# Patient Record
Sex: Female | Born: 1945 | Race: White | Hispanic: No | Smoking: Never smoker
Health system: Southern US, Community
[De-identification: ages and names within clinical notes are randomized; demographics above are authoritative.]

## PROBLEM LIST (undated history)

## (undated) DIAGNOSIS — G4700 Insomnia, unspecified: Secondary | ICD-10-CM

## (undated) DIAGNOSIS — I4891 Unspecified atrial fibrillation: Secondary | ICD-10-CM

## (undated) DIAGNOSIS — J45909 Unspecified asthma, uncomplicated: Secondary | ICD-10-CM

## (undated) DIAGNOSIS — R350 Frequency of micturition: Secondary | ICD-10-CM

## (undated) DIAGNOSIS — F32A Depression, unspecified: Secondary | ICD-10-CM

## (undated) DIAGNOSIS — E119 Type 2 diabetes mellitus without complications: Secondary | ICD-10-CM

## (undated) DIAGNOSIS — Z9989 Dependence on other enabling machines and devices: Secondary | ICD-10-CM

## (undated) DIAGNOSIS — I10 Essential (primary) hypertension: Secondary | ICD-10-CM

## (undated) DIAGNOSIS — R609 Edema, unspecified: Secondary | ICD-10-CM

## (undated) DIAGNOSIS — H9190 Unspecified hearing loss, unspecified ear: Secondary | ICD-10-CM

## (undated) DIAGNOSIS — N289 Disorder of kidney and ureter, unspecified: Secondary | ICD-10-CM

## (undated) DIAGNOSIS — K219 Gastro-esophageal reflux disease without esophagitis: Secondary | ICD-10-CM

## (undated) DIAGNOSIS — M109 Gout, unspecified: Secondary | ICD-10-CM

## (undated) DIAGNOSIS — J449 Chronic obstructive pulmonary disease, unspecified: Secondary | ICD-10-CM

## (undated) DIAGNOSIS — E7800 Pure hypercholesterolemia, unspecified: Secondary | ICD-10-CM

## (undated) DIAGNOSIS — M51369 Other intervertebral disc degeneration, lumbar region without mention of lumbar back pain or lower extremity pain: Secondary | ICD-10-CM

## (undated) DIAGNOSIS — N183 Chronic kidney disease, stage 3 unspecified: Secondary | ICD-10-CM

## (undated) DIAGNOSIS — F329 Major depressive disorder, single episode, unspecified: Secondary | ICD-10-CM

## (undated) DIAGNOSIS — E1140 Type 2 diabetes mellitus with diabetic neuropathy, unspecified: Secondary | ICD-10-CM

## (undated) DIAGNOSIS — I509 Heart failure, unspecified: Secondary | ICD-10-CM

## (undated) DIAGNOSIS — G4733 Obstructive sleep apnea (adult) (pediatric): Secondary | ICD-10-CM

## (undated) DIAGNOSIS — M5136 Other intervertebral disc degeneration, lumbar region: Secondary | ICD-10-CM

## (undated) DIAGNOSIS — M1990 Unspecified osteoarthritis, unspecified site: Secondary | ICD-10-CM

## (undated) HISTORY — PX: TONSILLECTOMY: SUR1361

## (undated) HISTORY — PX: ABDOMINAL HYSTERECTOMY: SHX81

---

## 1978-01-01 HISTORY — PX: ROTATOR CUFF REPAIR: SHX139

## 2007-10-13 ENCOUNTER — Encounter: Admission: RE | Admit: 2007-10-13 | Discharge: 2007-10-13 | Payer: Self-pay | Admitting: Orthopedic Surgery

## 2007-10-30 ENCOUNTER — Observation Stay (HOSPITAL_COMMUNITY): Admission: AD | Admit: 2007-10-30 | Discharge: 2007-11-01 | Payer: Self-pay | Admitting: Orthopedic Surgery

## 2007-10-30 ENCOUNTER — Other Ambulatory Visit: Payer: Self-pay | Admitting: Orthopedic Surgery

## 2010-05-16 NOTE — Op Note (Signed)
NAMECYERRA, Elaine Lee           ACCOUNT NO.:  [id number]   MEDICAL RECORD NO.:  [id number]          PATIENT TYPE:  AMB   LOCATION:  [facility]                         FACILITY:  [facility]   PHYSICIAN:  Deidre Ala, M.D.    DATE OF BIRTH:  [birth year]   DATE OF PROCEDURE:  10/30/2007  DATE OF DISCHARGE:                               OPERATIVE REPORT   PREOPERATIVE DIAGNOSES:  1. Left shoulder impingement with type III-IV acromion.  2. Complete rotator cuff tear by MRI.  3. Osteoarthritis AC (acromioclavicular) joint.   POSTOPERATIVE DIAGNOSES:  1. Left shoulder impingement with type III-IV acromion.  2. Complete retracted rotator cuff tear nonrepairable.  3. Torn frayed biceps tendon 50%.  4. Osteoarthritis AC joint.   PROCEDURES:  1. Left shoulder operative arthroscopy with subacromial arch      decompression acromioplasty.  2. Extensive debridement rotator cuff with biceps tenotomy and      synovectomy.  3. Arthroscopic distal clavicle resection.  4. Tuberosity plasty.   SURGEON:  1. Charlesetta Shanks, M.D.   ASSISTANT:  Phineas Semen, P.A.   ANESTHESIA:  General with scalene block with endotracheal.   CULTURES:  None.   DRAINS:  None.   ESTIMATED BLOOD LOSS:  Minimal.   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  She has had increasing shoulder pain.  She is grossly  overweight, high body mass index.  She basically went straight to an MRI  scan which showed completely retracted rotator cuff tear large full  thickness retracted with associated fatty atrophy of the supraspinatus  and infraspinatus muscles.  Posterior labral tear with a high-grade  proximal tear of the subscapularis tendon with mild subluxation of the  biceps tendon but no tear of the tendon.  Therefore she was elected to  take to the operating room for a thorough debridement as she was  miserable with discomfort.  She had a sharp anterior acromion.  At  surgery we found a  frayed biceps tendon 50% which we felt was a pain  generator, no SLAP detachment, grade I-II glenohumeral DJD.  She had a  completely retracted nonrepairable rotator cuff tear, sharp craggy  anterior acromion with obviously arthritic AC joint.  We debrided her to  [facility] margins on the acromion, distal clavicle, debrided the rotator  cuff edges, did a biceps tenotomy at the level of the labrum and overall  did a thorough debridement with tuberosity plasty.  She is not able to  function well with her shoulder being so painful.   PROCEDURE IN DETAIL:  With adequate anesthesia obtained using  endotracheal technique and scalene block the patient was placed in the  supine beach chair position.  The left shoulder was prepped and draped  in the standard fashion.  After standard prepping and draping skin  markings were made for anatomic positioning and 20 mL of 0.5% Marcaine  was injected in the subacromial space to open it up.  This was with  epinephrine.  The shoulder was then entered through a posterior portal.  Anterior portal was established just lateral to the  coracoid.  I then  probed and debrided the biceps tendon and then cut it with meniscus  scissors at the level of the labrum and allowed it to retract into the  groove.  I then thoroughly debrided the undersurface of the rotator  cuff, the superior labrum and smoothed with the ablator as well as the  anterior labrum and glenohumeral surface with the ablator on 1.  Portals  were reversed and similar shavings carried out.  I then entered the  subacromial space from the posterior portal.  Anterolateral portal was  established.  I then identified and shaved the anterior undersurface of  the acromion and used the ablator to cauterize.  I brought in the 6.0  bur and completed the acromioplasty to the roof of the subacromial space  in the manner of Caspari.  Bleeding points were cauterized which was  fairly heavy bleeding due to her  hypertension and inflammation.  The  scope was then turned medially sideways where through the anterior  portal I debrided with a basket the AC meniscus and completed distal  clavicle resection two shaver breadths in with a rotary bur.  I then  entered the shoulder through a lateral portal.  An additional lateral  posterolateral portal was made.  Debridement of the anterior leaf of the  subscapularis was then carried out as well as posteriorly with a partial  tuberosity plasty carried out.  Thorough irrigation was carried out.  The shoulder was then irrigated through the scope.  The portals were  closed with 4-0 nylon.  A bulky sterile compressive dressing was applied  with sling.  The patient then having tolerated the procedure well was  awakened and taken to the recovery room in satisfactory condition to be  discharged per outpatient routine and given Vicodin for pain due to  Percocet allergy and told to call the office for appointment for recheck  tomorrow.      Deidre Ala, M.D.  Electronically Signed     VEP/MEDQ  D:  10/30/2007  T:  10/30/2007  Job:  [id number]   cc:   Drucie Opitz  Fax: [phone]

## 2010-10-03 LAB — GLUCOSE, CAPILLARY
Glucose-Capillary: 109 — ABNORMAL HIGH
Glucose-Capillary: 138 — ABNORMAL HIGH
Glucose-Capillary: 230 — ABNORMAL HIGH
Glucose-Capillary: 258 — ABNORMAL HIGH

## 2010-10-03 LAB — POCT I-STAT 4, (NA,K, GLUC, HGB,HCT)
Glucose, Bld: 210 — ABNORMAL HIGH
Hemoglobin: 12.2
Potassium: 4.9

## 2014-06-13 ENCOUNTER — Inpatient Hospital Stay (HOSPITAL_COMMUNITY)
Admission: EM | Admit: 2014-06-13 | Discharge: 2014-06-16 | DRG: 190 | Disposition: A | Discharge status: Home / Self Care | Payer: Medicare (Managed Care) | Attending: Internal Medicine | Admitting: Internal Medicine

## 2014-06-13 ENCOUNTER — Emergency Department (HOSPITAL_COMMUNITY): Payer: Medicare (Managed Care)

## 2014-06-13 ENCOUNTER — Encounter (HOSPITAL_COMMUNITY): Payer: Self-pay

## 2014-06-13 DIAGNOSIS — E876 Hypokalemia: Secondary | ICD-10-CM | POA: Diagnosis present

## 2014-06-13 DIAGNOSIS — E1142 Type 2 diabetes mellitus with diabetic polyneuropathy: Secondary | ICD-10-CM | POA: Diagnosis present

## 2014-06-13 DIAGNOSIS — G2581 Restless legs syndrome: Secondary | ICD-10-CM

## 2014-06-13 DIAGNOSIS — Z794 Long term (current) use of insulin: Secondary | ICD-10-CM

## 2014-06-13 DIAGNOSIS — I509 Heart failure, unspecified: Secondary | ICD-10-CM | POA: Diagnosis present

## 2014-06-13 DIAGNOSIS — E1122 Type 2 diabetes mellitus with diabetic chronic kidney disease: Secondary | ICD-10-CM | POA: Diagnosis present

## 2014-06-13 DIAGNOSIS — G4733 Obstructive sleep apnea (adult) (pediatric): Secondary | ICD-10-CM | POA: Diagnosis present

## 2014-06-13 DIAGNOSIS — N179 Acute kidney failure, unspecified: Secondary | ICD-10-CM | POA: Diagnosis present

## 2014-06-13 DIAGNOSIS — M109 Gout, unspecified: Secondary | ICD-10-CM | POA: Diagnosis present

## 2014-06-13 DIAGNOSIS — F419 Anxiety disorder, unspecified: Secondary | ICD-10-CM | POA: Diagnosis present

## 2014-06-13 DIAGNOSIS — Z9981 Dependence on supplemental oxygen: Secondary | ICD-10-CM | POA: Diagnosis not present

## 2014-06-13 DIAGNOSIS — E785 Hyperlipidemia, unspecified: Secondary | ICD-10-CM | POA: Diagnosis present

## 2014-06-13 DIAGNOSIS — T380X5A Adverse effect of glucocorticoids and synthetic analogues, initial encounter: Secondary | ICD-10-CM | POA: Diagnosis not present

## 2014-06-13 DIAGNOSIS — Z9119 Patient's noncompliance with other medical treatment and regimen: Secondary | ICD-10-CM | POA: Diagnosis present

## 2014-06-13 DIAGNOSIS — J441 Chronic obstructive pulmonary disease with (acute) exacerbation: Secondary | ICD-10-CM | POA: Diagnosis present

## 2014-06-13 DIAGNOSIS — J9621 Acute and chronic respiratory failure with hypoxia: Secondary | ICD-10-CM | POA: Diagnosis present

## 2014-06-13 DIAGNOSIS — R0602 Shortness of breath: Secondary | ICD-10-CM | POA: Diagnosis present

## 2014-06-13 DIAGNOSIS — Z6841 Body Mass Index (BMI) 40.0 and over, adult: Secondary | ICD-10-CM | POA: Diagnosis not present

## 2014-06-13 DIAGNOSIS — E780 Pure hypercholesterolemia: Secondary | ICD-10-CM | POA: Diagnosis present

## 2014-06-13 DIAGNOSIS — K219 Gastro-esophageal reflux disease without esophagitis: Secondary | ICD-10-CM | POA: Diagnosis present

## 2014-06-13 DIAGNOSIS — R0600 Dyspnea, unspecified: Secondary | ICD-10-CM | POA: Diagnosis not present

## 2014-06-13 DIAGNOSIS — Z886 Allergy status to analgesic agent status: Secondary | ICD-10-CM | POA: Diagnosis not present

## 2014-06-13 DIAGNOSIS — K5900 Constipation, unspecified: Secondary | ICD-10-CM | POA: Diagnosis present

## 2014-06-13 DIAGNOSIS — N189 Chronic kidney disease, unspecified: Secondary | ICD-10-CM

## 2014-06-13 DIAGNOSIS — Z882 Allergy status to sulfonamides status: Secondary | ICD-10-CM | POA: Diagnosis not present

## 2014-06-13 DIAGNOSIS — R32 Unspecified urinary incontinence: Secondary | ICD-10-CM

## 2014-06-13 DIAGNOSIS — N183 Chronic kidney disease, stage 3 unspecified: Secondary | ICD-10-CM | POA: Diagnosis present

## 2014-06-13 DIAGNOSIS — I4891 Unspecified atrial fibrillation: Secondary | ICD-10-CM

## 2014-06-13 DIAGNOSIS — Z91040 Latex allergy status: Secondary | ICD-10-CM

## 2014-06-13 DIAGNOSIS — G4700 Insomnia, unspecified: Secondary | ICD-10-CM | POA: Diagnosis present

## 2014-06-13 DIAGNOSIS — H9190 Unspecified hearing loss, unspecified ear: Secondary | ICD-10-CM | POA: Diagnosis present

## 2014-06-13 DIAGNOSIS — R609 Edema, unspecified: Secondary | ICD-10-CM

## 2014-06-13 DIAGNOSIS — R300 Dysuria: Secondary | ICD-10-CM | POA: Diagnosis present

## 2014-06-13 DIAGNOSIS — M1990 Unspecified osteoarthritis, unspecified site: Secondary | ICD-10-CM | POA: Diagnosis present

## 2014-06-13 DIAGNOSIS — Z7901 Long term (current) use of anticoagulants: Secondary | ICD-10-CM

## 2014-06-13 DIAGNOSIS — I129 Hypertensive chronic kidney disease with stage 1 through stage 4 chronic kidney disease, or unspecified chronic kidney disease: Secondary | ICD-10-CM | POA: Diagnosis present

## 2014-06-13 DIAGNOSIS — Z888 Allergy status to other drugs, medicaments and biological substances status: Secondary | ICD-10-CM

## 2014-06-13 DIAGNOSIS — F329 Major depressive disorder, single episode, unspecified: Secondary | ICD-10-CM | POA: Diagnosis present

## 2014-06-13 DIAGNOSIS — J45909 Unspecified asthma, uncomplicated: Secondary | ICD-10-CM | POA: Diagnosis present

## 2014-06-13 DIAGNOSIS — E119 Type 2 diabetes mellitus without complications: Secondary | ICD-10-CM

## 2014-06-13 DIAGNOSIS — I10 Essential (primary) hypertension: Secondary | ICD-10-CM | POA: Diagnosis present

## 2014-06-13 DIAGNOSIS — M792 Neuralgia and neuritis, unspecified: Secondary | ICD-10-CM | POA: Diagnosis present

## 2014-06-13 DIAGNOSIS — Z950 Presence of cardiac pacemaker: Secondary | ICD-10-CM

## 2014-06-13 DIAGNOSIS — F418 Other specified anxiety disorders: Secondary | ICD-10-CM

## 2014-06-13 HISTORY — DX: Gastro-esophageal reflux disease without esophagitis: K21.9

## 2014-06-13 HISTORY — DX: Disorder of kidney and ureter, unspecified: N28.9

## 2014-06-13 HISTORY — DX: Unspecified asthma, uncomplicated: J45.909

## 2014-06-13 HISTORY — DX: Edema, unspecified: R60.9

## 2014-06-13 HISTORY — DX: Dependence on other enabling machines and devices: Z99.89

## 2014-06-13 HISTORY — DX: Insomnia, unspecified: G47.00

## 2014-06-13 HISTORY — DX: Other intervertebral disc degeneration, lumbar region without mention of lumbar back pain or lower extremity pain: M51.369

## 2014-06-13 HISTORY — DX: Pure hypercholesterolemia, unspecified: E78.00

## 2014-06-13 HISTORY — DX: Unspecified atrial fibrillation: I48.91

## 2014-06-13 HISTORY — DX: Gout, unspecified: M10.9

## 2014-06-13 HISTORY — DX: Essential (primary) hypertension: I10

## 2014-06-13 HISTORY — DX: Other intervertebral disc degeneration, lumbar region: M51.36

## 2014-06-13 HISTORY — DX: Depression, unspecified: F32.A

## 2014-06-13 HISTORY — DX: Chronic kidney disease, stage 3 unspecified: N18.30

## 2014-06-13 HISTORY — DX: Frequency of micturition: R35.0

## 2014-06-13 HISTORY — DX: Heart failure, unspecified: I50.9

## 2014-06-13 HISTORY — DX: Obstructive sleep apnea (adult) (pediatric): G47.33

## 2014-06-13 HISTORY — DX: Unspecified osteoarthritis, unspecified site: M19.90

## 2014-06-13 HISTORY — DX: Chronic kidney disease, stage 3 (moderate): N18.3

## 2014-06-13 HISTORY — DX: Type 2 diabetes mellitus without complications: E11.9

## 2014-06-13 HISTORY — DX: Unspecified hearing loss, unspecified ear: H91.90

## 2014-06-13 HISTORY — DX: Type 2 diabetes mellitus with diabetic neuropathy, unspecified: E11.40

## 2014-06-13 HISTORY — DX: Major depressive disorder, single episode, unspecified: F32.9

## 2014-06-13 HISTORY — DX: Chronic obstructive pulmonary disease, unspecified: J44.9

## 2014-06-13 LAB — CBC WITH DIFFERENTIAL/PLATELET
BASOS ABS: 0 10*3/uL (ref 0.0–0.1)
BASOS PCT: 0 % (ref 0–1)
EOS PCT: 6 % — AB (ref 0–5)
Eosinophils Absolute: 0.5 10*3/uL (ref 0.0–0.7)
HEMATOCRIT: 33 % — AB (ref 36.0–46.0)
Hemoglobin: 10.3 g/dL — ABNORMAL LOW (ref 12.0–15.0)
LYMPHS ABS: 3.4 10*3/uL (ref 0.7–4.0)
LYMPHS PCT: 37 % (ref 12–46)
MCH: 27.2 pg (ref 26.0–34.0)
MCHC: 31.2 g/dL (ref 30.0–36.0)
MCV: 87.3 fL (ref 78.0–100.0)
MONO ABS: 1.3 10*3/uL — AB (ref 0.1–1.0)
Monocytes Relative: 14 % — ABNORMAL HIGH (ref 3–12)
NEUTROS ABS: 4.1 10*3/uL (ref 1.7–7.7)
Neutrophils Relative %: 43 % (ref 43–77)
Platelets: 253 10*3/uL (ref 150–400)
RBC: 3.78 MIL/uL — AB (ref 3.87–5.11)
RDW: 15.6 % — ABNORMAL HIGH (ref 11.5–15.5)
WBC: 9.4 10*3/uL (ref 4.0–10.5)

## 2014-06-13 LAB — COMPREHENSIVE METABOLIC PANEL
ALK PHOS: 69 U/L (ref 38–126)
ALT: 13 U/L — ABNORMAL LOW (ref 14–54)
ANION GAP: 9 (ref 5–15)
AST: 16 U/L (ref 15–41)
Albumin: 3.4 g/dL — ABNORMAL LOW (ref 3.5–5.0)
BUN: 13 mg/dL (ref 6–20)
CO2: 34 mmol/L — ABNORMAL HIGH (ref 22–32)
Calcium: 9.5 mg/dL (ref 8.9–10.3)
Chloride: 98 mmol/L — ABNORMAL LOW (ref 101–111)
Creatinine, Ser: 1.09 mg/dL — ABNORMAL HIGH (ref 0.44–1.00)
GFR, EST AFRICAN AMERICAN: 58 mL/min — AB (ref >60)
GFR, EST NON AFRICAN AMERICAN: 50 mL/min — AB (ref >60)
Glucose, Bld: 66 mg/dL (ref 65–99)
POTASSIUM: 3 mmol/L — AB (ref 3.5–5.1)
Sodium: 141 mmol/L (ref 135–145)
Total Bilirubin: 0.4 mg/dL (ref 0.3–1.2)
Total Protein: 6.9 g/dL (ref 6.5–8.1)

## 2014-06-13 LAB — GLUCOSE, CAPILLARY: Glucose-Capillary: 259 mg/dL — ABNORMAL HIGH (ref 65–99)

## 2014-06-13 LAB — I-STAT TROPONIN, ED: TROPONIN I, POC: 0 ng/mL (ref 0.00–0.08)

## 2014-06-13 LAB — BRAIN NATRIURETIC PEPTIDE: B Natriuretic Peptide: 118.3 pg/mL — ABNORMAL HIGH (ref 0.0–100.0)

## 2014-06-13 MED ORDER — INSULIN ASPART 100 UNIT/ML ~~LOC~~ SOLN
0.0000 [IU] | Freq: Three times a day (TID) | SUBCUTANEOUS | Status: DC
  Administered 2014-06-14: 7 [IU] via SUBCUTANEOUS
  Administered 2014-06-14: 9 [IU] via SUBCUTANEOUS

## 2014-06-13 MED ORDER — FUROSEMIDE 10 MG/ML IJ SOLN
60.0000 mg | Freq: Once | INTRAMUSCULAR | Status: AC
  Administered 2014-06-14: 60 mg via INTRAVENOUS
  Filled 2014-06-13: qty 6

## 2014-06-13 MED ORDER — GABAPENTIN 400 MG PO CAPS
700.0000 mg | ORAL_CAPSULE | Freq: Two times a day (BID) | ORAL | Status: DC
  Administered 2014-06-13 – 2014-06-16 (×6): 700 mg via ORAL
  Filled 2014-06-13 (×7): qty 1

## 2014-06-13 MED ORDER — METHYLPREDNISOLONE SODIUM SUCC 125 MG IJ SOLR
125.0000 mg | Freq: Once | INTRAMUSCULAR | Status: AC
  Administered 2014-06-13: 125 mg via INTRAVENOUS
  Filled 2014-06-13: qty 2

## 2014-06-13 MED ORDER — MAGNESIUM OXIDE 400 (241.3 MG) MG PO TABS
200.0000 mg | ORAL_TABLET | Freq: Every day | ORAL | Status: DC
  Administered 2014-06-14 – 2014-06-16 (×3): 200 mg via ORAL
  Filled 2014-06-13 (×3): qty 0.5

## 2014-06-13 MED ORDER — DILTIAZEM HCL ER COATED BEADS 240 MG PO CP24
240.0000 mg | ORAL_CAPSULE | Freq: Every day | ORAL | Status: DC
  Administered 2014-06-14 – 2014-06-16 (×3): 240 mg via ORAL
  Filled 2014-06-13 (×3): qty 1

## 2014-06-13 MED ORDER — PREDNISONE 20 MG PO TABS
40.0000 mg | ORAL_TABLET | Freq: Every day | ORAL | Status: DC
  Administered 2014-06-14: 40 mg via ORAL
  Filled 2014-06-13 (×2): qty 2

## 2014-06-13 MED ORDER — WHITE PETROLATUM GEL
Status: DC | PRN
  Filled 2014-06-13: qty 28.35

## 2014-06-13 MED ORDER — GUAIFENESIN-CODEINE 100-10 MG/5ML PO SYRP: 5.0000 mL | ORAL_SOLUTION | Freq: Four times a day (QID) | ORAL | Status: DC | PRN

## 2014-06-13 MED ORDER — ALBUTEROL (5 MG/ML) CONTINUOUS INHALATION SOLN
10.0000 mg/h | INHALATION_SOLUTION | Freq: Once | RESPIRATORY_TRACT | Status: AC
  Administered 2014-06-13: 10 mg/h via RESPIRATORY_TRACT
  Filled 2014-06-13: qty 20

## 2014-06-13 MED ORDER — APIXABAN 5 MG PO TABS
5.0000 mg | ORAL_TABLET | Freq: Two times a day (BID) | ORAL | Status: DC
  Administered 2014-06-13 – 2014-06-16 (×6): 5 mg via ORAL
  Filled 2014-06-13 (×7): qty 1

## 2014-06-13 MED ORDER — GUAIFENESIN-CODEINE 100-10 MG/5ML PO SOLN
5.0000 mL | Freq: Four times a day (QID) | ORAL | Status: DC | PRN
  Administered 2014-06-13 – 2014-06-14 (×3): 5 mL via ORAL
  Filled 2014-06-13 (×3): qty 5

## 2014-06-13 MED ORDER — ACETAMINOPHEN 325 MG PO TABS
975.0000 mg | ORAL_TABLET | Freq: Two times a day (BID) | ORAL | Status: DC
  Administered 2014-06-13 – 2014-06-16 (×6): 975 mg via ORAL
  Filled 2014-06-13 (×6): qty 3

## 2014-06-13 MED ORDER — BUDESONIDE-FORMOTEROL FUMARATE 160-4.5 MCG/ACT IN AERO
2.0000 | INHALATION_SPRAY | Freq: Two times a day (BID) | RESPIRATORY_TRACT | Status: DC
  Administered 2014-06-14: 2 via RESPIRATORY_TRACT
  Filled 2014-06-13: qty 6

## 2014-06-13 MED ORDER — SODIUM CHLORIDE 0.9 % IJ SOLN
3.0000 mL | Freq: Two times a day (BID) | INTRAMUSCULAR | Status: DC
  Administered 2014-06-13 – 2014-06-16 (×6): 3 mL via INTRAVENOUS

## 2014-06-13 MED ORDER — THEOPHYLLINE ER 300 MG PO TB12
300.0000 mg | ORAL_TABLET | Freq: Every day | ORAL | Status: DC
  Administered 2014-06-14 – 2014-06-16 (×3): 300 mg via ORAL
  Filled 2014-06-13 (×3): qty 1

## 2014-06-13 MED ORDER — GABAPENTIN 800 MG PO TABS: 700.0000 mg | ORAL_TABLET | Freq: Two times a day (BID) | ORAL | Status: DC

## 2014-06-13 MED ORDER — IPRATROPIUM-ALBUTEROL 0.5-2.5 (3) MG/3ML IN SOLN
3.0000 mL | RESPIRATORY_TRACT | Status: DC
  Administered 2014-06-14: 3 mL via RESPIRATORY_TRACT
  Filled 2014-06-13: qty 3

## 2014-06-13 MED ORDER — CLONAZEPAM 0.5 MG PO TABS
2.0000 mg | ORAL_TABLET | Freq: Every day | ORAL | Status: DC
  Administered 2014-06-13 – 2014-06-15 (×3): 2 mg via ORAL
  Filled 2014-06-13 (×3): qty 4

## 2014-06-13 MED ORDER — ALLOPURINOL 300 MG PO TABS
300.0000 mg | ORAL_TABLET | Freq: Every day | ORAL | Status: DC
  Administered 2014-06-14 – 2014-06-16 (×3): 300 mg via ORAL
  Filled 2014-06-13 (×3): qty 1

## 2014-06-13 MED ORDER — ATORVASTATIN CALCIUM 40 MG PO TABS
40.0000 mg | ORAL_TABLET | Freq: Every day | ORAL | Status: DC
  Administered 2014-06-13 – 2014-06-15 (×3): 40 mg via ORAL
  Filled 2014-06-13 (×4): qty 1

## 2014-06-13 MED ORDER — FLUOXETINE HCL 20 MG PO CAPS
40.0000 mg | ORAL_CAPSULE | Freq: Every day | ORAL | Status: DC
  Administered 2014-06-14 – 2014-06-16 (×3): 40 mg via ORAL
  Filled 2014-06-13 (×3): qty 2

## 2014-06-13 MED ORDER — PANTOPRAZOLE SODIUM 40 MG PO TBEC
80.0000 mg | DELAYED_RELEASE_TABLET | Freq: Every day | ORAL | Status: DC
  Administered 2014-06-14 – 2014-06-16 (×3): 80 mg via ORAL
  Filled 2014-06-13 (×3): qty 2

## 2014-06-13 MED ORDER — SODIUM CHLORIDE 0.9 % IV BOLUS (SEPSIS)
250.0000 mL | Freq: Once | INTRAVENOUS | Status: AC
  Administered 2014-06-13: 250 mL via INTRAVENOUS

## 2014-06-13 MED ORDER — TROSPIUM CHLORIDE ER 60 MG PO CP24
60.0000 mg | ORAL_CAPSULE | Freq: Every day | ORAL | Status: DC
  Administered 2014-06-16: 60 mg via ORAL

## 2014-06-13 MED ORDER — LORATADINE 10 MG PO TABS
10.0000 mg | ORAL_TABLET | Freq: Every morning | ORAL | Status: DC
  Administered 2014-06-14 – 2014-06-16 (×3): 10 mg via ORAL
  Filled 2014-06-13 (×3): qty 1

## 2014-06-13 MED ORDER — INSULIN ASPART PROT & ASPART (70-30 MIX) 100 UNIT/ML ~~LOC~~ SUSP
30.0000 [IU] | Freq: Two times a day (BID) | SUBCUTANEOUS | Status: DC
  Administered 2014-06-14: 30 [IU] via SUBCUTANEOUS
  Filled 2014-06-13: qty 10

## 2014-06-13 NOTE — ED Notes (Signed)
Transporting patient to new room assignment. 

## 2014-06-13 NOTE — H&P (Signed)
Date: 06/13/2014               Patient Name:  Elaine Lee MRN: [id number]  DOB: [birth year] Age / Sex: 69 y.o., female   PCP: No primary care provider on file.         Medical Service: Internal Medicine Teaching Service         Attending Physician: Dr. Doneen Poisson, MD    First Contact: Dr. Glenard Haring Pager: [phone]  Second Contact: Dr. Delane Ginger Pager: [phone]       After Hours (After 5p/  First Contact Pager: [phone]  weekends / holidays): Second Contact Pager: [phone]   Chief Complaint: shortness of breath  History of Present Illness: Pt is a 69 y/o F w/ PMHx of COPD, CHF, DM, OSA, atrial fibrillation on eliquis, HTN, and CKD who presents with shortness of breath. Pt states for the past 2 days she has had increased cough with yellow sputum production and SOB.  Due to cough pt has developed chest pain that she experiences only when she is coughing. SOB did not improve with her home inhalers. She has SOB all of the time but has increased worsening over the past 2 days. She has had issues with leg swelling and states swelling is now up to her thighs. Leg swelling has worsened over the past week. She has orthopnea and sleeps on a hospital bed w/ the head elevated for the past few years. She is on 3L home O2. She has hx of frequent admission for CHF exacerbations and has been seen at [facility] for this, last admission in March. She follows with Dr. Gerome Apley w/ pulmonology but has stopped seeing him since she joined [facility] in April. Pt is also complaining of dysuria, increased urinary incontinence, and foul odor to urine x 2-3 days. CXR in the ED reveals vascular congestion with mildly increased interstitial markings. She was given IV solumedrol and a breathing tx in the ED.   Meds: Current Facility-Administered Medications  Medication Dose Route Frequency Provider Last Rate Last Dose  . white petrolatum (VASELINE) gel   Topical PRN Purvis Sheffield, MD        Allergies: Allergies  as of 06/13/2014 - Review Complete 06/13/2014  Allergen Reaction Noted  . Other Shortness Of Breath 06/13/2014  . Tape Itching and Rash 06/13/2014  . Celebrex [celecoxib] Other (See Comments) 06/13/2014  . Latex Hives, Itching, and Rash 06/13/2014  . Mysoline [primidone] Other (See Comments) 06/13/2014  . Niacin and related Hives and Swelling 06/13/2014  . Sulfa antibiotics Other (See Comments) 06/13/2014  . Aspirin Other (See Comments) 06/13/2014   Past Medical History  Diagnosis Date  . Diabetes mellitus without complication   . CHF (congestive heart failure)   . Renal disorder   . COPD (chronic obstructive pulmonary disease)   . Hypertension   . Asthma   . Obstructive sleep apnea on CPAP   . A-fib   . Hypercholesteremia   . Chronic edema   . CKD (chronic kidney disease), stage III   . Diabetic neuropathy   . Degenerative lumbar disc   . Osteoarthritis     knees and shoulders  . Gout   . GERD (gastroesophageal reflux disease)   . Depression   . Hearing loss   . Urinary frequency   . Insomnia    History reviewed. No pertinent past surgical history. History reviewed. No pertinent family history. History   Social History  . Marital Status: Married  Spouse Name: N/A  . Number of Children: N/A  . Years of Education: N/A   Occupational History  . Not on file.   Social History Main Topics  . Smoking status: Never Smoker   . Smokeless tobacco: Not on file  . Alcohol Use: No  . Drug Use: Not on file  . Sexual Activity: Not on file   Other Topics Concern  . Not on file   Social History Narrative  . No narrative on file    Review of Systems  Constitutional: Positive for chills. Negative for fever.  HENT:       Sinus pressure   Respiratory: Positive for cough, shortness of breath and wheezing.   Cardiovascular: Positive for chest pain (only when coughing), orthopnea and leg swelling.  Gastrointestinal: Negative for nausea, vomiting and abdominal pain.   Genitourinary: Positive for dysuria and frequency.  Neurological: Positive for headaches.     Physical Exam: Blood pressure 125/60, pulse 113, temperature 98.2 F (36.8 C), temperature source Oral, resp. rate 20, height 5\' 5"  (1.651 m), weight 328 lb 6.4 oz (148.961 kg), SpO2 97 %.  Physical Exam  Constitutional: She appears well-developed and well-nourished. No distress.  Eyes: Conjunctivae are normal. Pupils are equal, round, and reactive to light.  Cardiovascular: Normal rate and regular rhythm.   No murmur heard. Pulmonary/Chest: She has wheezes.  Rhonchi, coarse breath sounds   Abdominal: Soft. Bowel sounds are normal.  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  Skin: Skin is warm and dry.    Lab results: Basic Metabolic Panel:  Recent Labs  09/32/67 1712  NA 141  K 3.0*  CL 98*  CO2 34*  GLUCOSE 66  BUN 13  CREATININE 1.09*  CALCIUM 9.5   Liver Function Tests:  Recent Labs  06/13/14 1712  AST 16  ALT 13*  ALKPHOS 69  BILITOT 0.4  PROT 6.9  ALBUMIN 3.4*   CBC:  Recent Labs  06/13/14 1712  WBC 9.4  NEUTROABS 4.1  HGB 10.3*  HCT 33.0*  MCV 87.3  PLT 253    CBG:  Recent Labs  06/13/14 2103  GLUCAP 259*    Imaging results:  Dg Chest 2 View  06/13/2014   CLINICAL DATA:  Acute onset of shortness of breath and cough. Initial encounter.  EXAM: CHEST  2 VIEW  COMPARISON:  Chest radiograph performed 03/30/2014  FINDINGS: The lungs are well-aerated. Vascular congestion is noted, with increased interstitial markings, likely reflecting mild interstitial edema. No definite pleural effusion or pneumothorax is seen.  The heart is mildly enlarged. No acute osseous abnormalities are seen. The patient is status post right-sided rotator cuff repair.  IMPRESSION: Vascular congestion and mild cardiomegaly, with mildly increased interstitial markings, likely reflecting mild interstitial  edema.   Electronically Signed   By: Roanna Raider M.D.   On: 06/13/2014 17:50    EKG Interpretation  Date/Time:  Sunday June 13 2014 16:23:58 EDT Ventricular Rate:  83 PR Interval:    QRS Duration: 99 QT Interval:  396 QTC Calculation: 465 R Axis:   -49 Text Interpretation:  Atrial fibrillation Left anterior fascicular block Low voltage, precordial leads Nonspecific T abnrm, anterolateral leads Confirmed by HARRISON  MD, FORREST ([id number]) on 06/13/2014 4:36:02 PM T wave flattening in V3.    Assessment & Plan by Problem: Active Problems:   COPD exacerbation   Shortness of breath--Likely a combination of mild CHF exacerbation  and COPD. CXR revealed mild interstitial edema, BNP elevated at 118.3, and pt has increased leg edema. On admission her weight was 328lbs. Pt states her normal weight is around 295, she has a home health nurse who checks her weight 3x a week but she has not seen pt since last week. Unable to auscultate crackle on lung exam due body habitus, she did have coarse breath sounds with expiratory wheezing concerning for COPD exacerbation. She has never smoked but states she was dx with COPD by Dr. Gerome Apley and referred to pulmonary rehab. States when she is at [facility] pts there may be sick but otherwise denies sick contacts in her house hold. On lasix  BID at home. She was given given albuterol neb and solumedrol  in the ED. - admit to tele - IV lasix  once - strict I/O's, daily weights - cont albuterol inhaler,symbicort, and theophylline - duonebs q4h - prednisone  daily starting tomorrow - trend troponins - ECHO - re-eval CCB use in heart failure - muccinex for cough   Dysuria-- w/ inc frequency and foul odor - UA ordered  HTN - hold losartan  daily  Atrial fibrillation - cont eliquis and cardizem  daily  DM w/ peripheral neuropathy-- on 70/30 45 units in the am and 48 units in the PM at home - 70/30 30 units BID - SSI sensitive -  gabapentin  BID which is the renal dose, at home she is on  BID - hbA1c  HLD-- cont atorvastatin  daily   CKD--GFR 50, Cr 1.09 w/ no priors to compare.  - will cont to monitor  OSA-- BIPAP qhs  Gout-- cont allopurinol  Depression, anxiety, and RLS - cont prozac , klonopin  qhs  Insomnia - hold ambien   Urinary incontinence-- cont home trospium chloride  daily  GERD-- on prilosec  at home - protonix  qd   FEN: - Diet- hh/carb mod - KDur 40 mEq x 2 doses - cont home mag oxide  daily  DVT ppx: eliquis  CODE: FULL, discussed w/ pt at bedside  Dispo: Disposition is deferred at this time, awaiting improvement of current medical problems.  The patient does have a current PCP ([facility] pt) and does not need an [facility] hospital follow-up appointment after discharge.  The patient does not have transportation limitations that hinder transportation to clinic appointments.  Signed: Denton Brick, MD 06/13/2014, 10:23 PM

## 2014-06-13 NOTE — ED Notes (Signed)
Per EMS: Pt from urgent care, did not assess pt, called EMS. Pt of [facility]. Pt complaining of increased swelling in legs x several weeks. Hx. COPD. Pt coughing up yellow phlem all night. Bilateral wheezing and leg swelling. EMS gave albuterol via neb.

## 2014-06-13 NOTE — ED Provider Notes (Signed)
CSN: [id number]     Arrival date & time 06/13/14  1533 History   First MD Initiated Contact with Patient 06/13/14 1551     Chief Complaint  Patient presents with  . Shortness of Breath     (Consider location/radiation/quality/duration/timing/severity/associated sxs/prior Treatment) Patient is a 69 y.o. female presenting with shortness of breath. The history is provided by the patient.  Shortness of Breath Severity:  Mild Onset quality:  Gradual Timing:  Constant Progression:  Worsening Chronicity:  Recurrent Context: activity and URI   Relieved by:  Nothing Worsened by:  Nothing tried Ineffective treatments:  Inhaler, rest and oxygen Associated symptoms: cough   Associated symptoms: no abdominal pain, no chest pain, no fever, no headaches, no neck pain and no vomiting   Cough:    Cough characteristics:  Productive   Sputum characteristics:  Yellow   Severity:  Mild   Past Medical History  Diagnosis Date  . Diabetes mellitus without complication   . CHF (congestive heart failure)   . Renal disorder   . COPD (chronic obstructive pulmonary disease)   . Hypertension   . Asthma   . Obstructive sleep apnea on CPAP   . A-fib   . Hypercholesteremia   . Chronic edema    History reviewed. No pertinent past surgical history. History reviewed. No pertinent family history. History  Substance Use Topics  . Smoking status: Never Smoker   . Smokeless tobacco: Not on file  . Alcohol Use: No   OB History    No data available     Review of Systems  Constitutional: Positive for chills. Negative for fever and fatigue.  HENT: Negative for congestion and drooling.   Eyes: Negative for pain.  Respiratory: Positive for cough, chest tightness and shortness of breath.   Cardiovascular: Negative for chest pain.  Gastrointestinal: Negative for nausea, vomiting, abdominal pain and diarrhea.  Genitourinary: Negative for dysuria and hematuria.  Musculoskeletal: Negative for back pain,  gait problem and neck pain.       Worsening edema  Skin: Negative for color change.  Neurological: Negative for dizziness and headaches.  Hematological: Negative for adenopathy.  Psychiatric/Behavioral: Negative for behavioral problems.  All other systems reviewed and are negative.     Allergies  Aspirin; Celebrex; Latex; Mysoline; Niacin and related; Sulfa antibiotics; and Tape  Home Medications   Prior to Admission medications   Not on File   BP 92/43 mmHg  Pulse 87  Temp(Src) 97.8 F (36.6 C)  Resp 14  SpO2 95% Physical Exam  Constitutional: She is oriented to person, place, and time. She appears well-developed and well-nourished.  HENT:  Head: Normocephalic.  Mouth/Throat: Oropharynx is clear and moist. No oropharyngeal exudate.  Eyes: Conjunctivae and EOM are normal. Pupils are equal, round, and reactive to light.  Neck: Normal range of motion. Neck supple.  Cardiovascular: Normal rate, normal heart sounds and intact distal pulses.  Exam reveals no gallop and no friction rub.   No murmur heard. afib   Pulmonary/Chest: No respiratory distress. She has wheezes (diffuse mild wheezing heard bilaterally.).  Abdominal: Soft. Bowel sounds are normal. There is no tenderness. There is no rebound and no guarding.  Musculoskeletal: Normal range of motion. She exhibits edema (moderate edema in the distal lower extremities.). She exhibits no tenderness.  2+ distal pulses in bilateral lower extremities.  Neurological: She is alert and oriented to person, place, and time.  Skin: Skin is warm and dry.  Psychiatric: She has a normal mood and  affect. Her behavior is normal.  Nursing note and vitals reviewed.   ED Course  Procedures (including critical care time) Labs Review Labs Reviewed  CBC WITH DIFFERENTIAL/PLATELET  COMPREHENSIVE METABOLIC PANEL  BRAIN NATRIURETIC PEPTIDE  I-STAT TROPOININ, ED    Imaging Review No results found.   EKG Interpretation   Date/Time:   Sunday June 13 2014 16:23:58 EDT Ventricular Rate:  83 PR Interval:    QRS Duration: 99 QT Interval:  396 QTC Calculation: 465 R Axis:   -49 Text Interpretation:  Atrial fibrillation Left anterior fascicular block  Low voltage, precordial leads Nonspecific T abnrm, anterolateral leads  Confirmed by Parveen Freehling  MD, Jaeger Trueheart ([id number]) on 06/13/2014 4:36:02 PM      MDM   Final diagnoses:  SOB (shortness of breath)  COPD exacerbation  Interstitial edema  Hypokalemia    4:43 PM 69 y.o. female w hx of DM, CHF, COPD on 2-3L [place] at baseline, afib on eliquis who presents with worsening shortness of breath over the last few days. She also notes cough productive of yellow sputum. She denies any fevers. She has had wheezing and chest tightness not relieved with her nebulizers at home. She was seen at an urgent care prior to arrival and referred here without any workup due to her multiple comorbidities. Afebrile here, blood pressure slightly soft, vital signs otherwise unremarkable. Diffuse wheezing heard. We'll get albuterol med, steroids, 250 mL bolus, screening labs and imaging.  [facility] pt. IM teaching to admit.   Purvis Sheffield, MD 06/14/14 1155

## 2014-06-14 ENCOUNTER — Inpatient Hospital Stay (HOSPITAL_COMMUNITY): Payer: Medicare (Managed Care)

## 2014-06-14 DIAGNOSIS — N183 Chronic kidney disease, stage 3 unspecified: Secondary | ICD-10-CM | POA: Insufficient documentation

## 2014-06-14 DIAGNOSIS — M792 Neuralgia and neuritis, unspecified: Secondary | ICD-10-CM | POA: Diagnosis present

## 2014-06-14 DIAGNOSIS — I509 Heart failure, unspecified: Secondary | ICD-10-CM

## 2014-06-14 DIAGNOSIS — Z9119 Patient's noncompliance with other medical treatment and regimen: Secondary | ICD-10-CM

## 2014-06-14 DIAGNOSIS — E119 Type 2 diabetes mellitus without complications: Secondary | ICD-10-CM

## 2014-06-14 DIAGNOSIS — K5900 Constipation, unspecified: Secondary | ICD-10-CM

## 2014-06-14 DIAGNOSIS — E1122 Type 2 diabetes mellitus with diabetic chronic kidney disease: Secondary | ICD-10-CM | POA: Insufficient documentation

## 2014-06-14 DIAGNOSIS — I10 Essential (primary) hypertension: Secondary | ICD-10-CM | POA: Diagnosis present

## 2014-06-14 DIAGNOSIS — R0600 Dyspnea, unspecified: Secondary | ICD-10-CM

## 2014-06-14 LAB — BASIC METABOLIC PANEL
ANION GAP: 13 (ref 5–15)
BUN: 22 mg/dL — ABNORMAL HIGH (ref 6–20)
CO2: 28 mmol/L (ref 22–32)
CREATININE: 1.43 mg/dL — AB (ref 0.44–1.00)
Calcium: 9.6 mg/dL (ref 8.9–10.3)
Chloride: 96 mmol/L — ABNORMAL LOW (ref 101–111)
GFR calc non Af Amer: 36 mL/min — ABNORMAL LOW (ref >60)
GFR, EST AFRICAN AMERICAN: 42 mL/min — AB (ref >60)
Glucose, Bld: 408 mg/dL — ABNORMAL HIGH (ref 65–99)
Potassium: 3.6 mmol/L (ref 3.5–5.1)
Sodium: 137 mmol/L (ref 135–145)

## 2014-06-14 LAB — URINALYSIS, ROUTINE W REFLEX MICROSCOPIC
Bilirubin Urine: NEGATIVE
Glucose, UA: 100 mg/dL — AB
HGB URINE DIPSTICK: NEGATIVE
Ketones, ur: NEGATIVE mg/dL
Leukocytes, UA: NEGATIVE
Nitrite: NEGATIVE
PH: 6 (ref 5.0–8.0)
Protein, ur: NEGATIVE mg/dL
Specific Gravity, Urine: 1.014 (ref 1.005–1.030)
UROBILINOGEN UA: 0.2 mg/dL (ref 0.0–1.0)

## 2014-06-14 LAB — MAGNESIUM: Magnesium: 1.8 mg/dL (ref 1.7–2.4)

## 2014-06-14 LAB — GLUCOSE, CAPILLARY
GLUCOSE-CAPILLARY: 383 mg/dL — AB (ref 65–99)
GLUCOSE-CAPILLARY: 345 mg/dL — AB (ref 65–99)
GLUCOSE-CAPILLARY: 236 mg/dL — AB (ref 65–99)
Glucose-Capillary: 344 mg/dL — ABNORMAL HIGH (ref 65–99)

## 2014-06-14 LAB — TROPONIN I
Troponin I: <0.03 ng/mL (ref <0.031)
Troponin I: <0.03 ng/mL (ref <0.031)

## 2014-06-14 MED ORDER — INSULIN ASPART PROT & ASPART (70-30 MIX) 100 UNIT/ML ~~LOC~~ SUSP
40.0000 [IU] | Freq: Two times a day (BID) | SUBCUTANEOUS | Status: DC
  Administered 2014-06-14: 40 [IU] via SUBCUTANEOUS
  Filled 2014-06-14: qty 10

## 2014-06-14 MED ORDER — IPRATROPIUM-ALBUTEROL 0.5-2.5 (3) MG/3ML IN SOLN: 3.0000 mL | RESPIRATORY_TRACT | Status: DC | PRN

## 2014-06-14 MED ORDER — CEFUROXIME AXETIL 500 MG PO TABS
500.0000 mg | ORAL_TABLET | Freq: Two times a day (BID) | ORAL | Status: DC
  Administered 2014-06-15 – 2014-06-16 (×3): 500 mg via ORAL
  Filled 2014-06-14 (×5): qty 1

## 2014-06-14 MED ORDER — BUDESONIDE 0.25 MG/2ML IN SUSP
0.2500 mg | Freq: Two times a day (BID) | RESPIRATORY_TRACT | Status: DC
  Administered 2014-06-15 – 2014-06-16 (×2): 0.25 mg via RESPIRATORY_TRACT
  Filled 2014-06-14 (×6): qty 2

## 2014-06-14 MED ORDER — POTASSIUM CHLORIDE CRYS ER 20 MEQ PO TBCR
40.0000 meq | EXTENDED_RELEASE_TABLET | Freq: Two times a day (BID) | ORAL | Status: AC
  Administered 2014-06-14 (×2): 40 meq via ORAL
  Filled 2014-06-14 (×2): qty 2

## 2014-06-14 MED ORDER — ALBUTEROL SULFATE (2.5 MG/3ML) 0.083% IN NEBU
2.5000 mg | INHALATION_SOLUTION | RESPIRATORY_TRACT | Status: DC | PRN
  Administered 2014-06-15: 2.5 mg via RESPIRATORY_TRACT
  Filled 2014-06-14: qty 3

## 2014-06-14 MED ORDER — INSULIN ASPART 100 UNIT/ML ~~LOC~~ SOLN
0.0000 [IU] | Freq: Every day | SUBCUTANEOUS | Status: DC
  Administered 2014-06-14: 2 [IU] via SUBCUTANEOUS
  Administered 2014-06-15: 3 [IU] via SUBCUTANEOUS

## 2014-06-14 MED ORDER — PREDNISONE 20 MG PO TABS
40.0000 mg | ORAL_TABLET | Freq: Every day | ORAL | Status: DC
Start: 2014-06-15 — End: 2014-06-16
  Administered 2014-06-15 – 2014-06-16 (×2): 40 mg via ORAL
  Filled 2014-06-14 (×3): qty 2

## 2014-06-14 MED ORDER — FLUTICASONE PROPIONATE 50 MCG/ACT NA SUSP
2.0000 | Freq: Every day | NASAL | Status: DC
  Administered 2014-06-14 – 2014-06-16 (×3): 2 via NASAL
  Filled 2014-06-14: qty 16

## 2014-06-14 MED ORDER — POLYETHYLENE GLYCOL 3350 17 G PO PACK
17.0000 g | PACK | Freq: Every day | ORAL | Status: DC
  Administered 2014-06-14 – 2014-06-16 (×2): 17 g via ORAL
  Filled 2014-06-14 (×3): qty 1

## 2014-06-14 MED ORDER — CEFUROXIME AXETIL 500 MG PO TABS
500.0000 mg | ORAL_TABLET | Freq: Two times a day (BID) | ORAL | Status: DC
  Administered 2014-06-14 (×2): 500 mg via ORAL
  Filled 2014-06-14 (×3): qty 1

## 2014-06-14 MED ORDER — IPRATROPIUM-ALBUTEROL 0.5-2.5 (3) MG/3ML IN SOLN
3.0000 mL | Freq: Four times a day (QID) | RESPIRATORY_TRACT | Status: DC
  Administered 2014-06-14 – 2014-06-16 (×10): 3 mL via RESPIRATORY_TRACT
  Filled 2014-06-14 (×10): qty 3

## 2014-06-14 MED ORDER — INSULIN ASPART 100 UNIT/ML ~~LOC~~ SOLN
0.0000 [IU] | Freq: Three times a day (TID) | SUBCUTANEOUS | Status: DC
  Administered 2014-06-14: 15 [IU] via SUBCUTANEOUS
  Administered 2014-06-15: 7 [IU] via SUBCUTANEOUS
  Administered 2014-06-15 (×2): 11 [IU] via SUBCUTANEOUS
  Administered 2014-06-16: 3 [IU] via SUBCUTANEOUS
  Administered 2014-06-16: 4 [IU] via SUBCUTANEOUS

## 2014-06-14 NOTE — Progress Notes (Signed)
Pt refuses to wear CPAP tonight. Encouraged to call RT if pt changes mind.

## 2014-06-14 NOTE — Progress Notes (Signed)
UR completed 

## 2014-06-14 NOTE — Progress Notes (Signed)
Internal Medicine Attending  Date: 06/14/2014  Patient name: Elaine Lee Medical record number: [id number] Date of birth: 1945/01/12 Age: 69 y.o. Gender: female  I saw and evaluated the patient. I reviewed the resident's note by Dr. Glenard Haring and I agree with the resident's findings and plans as documented in his progress note.  Please see my H&P dated 06/14/2014 and attached to Dr. Roxan Hockey H&P dated 06/13/2014 for the specifics of my evaluation, assessment, and plan from earlier today.

## 2014-06-14 NOTE — Progress Notes (Signed)
Patient arrived to 3E16 from ED. Alert and oriented X 4. No complaints of pain. A fib on telemetry. Vital signs stable. Admitted for CHF/Copd. Patient has a non productive cough. [facility] uses [facility].

## 2014-06-14 NOTE — Progress Notes (Signed)
Subjective:    Currently, the patient reports feeling significantly better this morning. She thinks that the prednisone helped with her breathing because it has helped in the past. She reports having a history of asthma when she was a child. In addition, she says that she typically uses C Pap at home and night, but she has not been using it recently due to her cough. She reports that she has had shortness of breath when laying flat for several years, and this has not changed recently. She does not feel quite back to her baseline yet, and she is not quite ready to return home.  Interval Events: -Vital signs stable this morning.  -TTE this morning with EF of 55-60%, pulmonary artery peak pressure 33 mmHg.    Objective:    Vital Signs:   Temp:  [97.4 F (36.3 C)-98.4 F (36.9 C)] 97.5 F (36.4 C) (06/13 1311) Pulse Rate:  [59-116] 59 (06/13 1311) Resp:  [14-22] 18 (06/13 1311) BP: (92-130)/(35-96) 130/70 mmHg (06/13 1311) SpO2:  [86 %-99 %] 94 % (06/13 1311) FiO2 (%):  [32 %] 32 % (06/12 1654) Weight:  [323 lb 6.4 oz (146.693 kg)-328 lb 6.4 oz (148.961 kg)] 325 lb 1.6 oz (147.464 kg) (06/13 0817) Last BM Date: 06/12/14  24-hour weight change: Weight change:   Intake/Output:   Intake/Output Summary (Last 24 hours) at 06/14/14 1441 Last data filed at 06/14/14 1305  Gross per 24 hour  Intake   1302 ml  Output   1100 ml  Net    202 ml      Physical Exam: General: Obese, alert, and talkative.  Lungs:  Slightly increased work of breathing, expiratory wheezing bilaterally.  Heart: RRR. S1 and S2 normal without gallop, murmur, or rubs.  Abdomen:  BS normoactive. Soft, Nondistended, non-tender.  No masses or organomegaly.  Extremities: 1+ pitting edema bilaterally.     Labs:  Basic Metabolic Panel:  Recent Labs Lab 06/13/14 1712 06/13/14 2344 06/14/14 0525  NA 141  --  137  K 3.0*  --  3.6  CL 98*  --  96*  CO2 34*  --  28  GLUCOSE 66  --  408*  BUN 13  --  22*    CREATININE 1.09*  --  1.43*  CALCIUM 9.5  --  9.6  MG  --  1.8  --     Liver Function Tests:  Recent Labs Lab 06/13/14 1712  AST 16  ALT 13*  ALKPHOS 69  BILITOT 0.4  PROT 6.9  ALBUMIN 3.4*   CBC:  Recent Labs Lab 06/13/14 1712  WBC 9.4  NEUTROABS 4.1  HGB 10.3*  HCT 33.0*  MCV 87.3  PLT 253    Cardiac Enzymes:  Recent Labs Lab 06/13/14 2344 06/14/14 0525 06/14/14 1309  TROPONINI <0.03 <0.03 <0.03   CBG:  Recent Labs Lab 06/13/14 2103 06/14/14 0646 06/14/14 1126  GLUCAP 259* 383* 345*   Imaging: Dg Chest 2 View  06/14/2014   CLINICAL DATA:  Cough, shortness of breath.  History of asthma.  EXAM: CHEST  2 VIEW  COMPARISON:  Chest radiograph 06/13/2014.  FINDINGS: Monitoring leads overlie the patient. Stable enlarged cardiac and mediastinal contours. Low lung volumes. Mid and lower lung heterogeneous opacities. No definite pleural effusion or pneumothorax. Mid thoracic spine degenerative changes.  IMPRESSION: Cardiomegaly.  Mid and lower lung heterogeneous opacities may represent mild interstitial pulmonary edema.   Electronically Signed   By: Annia Belt M.D.   On: 06/14/2014  11:12   Dg Chest 2 View  06/13/2014   CLINICAL DATA:  Acute onset of shortness of breath and cough. Initial encounter.  EXAM: CHEST  2 VIEW  COMPARISON:  Chest radiograph performed 03/30/2014  FINDINGS: The lungs are well-aerated. Vascular congestion is noted, with increased interstitial markings, likely reflecting mild interstitial edema. No definite pleural effusion or pneumothorax is seen.  The heart is mildly enlarged. No acute osseous abnormalities are seen. The patient is status post right-sided rotator cuff repair.  IMPRESSION: Vascular congestion and mild cardiomegaly, with mildly increased interstitial markings, likely reflecting mild interstitial edema.   Electronically Signed   By: Roanna Raider M.D.   On: 06/13/2014 17:50       Medications:    Infusions:    Scheduled  Medications: . acetaminophen  975 mg Oral BID  . allopurinol  300 mg Oral Daily  . apixaban  5 mg Oral BID  . atorvastatin  40 mg Oral QHS  . budesonide-formoterol  2 puff Inhalation BID  . cefUROXime  500 mg Oral BID WC  . clonazePAM  2 mg Oral QHS  . diltiazem  240 mg Oral Daily  . FLUoxetine  40 mg Oral Daily  . fluticasone  2 spray Each Nare Daily  . gabapentin  700 mg Oral BID  . insulin aspart  0-9 Units Subcutaneous TID WC  . insulin aspart protamine- aspart  30 Units Subcutaneous BID WC  . ipratropium-albuterol  3 mL Nebulization QID  . loratadine  10 mg Oral q morning - 10a  . magnesium oxide  200 mg Oral Daily  . pantoprazole  80 mg Oral Daily  . polyethylene glycol  17 g Oral Daily  . predniSONE  40 mg Oral Q breakfast  . sodium chloride  3 mL Intravenous Q12H  . theophylline  300 mg Oral Daily  . Trospium Chloride  60 mg Oral Daily    PRN Medications: guaiFENesin-codeine, ipratropium-albuterol   Assessment/ Plan:    Active Problems:   COPD exacerbation   Acute exacerbation of CHF (congestive heart failure)   DM2 (diabetes mellitus, type 2)   CKD (chronic kidney disease)   Neuropathic pain   HTN (hypertension)  #COPD exacerbation The patient is a nonsmoker, but she has a history of asthma. The chronic information likely led to her current COPD. Her symptoms are consistent with an acute COPD exacerbation with increased sputum production. BNP only mildly elevated and echo with normal ejection fraction. Her shortness of breath with lying flat is long-standing, likely related to her obstructive sleep apnea. In addition, her creatinine increased with IV Lasix suggesting she is not volume overloaded. She has improved with steroids and breathing treatments. Initial chest x-ray was poor quality secondary to her large body habitus, same issues on repeat. Troponins have been negative and she denies chest pain. Edema is likely secondary to COPD exacerbation. -Discontinue  telemetry. -Stop trending troponins. -Hold Lasix. -Continue duo nebs every 4 hours. -Albuterol nebulizers every 2 hours when necessary. -Continue prednisone 40 mg daily for 5 days. -Continue home Symbicort 2 puffs twice a day. -Continue Mucinex DM. -Start Flonase 2 sprays daily. -Continue loratadine 10 mg daily. -Continue home theophylline 300 mg daily. -Check theophylline level. -Heart healthy/carb modified diet. -Likely discharge home tomorrow.  #Obstructive sleep apnea The patient has known obstructive sleep apnea, and she's not been using her home CPAP. She likely also has some component of obesity hypoventilation syndrome. Both of these likely were contributing to her shortness of breath  on presentation. -CPAP daily at bedtime.   #Urinary incontinence The patient complained of dysuria on presentation, but urinalysis unremarkable. Her pharmacy, trospium is not available in the hospital. The patient is okay with not taking this medication while hospitalized. -No treatment.  #Hypokalemia Resolved. -Continue to monitor. -Continue home magnesium oxide 200 mg daily.  #Hypertension Currently normotensive off of home losartan. -Continue to hold home losartan 100 mg daily the setting of AKI.  #Constipation The patient reports no bowel movement in the last few days. -MiraLAX daily.  #Atrial fibrillation Rate currently controlled, pacemaker in place. -Continue home Eliquis 5 mg twice a day. -Continue home Cardizem 240 mg daily.  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 -Increase sliding scale insulin to resistant. -Increase NovoLog 70/30 to 40 units twice a day. -Continue gabapentin 700 mg twice a day. -Follow-up hemoglobin A1c.  #Hyperlipidemia -Continue home atorvastatin 40 mg daily.  #Acute on chronic kidney disease Her creatinine was likely at her baseline on presentation. The elevation was likely  prerenal due to the IV Lasix she was given overnight. -Repeat BMP tomorrow.  #Gout -Continue home allopurinol 300 mg daily.  #Depression/anxiety -Continue home Prozac 40 mg daily and Klonopin 2 mg daily at bedtime.  #Insomnia -Continue to hold home Ambien given advanced age.  #GERD -Continue home PPI.   DVT PPX - Eliquis  CODE STATUS - Full  CONSULTS PLACED - None  DISPO - Disposition is deferred at this time, awaiting improvement of COPD exacerbation.   Anticipated discharge in approximately 1-3 day(s).   The patient does not have a current PCP (No primary care provider on file.) and does need an [facility] hospital follow-up appointment after discharge.    Is the [facility] hospital follow-up appointment a one-time only appointment? yes.  Does the patient have transportation limitations that hinder transportation to clinic appointments? yes   SERVICE NEEDED AT DISCHARGE - TO BE DETERMINED DURING HOSPITAL COURSE         Y = Yes, Blank = No PT:   OT:   RN:   Equipment:   Other:      Length of Stay: 1 day(s)   Signed: Donavan Foil, MD  PGY-1, Internal Medicine Resident Pager: [phone] (7AM-5PM) 06/14/2014, 2:41 PM

## 2014-06-14 NOTE — Progress Notes (Signed)
Patient is refusing CPAP for tonight. States when she is coughing she does not wear it.

## 2014-06-14 NOTE — Progress Notes (Signed)
Report given to Adam Phenix, flex RN for 11a-3p shift.  New orders today for CXR and to get pt up to chair.  Both orders completed.  Pt resting quietly in chair

## 2014-06-14 NOTE — Progress Notes (Signed)
  Echocardiogram 2D Echocardiogram has been performed.  Delcie Roch 06/14/2014, 10:33 AM

## 2014-06-15 DIAGNOSIS — J9621 Acute and chronic respiratory failure with hypoxia: Secondary | ICD-10-CM | POA: Insufficient documentation

## 2014-06-15 LAB — HEMOGLOBIN A1C
Hgb A1c MFr Bld: 6.9 % — ABNORMAL HIGH (ref 4.8–5.6)
Mean Plasma Glucose: 151 mg/dL

## 2014-06-15 LAB — GLUCOSE, CAPILLARY
Glucose-Capillary: 292 mg/dL — ABNORMAL HIGH (ref 65–99)
Glucose-Capillary: 282 mg/dL — ABNORMAL HIGH (ref 65–99)
Glucose-Capillary: 226 mg/dL — ABNORMAL HIGH (ref 65–99)
Glucose-Capillary: 263 mg/dL — ABNORMAL HIGH (ref 65–99)

## 2014-06-15 LAB — BASIC METABOLIC PANEL
Anion gap: 9 (ref 5–15)
BUN: 34 mg/dL — ABNORMAL HIGH (ref 6–20)
CHLORIDE: 98 mmol/L — AB (ref 101–111)
CO2: 29 mmol/L (ref 22–32)
Calcium: 9.8 mg/dL (ref 8.9–10.3)
Creatinine, Ser: 1.37 mg/dL — ABNORMAL HIGH (ref 0.44–1.00)
GFR calc Af Amer: 44 mL/min — ABNORMAL LOW (ref >60)
GFR, EST NON AFRICAN AMERICAN: 38 mL/min — AB (ref >60)
GLUCOSE: 286 mg/dL — AB (ref 65–99)
POTASSIUM: 5 mmol/L (ref 3.5–5.1)
Sodium: 136 mmol/L (ref 135–145)

## 2014-06-15 LAB — THEOPHYLLINE LEVEL: THEOPHYLLINE LVL: 6.2 ug/mL — AB (ref 10.0–20.0)

## 2014-06-15 MED ORDER — INSULIN ASPART PROT & ASPART (70-30 MIX) 100 UNIT/ML ~~LOC~~ SUSP
40.0000 [IU] | Freq: Two times a day (BID) | SUBCUTANEOUS | Status: DC
  Administered 2014-06-15: 40 [IU] via SUBCUTANEOUS

## 2014-06-15 MED ORDER — DEXTROMETHORPHAN POLISTIREX ER 30 MG/5ML PO SUER
15.0000 mg | Freq: Two times a day (BID) | ORAL | Status: DC
  Administered 2014-06-15 – 2014-06-16 (×3): 15 mg via ORAL
  Filled 2014-06-15 (×6): qty 5

## 2014-06-15 MED ORDER — INSULIN ASPART PROT & ASPART (70-30 MIX) 100 UNIT/ML ~~LOC~~ SUSP
45.0000 [IU] | Freq: Two times a day (BID) | SUBCUTANEOUS | Status: DC
  Administered 2014-06-15 – 2014-06-16 (×2): 45 [IU] via SUBCUTANEOUS

## 2014-06-15 MED ORDER — GUAIFENESIN-CODEINE 100-10 MG/5ML PO SOLN
5.0000 mL | ORAL | Status: DC | PRN
Start: 2014-06-15 — End: 2014-06-16
  Administered 2014-06-16: 5 mL via ORAL
  Filled 2014-06-15: qty 5

## 2014-06-15 NOTE — Progress Notes (Signed)
Subjective:    The patient reports minimal improvement since yesterday. She continues to have cough, but she has reduced mucus production. She feels like she would be okay at home, but she is not sure if she is ready to be discharged. She has been enjoying her stay at [facility], and she likes it much better than [place].  She reports recently being started on Pulmicort nebulizers in addition to Symbicort, but she does not have a preference for nebulizers vs. Inhalers.  Interval Events: -Vital signs stable this morning.  -Satting in high 90s on 3 L nasal cannula.  -Refused CPAP last night due to cough.    Objective:    Vital Signs:   Temp:  [97.2 F (36.2 C)-97.6 F (36.4 C)] 97.6 F (36.4 C) (06/14 9381) Pulse Rate:  [59-88] 72 (06/14 0613) Resp:  [18-20] 18 (06/14 0613) BP: (108-136)/(53-78) 108/53 mmHg (06/14 0613) SpO2:  [92 %-98 %] 94 % (06/14 0743) Weight:  [321 lb 14 oz (146 kg)] 321 lb 14 oz (146 kg) (06/14 0613) Last BM Date: 06/14/14  24-hour weight change: Weight change: -3 lb 4.8 oz (-1.497 kg)  Intake/Output:   Intake/Output Summary (Last 24 hours) at 06/15/14 0840 Last data filed at 06/15/14 0835  Gross per 24 hour  Intake   1504 ml  Output    101 ml  Net   1403 ml      Physical Exam: General: Obese, alert, and very talkative.  Lungs:  Expiratory wheezing bilaterally.  Heart: RRR. S1 and S2 normal without gallop, murmur, or rubs.  Abdomen:  BS normoactive. Soft, Nondistended, non-tender.  No masses or organomegaly.  Extremities: 1+ pitting edema bilaterally.     Labs:  Basic Metabolic Panel:  Recent Labs Lab 06/13/14 1712 06/13/14 2344 06/14/14 0525 06/15/14 0348  NA 141  --  137 136  K 3.0*  --  3.6 5.0  CL 98*  --  96* 98*  CO2 34*  --  28 29  GLUCOSE 66  --  408* 286*  BUN 13  --  22* 34*  CREATININE 1.09*  --  1.43* 1.37*  CALCIUM 9.5  --  9.6 9.8  MG  --  1.8  --   --     Liver Function Tests:  Recent Labs Lab  06/13/14 1712  AST 16  ALT 13*  ALKPHOS 69  BILITOT 0.4  PROT 6.9  ALBUMIN 3.4*   CBC:  Recent Labs Lab 06/13/14 1712  WBC 9.4  NEUTROABS 4.1  HGB 10.3*  HCT 33.0*  MCV 87.3  PLT 253    Cardiac Enzymes:  Recent Labs Lab 06/13/14 2344 06/14/14 0525 06/14/14 1309  TROPONINI <0.03 <0.03 <0.03   CBG:  Recent Labs Lab 06/14/14 0646 06/14/14 1126 06/14/14 1622 06/14/14 2113 06/15/14 0627  GLUCAP 383* 345* 344* 236* 292*   Imaging: Dg Chest 2 View  06/14/2014   CLINICAL DATA:  Cough, shortness of breath.  History of asthma.  EXAM: CHEST  2 VIEW  COMPARISON:  Chest radiograph 06/13/2014.  FINDINGS: Monitoring leads overlie the patient. Stable enlarged cardiac and mediastinal contours. Low lung volumes. Mid and lower lung heterogeneous opacities. No definite pleural effusion or pneumothorax. Mid thoracic spine degenerative changes.  IMPRESSION: Cardiomegaly.  Mid and lower lung heterogeneous opacities may represent mild interstitial pulmonary edema.   Electronically Signed   By: Annia Belt M.D.   On: 06/14/2014 11:12   Dg Chest 2 View  06/13/2014   CLINICAL  DATA:  Acute onset of shortness of breath and cough. Initial encounter.  EXAM: CHEST  2 VIEW  COMPARISON:  Chest radiograph performed 03/30/2014  FINDINGS: The lungs are well-aerated. Vascular congestion is noted, with increased interstitial markings, likely reflecting mild interstitial edema. No definite pleural effusion or pneumothorax is seen.  The heart is mildly enlarged. No acute osseous abnormalities are seen. The patient is status post right-sided rotator cuff repair.  IMPRESSION: Vascular congestion and mild cardiomegaly, with mildly increased interstitial markings, likely reflecting mild interstitial edema.   Electronically Signed   By: Roanna Raider M.D.   On: 06/13/2014 17:50       Medications:    Infusions:    Scheduled Medications: . acetaminophen  975 mg Oral BID  . allopurinol  300 mg Oral Daily    . apixaban  5 mg Oral BID  . atorvastatin  40 mg Oral QHS  . budesonide (PULMICORT) nebulizer solution  0.25 mg Nebulization BID  . cefUROXime  500 mg Oral BID WC  . clonazePAM  2 mg Oral QHS  . diltiazem  240 mg Oral Daily  . FLUoxetine  40 mg Oral Daily  . fluticasone  2 spray Each Nare Daily  . gabapentin  700 mg Oral BID  . insulin aspart  0-20 Units Subcutaneous TID WC  . insulin aspart  0-5 Units Subcutaneous QHS  . insulin aspart protamine- aspart  40 Units Subcutaneous BID WC  . ipratropium-albuterol  3 mL Nebulization QID  . loratadine  10 mg Oral q morning - 10a  . magnesium oxide  200 mg Oral Daily  . pantoprazole  80 mg Oral Daily  . polyethylene glycol  17 g Oral Daily  . predniSONE  40 mg Oral Q breakfast  . sodium chloride  3 mL Intravenous Q12H  . theophylline  300 mg Oral Daily  . 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 Some of her shortness of breath is likely secondary to her obstructive sleep, and she has not been using her CPAP. Her theophylline level is appropriately low, will continue current dose.  Does not need to be on both Symbicort and Pulmicort.  Our preference is for Symbicort given combined LABA and steroid effects. -Hold Lasix. -Continue Duonebs every 4 hours. -Albuterol nebulizers every 2 hours when necessary. -Continue prednisone 40 mg daily for 5 days, day 2. -Continue home Symbicort 2 puffs twice a day. -Stop Pulmicort at discharge. -Continue guaifenesin-codeine syrup every 4 hours when necessary. -Start dextromethorphan BID to help with  cough. -Continue Flonase 2 sprays daily. -Continue loratadine 10 mg daily. -Continue home theophylline 300 mg daily. -Heart healthy/carb modified diet. -Patient will need referral to Pulmonology by [facility] at follow up.  #Obstructive sleep apnea Noncompliant with CPAP, slept poorly last night and feels tired today. -Encouraged patient to use CPAP daily at bedtime.   #Acute on chronic kidney disease Creatinine slightly improved since yesterday, likely from IV Lasix yesterday. -Continue to monitor creatinine.  #Hypertension -Continue to hold home losartan 100 mg daily the setting of AKI.  #Type 2 diabetes with peripheral neuropathy  and CKD Blood sugar remains elevated secondary to steroids, but hemoglobin A1c 6.9 suggesting good control on her home regimen normally. -Continue sliding scale insulin resistant. -Increase NovoLog 70/30 to 45 units twice a day. -Continue gabapentin 700 mg twice a day.  #Atrial fibrillation -Continue home Eliquis 5 mg twice a day. -Continue home Cardizem 240 mg daily.  #Hyperlipidemia -Continue home atorvastatin 40 mg daily.  #Constipation -MiraLAX daily.  #Urinary incontinence -Unable to receive home trospium.  #Hypomagnesemia -Continue home magnesium oxide 200 mg daily.  #Gout -Continue home allopurinol 300 mg daily.  #Depression/anxiety -Continue home Prozac 40 mg daily and Klonopin 2 mg daily at bedtime.  #Insomnia -Continue to hold home Ambien given advanced age.  #GERD -Continue home PPI.   DVT PPX - Eliquis  CODE STATUS - Full  CONSULTS PLACED - None  DISPO - Disposition is deferred at this time, awaiting improvement of COPD exacerbation.   Likely discharge tomorrow.  The patient does not have a current PCP (No primary care provider on file.) and does need an [facility] hospital follow-up appointment after discharge.    Is the [facility] hospital follow-up appointment a one-time only appointment? yes.  Does the patient have  transportation limitations that hinder transportation to clinic appointments? yes   SERVICE NEEDED AT DISCHARGE - TO BE DETERMINED DURING HOSPITAL COURSE         Y = Yes, Blank = No PT:   OT:   RN:   Equipment:   Other:      Length of Stay: 2 day(s)   Signed: Donavan Foil, MD  PGY-1, Internal Medicine Resident Pager: [phone] (7AM-5PM) 06/15/2014, 8:40 AM

## 2014-06-15 NOTE — Progress Notes (Signed)
Internal Medicine Attending  Date: 06/15/2014  Patient name: Elaine Lee Medical record number: [id number] Date of birth: 06-Jan-1945 Age: 69 y.o. Gender: female  I saw and evaluated the patient. I reviewed the resident's note by Dr. Glenard Haring and I agree with the resident's findings and plans as documented in his progress note.  Not much subjective improvement in her dyspnea and cough over the last 24 hours.  She also feels tired this morning, although she was reminded that she refused to wear the nocturnal CPAP and that could be a contributor.  We have streamlined her outpatient pulmonary regimen to inhalers as she has no preference to inhalers Vs nebulizers.  We will continue with the therapy for her acute on chronic respiratory failure, specifically the oral steroids and antibiotics, in addition to aggressive bronchodilators.  We will reassess subjective dyspnea and cough again in the AM although I anticipate she will be ready for discharge at that time.

## 2014-06-15 NOTE — Clinical Documentation Improvement (Signed)
Pt. Presents with SOB x 2 days without improvement with home inhalers. She is on 3L home O2. SpO2  On 06/14/2014-92% Please document if pt. Meets any dx listed below.   Possible Conditions _______________Acute respiratory failure _______________Chronic respiratory failure  _______________Acute on Chronic respiratory failur  Other Not able to determine  Risk Factors: OSA requiring nocturnal CPAP , h/o Asthma, COPD,  Sign & Symptoms:Lungs: Diffuse expiratory wheezing with prolonged expiratory phase bilaterally: slightly dyspneic with long-winded answers.  Diagnostics:CHEST 2 VIEW  IMPRESSION: Cardiomegaly. Mid and lower lung heterogeneous opacities may represent mild interstitial pulmonary edema.  Treatment: O2 @ 4L/[place] with O2 sats on 06/14/2014 94-95% Bronchodilators,  Thank you,  Andy Gauss ,RN Clinical Documentation Specialist:  [phone]  [facility]- Health Information Management

## 2014-06-16 LAB — BASIC METABOLIC PANEL
Anion gap: 8 (ref 5–15)
BUN: 29 mg/dL — ABNORMAL HIGH (ref 6–20)
CO2: 31 mmol/L (ref 22–32)
Calcium: 9.6 mg/dL (ref 8.9–10.3)
Chloride: 99 mmol/L — ABNORMAL LOW (ref 101–111)
Creatinine, Ser: 1.28 mg/dL — ABNORMAL HIGH (ref 0.44–1.00)
GFR calc non Af Amer: 41 mL/min — ABNORMAL LOW (ref >60)
GFR, EST AFRICAN AMERICAN: 48 mL/min — AB (ref >60)
Glucose, Bld: 203 mg/dL — ABNORMAL HIGH (ref 65–99)
POTASSIUM: 4.5 mmol/L (ref 3.5–5.1)
Sodium: 138 mmol/L (ref 135–145)

## 2014-06-16 LAB — GLUCOSE, CAPILLARY
GLUCOSE-CAPILLARY: 125 mg/dL — AB (ref 65–99)
Glucose-Capillary: 179 mg/dL — ABNORMAL HIGH (ref 65–99)

## 2014-06-16 MED ORDER — CEFUROXIME AXETIL 500 MG PO TABS: 500.0000 mg | ORAL_TABLET | Freq: Two times a day (BID) | ORAL | Status: AC

## 2014-06-16 MED ORDER — GABAPENTIN 600 MG PO TABS: 600.0000 mg | ORAL_TABLET | Freq: Two times a day (BID) | ORAL | Status: DC

## 2014-06-16 MED ORDER — POLYETHYLENE GLYCOL 3350 17 G PO PACK: 17.0000 g | PACK | Freq: Every day | ORAL | Status: AC

## 2014-06-16 MED ORDER — DEXTROMETHORPHAN POLISTIREX ER 30 MG/5ML PO SUER: 15.0000 mg | Freq: Two times a day (BID) | ORAL | Status: DC

## 2014-06-16 MED ORDER — PREDNISONE 20 MG PO TABS: 40.0000 mg | ORAL_TABLET | Freq: Every day | ORAL | Status: AC

## 2014-06-16 NOTE — Progress Notes (Signed)
Subjective:    The patient reports that her breathing is similar to yesterday, but she has not received a breathing treatment this morning or her theophylline yet. She thinks these treatments will improve her breathing, and she is comfortable going home today. She thinks that her cough is better after receiving dextromethorphan yesterday. She reports using her CPAP last night, which helped with her breathing.  Interval Events: -Vital signs stable this morning.    Objective:    Vital Signs:   Temp:  [97.6 F (36.4 C)-98.1 F (36.7 C)] 97.9 F (36.6 C) (06/15 0605) Pulse Rate:  [79-82] 82 (06/15 0605) Resp:  [18-21] 21 (06/15 0605) BP: (105-136)/(51-60) 136/60 mmHg (06/15 0605) SpO2:  [95 %-100 %] 96 % (06/15 0605) Weight:  [325 lb 2.9 oz (147.5 kg)] 325 lb 2.9 oz (147.5 kg) (06/15 0605) Last BM Date: 06/15/14  24-hour weight change: Weight change: 1.3 oz (0.036 kg)  Intake/Output:   Intake/Output Summary (Last 24 hours) at 06/16/14 0844 Last data filed at 06/16/14 0200  Gross per 24 hour  Intake    720 ml  Output    550 ml  Net    170 ml      Physical Exam: General: Obese, alert, no acute distress.   Lungs:  Expiratory wheezing bilaterally.  Heart: RRR. S1 and S2 normal without gallop, murmur, or rubs.  Abdomen:  BS normoactive. Soft, Nondistended, non-tender.  No masses or organomegaly.  Extremities: 1+ pitting edema bilaterally.     Labs:  Basic Metabolic Panel:  Recent Labs Lab 06/13/14 1712 06/13/14 2344 06/14/14 0525 06/15/14 0348 06/16/14 0300  NA 141  --  137 136 138  K 3.0*  --  3.6 5.0 4.5  CL 98*  --  96* 98* 99*  CO2 34*  --  28 29 31   GLUCOSE 66  --  408* 286* 203*  BUN 13  --  22* 34* 29*  CREATININE 1.09*  --  1.43* 1.37* 1.28*  CALCIUM 9.5  --  9.6 9.8 9.6  MG  --  1.8  --   --   --     Liver Function Tests:  Recent Labs Lab 06/13/14 1712  AST 16  ALT 13*  ALKPHOS 69  BILITOT 0.4  PROT 6.9  ALBUMIN 3.4*   CBC:  Recent  Labs Lab 06/13/14 1712  WBC 9.4  NEUTROABS 4.1  HGB 10.3*  HCT 33.0*  MCV 87.3  PLT 253    Cardiac Enzymes:  Recent Labs Lab 06/13/14 2344 06/14/14 0525 06/14/14 1309  TROPONINI <0.03 <0.03 <0.03   CBG:  Recent Labs Lab 06/15/14 0627 06/15/14 1110 06/15/14 1633 06/15/14 2145 06/16/14 0628  GLUCAP 292* 282* 226* 263* 179*   Imaging: Dg Chest 2 View  06/14/2014   CLINICAL DATA:  Cough, shortness of breath.  History of asthma.  EXAM: CHEST  2 VIEW  COMPARISON:  Chest radiograph 06/13/2014.  FINDINGS: Monitoring leads overlie the patient. Stable enlarged cardiac and mediastinal contours. Low lung volumes. Mid and lower lung heterogeneous opacities. No definite pleural effusion or pneumothorax. Mid thoracic spine degenerative changes.  IMPRESSION: Cardiomegaly.  Mid and lower lung heterogeneous opacities may represent mild interstitial pulmonary edema.   Electronically Signed   By: Annia Belt M.D.   On: 06/14/2014 11:12       Medications:    Infusions:    Scheduled Medications: . acetaminophen  975 mg Oral BID  . allopurinol  300 mg Oral Daily  . apixaban  5 mg Oral BID  . atorvastatin  40 mg Oral QHS  . budesonide (PULMICORT) nebulizer solution  0.25 mg Nebulization BID  . cefUROXime  500 mg Oral BID WC  . clonazePAM  2 mg Oral QHS  . dextromethorphan  15 mg Oral BID  . diltiazem  240 mg Oral Daily  . FLUoxetine  40 mg Oral Daily  . fluticasone  2 spray Each Nare Daily  . gabapentin  700 mg Oral BID  . insulin aspart  0-20 Units Subcutaneous TID WC  . insulin aspart  0-5 Units Subcutaneous QHS  . insulin aspart protamine- aspart  45 Units Subcutaneous BID WC  . ipratropium-albuterol  3 mL Nebulization QID  . loratadine  10 mg Oral q morning - 10a  . magnesium oxide  200 mg Oral Daily  . pantoprazole  80 mg Oral Daily  . polyethylene glycol  17 g Oral Daily  . predniSONE  40 mg Oral Q breakfast  . sodium chloride  3 mL Intravenous Q12H  . theophylline   300 mg Oral Daily  . Trospium Chloride  60 mg Oral Daily    PRN Medications: albuterol, guaiFENesin-codeine   Assessment/ Plan:    Principal Problem:   COPD exacerbation Active Problems:   DM2 (diabetes mellitus, type 2)   CKD (chronic kidney disease)   Neuropathic pain   HTN (hypertension)   Type 2 diabetes with stage 3 chronic kidney disease GFR 30-59   Acute on chronic respiratory failure with hypoxia  #COPD exacerbation Her breathing is stable this morning, and she is ready to return home. -Continue Duonebs every 4 hours. -Albuterol nebulizers every 2 hours when necessary. -Continue prednisone 40 mg daily for 5 days, day 3. -Continue home Symbicort 2 puffs twice a day. -Stop Pulmicort at discharge. -Continue guaifenesin-codeine syrup every 4 hours when necessary. -Continue dextromethorphan BID. -Continue Flonase 2 sprays daily. -Continue loratadine 10 mg daily. -Continue home theophylline 300 mg daily. -Heart healthy/carb modified diet. -Patient will need referral to Pulmonology by [facility] at follow up.  #Obstructive sleep apnea Improved sleeping energy this morning after using CPAP last night. -Continue CPAP daily at bedtime.  #Acute on chronic kidney disease Creatinine continues to improve. Not quite to baseline. -Continue to monitor creatinine. -Hold Lasix and losartan at discharge, restart per PCP.  #Hypertension Normotensive. -Continue to hold home losartan 100 mg daily the setting of AKI.  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 She only needs 3 more days of steroids. -Continue sliding scale insulin resistant. -Increase NovoLog 70/30 to 45 units twice a day. -Continue gabapentin 700 mg twice a day. -Resume home insulin regimen at discharge.  #Atrial fibrillation -Continue home Eliquis 5 mg twice a day. -Continue home Cardizem 240 mg daily.  #Hyperlipidemia -Continue home atorvastatin 40 mg  daily.  #Constipation -Continue MiraLAX daily.  #Urinary incontinence -Unable to receive home trospium, resume at discharge.  #Hypomagnesemia -Continue home magnesium oxide 200 mg daily.  #Gout -Continue home allopurinol 300 mg daily.  #Depression/anxiety -Continue home Prozac 40 mg daily and Klonopin 2 mg daily at bedtime.  #Insomnia -Continue to hold home Ambien given advanced age.  #GERD -Continue home PPI.   DVT PPX - Eliquis  CODE STATUS - Full  CONSULTS PLACED - None  DISPO - Discharge home today.  The patient does not have a current PCP (No primary care provider on file.) and does need an [facility] hospital follow-up appointment after discharge.  Is the [facility] hospital follow-up appointment a one-time only appointment? yes.  Does the patient have transportation limitations that hinder transportation to clinic appointments? yes   SERVICE NEEDED AT DISCHARGE - TO BE DETERMINED DURING HOSPITAL COURSE         Y = Yes, Blank = No PT:   OT:   RN:   Equipment:   Other:      Length of Stay: 3 day(s)   Signed: Donavan Foil, MD  PGY-1, Internal Medicine Resident Pager: [phone] (7AM-5PM) 06/16/2014, 8:44 AM

## 2014-06-16 NOTE — Discharge Instructions (Signed)
·   Thank you for allowing [place] to be involved in your healthcare while you were hospitalized at [facility].   Please note that there have been changes to your home medications.  --> PLEASE LOOK AT YOUR DISCHARGE MEDICATION LIST FOR DETAILS.   Please call your PCP if you have any questions or concerns, or any difficulty getting any of your medications.  Please return to the ER if you have worsening of your symptoms or new severe symptoms arise.  Make sure to complete your cefuroxime and prednisone.  It is very important that you use your CPAP at night. This will help with your breathing and energy.  Do not take your Lasix, losartan, or potassium at discharge. Your PCP will need to recheck your kidney function prior to restarting these medications.  We recommend that you only take Symbicort and stop taking Pulmicort. Your PCP can refer you to a pulmonologist for further recommendations.

## 2014-06-16 NOTE — Discharge Summary (Signed)
Name: Elaine Lee MRN: [id number] DOB: [birth year] 69 y.o. 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     albuterol 108 (90 BASE) MCG/ACT inhaler  Commonly known as:  PROVENTIL HFA;VENTOLIN HFA  Inhale 2 puffs into the lungs every 6 (six) hours as needed for wheezing or shortness of breath.     allopurinol 300 MG tablet  Commonly known as:  ZYLOPRIM  Take 300 mg by mouth daily.     atorvastatin 40 MG tablet  Commonly known as:  LIPITOR  Take 40 mg by mouth at bedtime.     BIOFREEZE ROLL-ON 4 % Gel  Generic drug:  Menthol (Topical Analgesic)  Apply 1 application topically 2 (two) times daily as needed (for joint pain).     budesonide-formoterol 160-4.5 MCG/ACT inhaler  Commonly known as:  SYMBICORT  Inhale 2 puffs into the lungs 2 (two) times daily.     cefUROXime 500 MG tablet  Commonly known as:  CEFTIN  Take 1 tablet (500 mg total) by mouth 2 (two) times daily with a meal.     Cholecalciferol 1000 UNITS capsule  Take 1,000 Units by mouth daily.     clonazePAM 1 MG tablet  Commonly known  as:  KLONOPIN  Take 2 mg by mouth at bedtime.     dextromethorphan 30 MG/5ML liquid  Commonly known as:  DELSYM  Take 2.5 mLs (15 mg total) by mouth 2 (two) times daily.     diclofenac sodium 1 % Gel  Commonly known as:  VOLTAREN  Apply 2 g topically 4 (four) times daily as needed (for pain).     diltiazem 240 MG 24 hr capsule  Commonly known as:  CARDIZEM CD  Take 240 mg by mouth daily.     ELIQUIS 5 MG Tabs tablet  Generic drug:  apixaban  Take 5 mg by mouth 2 (two) times daily.     FLUoxetine 40 MG capsule  Commonly known as:  PROZAC  Take 40 mg by mouth daily.     fluticasone 50 MCG/ACT nasal spray  Commonly known as:  FLONASE  Place 2 sprays into both nostrils daily.     gabapentin 600 MG tablet  Commonly known as:  NEURONTIN  Take 1 tablet (600 mg total) by mouth 2 (two) times daily.     guaiFENesin-codeine 100-10 MG/5ML syrup  Commonly known as:  ROBITUSSIN AC  Take 5 mLs by mouth 4 (four) times daily  as needed for cough.     insulin aspart protamine- aspart (70-30) 100 UNIT/ML injection  Commonly known as:  NOVOLOG MIX 70/30  Inject 45-48 Units into the skin 2 (two) times daily with a meal. Takes 45 units in am and 48 units in pm     ipratropium-albuterol 0.5-2.5 (3) MG/3ML Soln  Commonly known as:  DUONEB  Take 3 mLs by nebulization every 4 (four) hours as needed (for wheezing or shortness of breath).     loratadine 10 MG tablet  Commonly known as:  CLARITIN  Take 10 mg by mouth every morning.     Magnesium Oxide 250 MG Tabs  Take 250 mg by mouth daily.     omeprazole 40 MG capsule  Commonly known as:  PRILOSEC  Take 40 mg by mouth at bedtime.     OXYGEN  Inhale 2-3 L into the lungs continuous.     polyethylene glycol packet  Commonly known as:  MIRALAX / GLYCOLAX  Take 17 g by mouth daily.     predniSONE 20 MG tablet  Commonly known as:  DELTASONE  Take 2 tablets (40 mg total) by mouth daily with breakfast.     SPIRIVA RESPIMAT 2.5 MCG/ACT  Aers  Generic drug:  Tiotropium Bromide Monohydrate  Inhale 1 puff into the lungs daily.     theophylline 300 MG 12 hr tablet  Commonly known as:  THEODUR  Take 300 mg by mouth daily.     Trospium Chloride 60 MG Cp24  Take 60 mg by mouth daily.     vitamin B-12 100 MCG tablet  Commonly known as:  CYANOCOBALAMIN  Take 100 mcg by mouth daily.     zolpidem 5 MG tablet  Commonly known as:  AMBIEN  Take 5 mg by mouth at bedtime.        Disposition and follow-up:   Elaine Lee was discharged from [facility] in Stable condition.  At the hospital follow up visit please address:  1.  Resolution of dyspnea and AKI, referral to pulmonology, compliance with CPAP, determine if Lasix, potassium, and losartan should be restarted.  2.  Labs / imaging needed at time of follow-up: BMP.  3.  Pending labs/ test needing follow-up: None.  Follow-up Appointments: Follow-up Information    Follow up with Thane Edu, MD.   Specialty:  Family Medicine   Why:  Will see you at [facility].   Contact information:   [address] [place] [address] [phone]       Discharge Instructions:  Thank you for allowing [place] to be involved in your healthcare while you were hospitalized at [facility].   Please note that there have been changes to your home medications.  --> PLEASE LOOK AT YOUR DISCHARGE MEDICATION LIST FOR DETAILS.   Please call your PCP if you have any questions or concerns, or any difficulty getting any of your medications.  Please return to the ER if you have worsening of your symptoms or new severe symptoms arise.  Make sure to complete your cefuroxime and prednisone.  It is very important that you use your CPAP at night. This will help with your breathing and energy.  Do not take your Lasix, losartan, or potassium at discharge. Your PCP will need to recheck your kidney function prior to restarting these  medications.  We recommend that you only take Symbicort and stop taking Pulmicort. Your PCP can refer you to a pulmonologist for further  recommendations.  Consultations: None.  Procedures Performed:  Dg Chest 2 View  06/14/2014   CLINICAL DATA:  Cough, shortness of breath.  History of asthma.  EXAM: CHEST  2 VIEW  COMPARISON:  Chest radiograph 06/13/2014.  FINDINGS: Monitoring leads overlie the patient. Stable enlarged cardiac and mediastinal contours. Low lung volumes. Mid and lower lung heterogeneous opacities. No definite pleural effusion or pneumothorax. Mid thoracic spine degenerative changes.  IMPRESSION: Cardiomegaly.  Mid and lower lung heterogeneous opacities may represent mild interstitial pulmonary edema.   Electronically Signed   By: Annia Belt M.D.   On: 06/14/2014 11:12   Dg Chest 2 View  06/13/2014   CLINICAL DATA:  Acute onset of shortness of breath and cough. Initial encounter.  EXAM: CHEST  2 VIEW  COMPARISON:  Chest radiograph performed 03/30/2014  FINDINGS: The lungs are well-aerated. Vascular congestion is noted, with increased interstitial markings, likely reflecting mild interstitial edema. No definite pleural effusion or pneumothorax is seen.  The heart is mildly enlarged. No acute osseous abnormalities are seen. The patient is status post right-sided rotator cuff repair.  IMPRESSION: Vascular congestion and mild cardiomegaly, with mildly increased interstitial markings, likely reflecting mild interstitial edema.   Electronically Signed   By: Roanna Raider M.D.   On: 06/13/2014 17:50    2D Echo:  Study Conclusions  - Left ventricle: The cavity size was normal. Systolic function was normal. The estimated ejection fraction was in the range of 55% to 60%. Wall motion was normal; there were no regional wall motion abnormalities. - Pulmonary arteries: PA peak pressure: 33 mm Hg (S).  Admission HPI:  Pt is a 69 y/o F w/ PMHx of COPD, CHF, DM, OSA, atrial fibrillation  on eliquis, HTN, and CKD who presents with shortness of breath. Pt states for the past 2 days she has had increased cough with yellow sputum production and SOB. Due to cough pt has developed chest pain that she experiences only when she is coughing. SOB did not improve with her home inhalers. She has SOB all of the time but has increased worsening over the past 2 days. She has had issues with leg swelling and states swelling is now up to her thighs. Leg swelling has worsened over the past week. She has orthopnea and sleeps on a hospital bed w/ the head elevated for the past few years. She is on 3L home O2. She has hx of frequent admission for CHF exacerbations and has been seen at [facility] for this, last admission in March. She follows with Dr. Gerome Apley w/ pulmonology but has stopped seeing him since she joined [facility] in April. Pt is also complaining of dysuria, increased urinary incontinence, and foul odor to urine x 2-3 days. CXR in the ED reveals vascular congestion with mildly increased interstitial markings. She was given IV solumedrol and a breathing tx in the ED.   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 Chest x-rays were poor quality, but did not demonstrate any evidence of pneumonia. There was initially concern for a CHF exacerbation given her orthopnea (this is chronic for her) and lower  extremity swelling, so she was given IV Lasix and an echocardiogram was ordered that demonstrated a normal ejection fraction. Given her history of asthma, which has likely progressed to COPD, her shortness of breath and leg  swelling were likely secondary to a COPD exacerbation. She was treated with oxygen, nebulizers, prednisone, and cefuroxime with improvement of her cough and shortness of breath. She was satting in the low 90s on her home 3 L of oxygen at discharge. She reported taking both Symbicort and Pulmicort at home. Given the overlap of these medications and her lack of preference for nebulizers over inhalers, she was told to stop taking Pulmicort and resume Symbicort at discharge. A theophylline level was checked and found to be appropriate, so her home theophylline was continued during the hospitalization and at discharge. In addition, she was told to resume her Spiriva at discharge and take albuterol as needed. At follow-up, please check for resolution of her shortness of breath and cough. She would benefit from being referred to a new pulmonologist now that she is with [facility].  #Obstructive sleep apnea The patient reported not using her home CPAP for the past couple of weeks secondary to her cough. It is likely that her obstructive sleep apnea contributed somewhat to her shortness of breath on presentation. She was encouraged to continue to use her home CPAP to treat her obstructive sleep apnea.  #Acute kidney injury The patient's creatinine was slightly elevated at 1.09 on presentation. This is likely her baseline, but no previous creatinine levels were available. Her creatinine increased to 1.43 after administration of IV Lasix. Her home Lasix, losartan, and potassium supplement were held during the hospitalization and at discharge as a result with improvement of her creatinine to 1.28. Her blood pressure was stable off of these medications.  Given the patient's kidney function her gabapentin dose was reduced to 600 mg twice a day. At follow-up, please repeat a BMP and restart her Lasix, potassium, and losartan as indicated.  #Type 2 diabetes with peripheral neuropathy The patient's home insulin dose was initially  reduced, but her blood sugars were elevated secondary to prednisone. Her blood sugars were ultimately controlled on her home regimen along with an insulin sliding scale, and she was discharged home on her home insulin regimen.  #Atrial fibrillation The patient's rate was controlled on her home Cardizem 240 mg daily during the hospitalization. Her home Eliquis 5 mg twice a day was continued during the hospitalization and at discharge.  Discharge Vitals:   BP 136/60 mmHg  Pulse 82  Temp(Src) 97.9 F (36.6 C) (Oral)  Resp 21  Ht 5\' 5"  (1.651 m)  Wt 325 lb 2.9 oz (147.5 kg)  BMI 54.11 kg/m2  SpO2 96%  Discharge Labs:  Results for orders placed or performed during the hospital encounter of 06/13/14 (from the past 24 hour(s))  Glucose, capillary     Status: Abnormal   Collection Time: 06/15/14  4:33 PM  Result Value Ref Range   Glucose-Capillary 226 (H) 65 - 99 mg/dL  Glucose, capillary     Status: Abnormal   Collection Time: 06/15/14  9:45 PM  Result Value Ref Range   Glucose-Capillary 263 (H) 65 - 99 mg/dL   Comment 1 Notify RN    Comment 2 Document in Chart   Basic metabolic panel     Status: Abnormal   Collection Time: 06/16/14  3:00 AM  Result Value Ref Range   Sodium 138 135 - 145 mmol/L   Potassium 4.5 3.5 -  5.1 mmol/L   Chloride 99 (L) 101 - 111 mmol/L   CO2 31 22 - 32 mmol/L   Glucose, Bld 203 (H) 65 - 99 mg/dL   BUN 29 (H) 6 - 20 mg/dL   Creatinine, Ser 4.09 (H) 0.44 - 1.00 mg/dL   Calcium 9.6 8.9 - 81.1 mg/dL   GFR calc non Af Amer 41 (L) >60 mL/min   GFR calc Af Amer 48 (L) >60 mL/min   Anion gap 8 5 - 15  Glucose, capillary     Status: Abnormal   Collection Time: 06/16/14  6:28 AM  Result Value Ref Range   Glucose-Capillary 179 (H) 65 - 99 mg/dL  Glucose, capillary     Status: Abnormal   Collection Time: 06/16/14 11:27 AM  Result Value Ref Range   Glucose-Capillary 125 (H) 65 - 99 mg/dL   Comment 1 Notify RN     Signed: Donavan Foil, MD 06/16/2014,  11:58 AM    Services Ordered on Discharge: None. Equipment Ordered on Discharge: None.

## 2014-06-16 NOTE — Progress Notes (Signed)
Patient alert oriented, patient is at baseline, mild shortness of breath with activities. V/s stable. D/C instruction explain and given to patient. Patient verbalized understanding. D/C patient home with oxygen per order.

## 2014-06-16 NOTE — Progress Notes (Signed)
RT placed patient on CPAP per home settings of 11 cmH20. Pt tolerating well at this time.

## 2014-06-16 NOTE — Progress Notes (Signed)
Internal Medicine Attending  Date: 06/16/2014  Patient name: Elaine Lee Medical record number: [id number] Date of birth: 27-Jun-1945 Age: 69 y.o. Gender: female  I saw and evaluated the patient. I reviewed the resident's note by Dr. Glenard Haring and I agree with the resident's findings and plans as documented in his progress note.  Mr. Ratterman was seen on rounds this morning and her breathing was near baseline per her report. We are continuing her bronchodilators, antibiotics, and steroids for her COPD exacerbation. She felt safe to go home. She was therefore discharged home with follow-up in the [place] clinic.

## 2014-07-19 ENCOUNTER — Encounter: Payer: Self-pay | Admitting: Pulmonary Disease

## 2014-07-29 ENCOUNTER — Institutional Professional Consult (permissible substitution): Payer: Medicare (Managed Care) | Admitting: Pulmonary Disease

## 2014-08-31 ENCOUNTER — Other Ambulatory Visit: Payer: Self-pay | Admitting: Family Medicine

## 2014-08-31 DIAGNOSIS — R053 Chronic cough: Secondary | ICD-10-CM

## 2014-08-31 DIAGNOSIS — R05 Cough: Secondary | ICD-10-CM

## 2014-09-03 ENCOUNTER — Ambulatory Visit
Admission: RE | Admit: 2014-09-03 | Discharge: 2014-09-03 | Disposition: A | Discharge status: Home / Self Care | Payer: Medicare (Managed Care) | Source: Ambulatory Visit | Attending: Family Medicine | Admitting: Family Medicine

## 2014-09-03 DIAGNOSIS — R05 Cough: Secondary | ICD-10-CM

## 2014-09-03 DIAGNOSIS — R053 Chronic cough: Secondary | ICD-10-CM

## 2014-09-27 ENCOUNTER — Ambulatory Visit (INDEPENDENT_AMBULATORY_CARE_PROVIDER_SITE_OTHER): Payer: Medicare (Managed Care) | Admitting: Pulmonary Disease

## 2014-09-27 ENCOUNTER — Encounter: Payer: Self-pay | Admitting: Pulmonary Disease

## 2014-09-27 VITALS — BP 124/62 | HR 61 | Temp 97.9°F | Ht 65.0 in | Wt 313.0 lb

## 2014-09-27 DIAGNOSIS — Z6841 Body Mass Index (BMI) 40.0 and over, adult: Secondary | ICD-10-CM

## 2014-09-27 DIAGNOSIS — IMO0002 Reserved for concepts with insufficient information to code with codable children: Secondary | ICD-10-CM

## 2014-09-27 DIAGNOSIS — R0600 Dyspnea, unspecified: Secondary | ICD-10-CM

## 2014-09-27 DIAGNOSIS — J45998 Other asthma: Secondary | ICD-10-CM | POA: Diagnosis not present

## 2014-09-27 DIAGNOSIS — E662 Morbid (severe) obesity with alveolar hypoventilation: Secondary | ICD-10-CM

## 2014-09-27 DIAGNOSIS — G4733 Obstructive sleep apnea (adult) (pediatric): Secondary | ICD-10-CM

## 2014-09-27 MED ORDER — MONTELUKAST SODIUM 10 MG PO TABS: 10.0000 mg | ORAL_TABLET | Freq: Every day | ORAL | Status: DC

## 2014-09-27 NOTE — Progress Notes (Signed)
Chief Complaint  Patient presents with  . PULMONARY CONSULT    pt referred by Dr. Dorothe Pea. pt states she is having difficulties with her asthma. pt states she has been having a hard time breathing. pt currently using oxygen as needed set at 2.5LPM. pt using CPAP at night  for about 8 hours with O2 set at 3 LPM.pt states she has some wheezing  and uses inhailer and nebulizer with relief. DME: [facility]    History of Present Illness: Elaine Lee is a 69 y.o. female for evaluation of dyspnea and sleep apnea.  She has sleep study at [facility] in [place].  She was being seen by Dr. Tomasita Morrow.  She has been using CPAP.  She is also using 3 liters oxygen 24/7.  She has cough with clear to yellow sputum.  She denies hemoptysis.  She reports hx of asthma, and has been on steroids frequently.  Her breathing is better when she takes steroids.  She gets sinus drainage, and wheeze from chest and throat.  She has been using flonase.  She had allergy testing done years ago >> told she was allergic to dog, cockroaches, and ragweed.  She denies food allergies or allergic reactions to aspirin.  She did have pneumonia before, but denies history of tuberculosis.  She worked as a Lawyer for 25 years.  She never smoked cigarettes.  She is from [place], but has lived in [place] for almost 50 years.  She was started on antibiotics recently for UTI, and this seemed to help her cough and breathing.  She is not very active.  She gets winded when she does activity >> cleaning around the house.  She was in hospital in June for "COPD exacerbation", and acute on chronic respiratory failure.  Tests: Echo 06/14/14 >> EF 55%, PAS 33 mmHg CT chest 09/03/14 >> ATX, bronchial thickening, atherosclerosis  Elaine Lee  has a past medical history of Diabetes mellitus without complication; CHF (congestive heart failure); Renal disorder; COPD (chronic obstructive pulmonary disease); Hypertension; Asthma;  Obstructive sleep apnea on CPAP; A-fib; Hypercholesteremia; Chronic edema; CKD (chronic kidney disease), stage III; Diabetic neuropathy; Degenerative lumbar disc; Osteoarthritis; Gout; GERD (gastroesophageal reflux disease); Depression; Hearing loss; Urinary frequency; and Insomnia.  Elaine Lee  has past surgical history that includes Abdominal hysterectomy.  Prior to Admission medications   Medication Sig Start Date End Date Taking? Authorizing Provider  acetaminophen (TYLENOL) 325 MG tablet Take 975 mg by mouth 2 (two) times daily.    Yes Historical Provider, MD  albuterol (PROVENTIL HFA;VENTOLIN HFA) 108 (90 BASE) MCG/ACT inhaler Inhale 2 puffs into the lungs every 6 (six) hours as needed for wheezing or shortness of breath.   Yes Historical Provider, MD  allopurinol (ZYLOPRIM) 300 MG tablet Take 300 mg by mouth daily.   Yes Historical Provider, MD  apixaban (ELIQUIS) 5 MG TABS tablet Take 5 mg by mouth 2 (two) times daily.   Yes Historical Provider, MD  atorvastatin (LIPITOR) 40 MG tablet Take 40 mg by mouth at bedtime.   Yes Historical Provider, MD  budesonide-formoterol (SYMBICORT) 160-4.5 MCG/ACT inhaler Inhale 2 puffs into the lungs 2 (two) times daily.   Yes Historical Provider, MD  diclofenac sodium (VOLTAREN) 1 % GEL Apply 2 g topically 4 (four) times daily as needed (for pain).   Yes Historical Provider, MD  diltiazem (CARDIZEM CD) 240 MG 24 hr capsule Take 240 mg by mouth daily.   Yes Historical Provider, MD  FLUoxetine (  PROZAC) 40 MG capsule Take 40 mg by mouth daily.   Yes Historical Provider, MD  fluticasone (FLONASE) 50 MCG/ACT nasal spray Place 2 sprays into both nostrils daily.    Yes Historical Provider, MD  furosemide (LASIX) 80 MG tablet Take 80 mg by mouth.   Yes Historical Provider, MD  gabapentin (NEURONTIN) 600 MG tablet Take 1 tablet (600 mg total) by mouth 2 (two) times daily. 06/16/14  Yes Adrian Blackwater Moding, MD  guaiFENesin-codeine (ROBITUSSIN AC) 100-10 MG/5ML  syrup Take 5 mLs by mouth 4 (four) times daily as needed for cough.   Yes Historical Provider, MD  insulin aspart protamine- aspart (NOVOLOG MIX 70/30) (70-30) 100 UNIT/ML injection Inject 45-48 Units into the skin 2 (two) times daily with a meal. Takes 45 units in am and 48 units in pm   Yes Historical Provider, MD  ipratropium-albuterol (DUONEB) 0.5-2.5 (3) MG/3ML SOLN Take 3 mLs by nebulization every 4 (four) hours as needed (for wheezing or shortness of breath).   Yes Historical Provider, MD  loratadine (CLARITIN) 10 MG tablet Take 10 mg by mouth every morning.   Yes Historical Provider, MD  nitrofurantoin, macrocrystal-monohydrate, (MACROBID) 100 MG capsule Take 100 mg by mouth 2 (two) times daily.   Yes Historical Provider, MD  omeprazole (PRILOSEC) 40 MG capsule Take 40 mg by mouth at bedtime.    Yes Historical Provider, MD  OXYGEN Inhale 2-3 L into the lungs continuous.    Yes Historical Provider, MD  polyethylene glycol (MIRALAX / GLYCOLAX) packet Take 17 g by mouth daily. 06/16/14  Yes Adrian Blackwater Moding, MD  theophylline (THEODUR) 300 MG 12 hr tablet Take 300 mg by mouth daily.   Yes Historical Provider, MD  Tiotropium Bromide Monohydrate (SPIRIVA RESPIMAT) 2.5 MCG/ACT AERS Inhale 1 puff into the lungs daily.   Yes Historical Provider, MD  Trospium Chloride 60 MG CP24 Take 60 mg by mouth daily.   Yes Historical Provider, MD  zolpidem (AMBIEN) 5 MG tablet Take 5 mg by mouth at bedtime.   Yes Historical Provider, MD  Cholecalciferol 1000 UNITS capsule Take 1,000 Units by mouth daily.    Historical Provider, MD  clonazePAM (KLONOPIN) 1 MG tablet Take 2 mg by mouth at bedtime.    Historical Provider, MD  dextromethorphan (DELSYM) 30 MG/5ML liquid Take 2.5 mLs (15 mg total) by mouth 2 (two) times daily. Patient not taking: Reported on 09/27/2014 06/16/14   Adrian Blackwater Moding, MD  Magnesium Oxide 250 MG TABS Take 250 mg by mouth daily.    Historical Provider, MD  Menthol, Topical Analgesic, (BIOFREEZE  ROLL-ON) 4 % GEL Apply 1 application topically 2 (two) times daily as needed (for joint pain).    Historical Provider, MD  vitamin B-12 (CYANOCOBALAMIN) 100 MCG tablet Take 100 mcg by mouth daily.    Historical Provider, MD    Allergies  Allergen Reactions  . Other Shortness Of Breath    ROSEMARY AND HEAVY SCENTED PERFUMES, DETERGENTS, ETC  . Tape Itching and Rash    Burns and blisters USE PAPER TAPE  . Celebrex [Celecoxib] Other (See Comments)    HARD TIME HEARING, LIKE BEING UNDERWATER  . Latex Hives, Itching and Rash    Other reaction(s): RASH  . Mysoline [Primidone] Other (See Comments)    unknown  . Niacin And Related Hives and Swelling  . Sulfa Antibiotics Other (See Comments)    TONGUE BLISTERS    . Aspirin Other (See Comments)    GI PROBLEMS AND PAIN  Her family history includes Allergies in her daughter, father, and mother; Asthma in her father; Cancer in her father, mother, and sister; Heart disease in her father; Rheumatologic disease in her father and mother.  She  reports that she has never smoked. She does not have any smokeless tobacco history on file. She reports that she does not drink alcohol.  Review of Systems  Constitutional: Negative for fever and unexpected weight change.  HENT: Positive for congestion, sinus pressure and trouble swallowing. Negative for ear pain, nosebleeds, postnasal drip, rhinorrhea, sneezing and sore throat.   Eyes: Positive for itching. Negative for redness.  Respiratory: Positive for chest tightness and wheezing. Negative for cough and shortness of breath.   Cardiovascular: Positive for leg swelling. Negative for palpitations.  Gastrointestinal: Positive for nausea. Negative for vomiting.  Genitourinary: Negative for dysuria.  Musculoskeletal: Negative for joint swelling.  Skin: Negative for rash.  Neurological: Positive for headaches.  Hematological: Bruises/bleeds easily.  Psychiatric/Behavioral: Negative for dysphoric mood.  The patient is not nervous/anxious.    Physical Exam: Pulse 61  Temp(Src) 97.9 F (36.6 C) (Oral)  Ht  (1.651 m)  Wt 313 lb 9.6 oz (142.248 kg)  BMI 52.19 kg/m2  SpO2 94%  General - No distress, obese, wearing oxygen ENT - No sinus tenderness, no oral exudate, no LAN, no thyromegaly, TM clear, pupils equal/reactive Cardiac - s1s2 regular, no murmur, pulses symmetric Chest - No wheeze/rales/dullness, good air entry, normal respiratory excursion Back - No focal tenderness Abd - Soft, non-tender, no organomegaly, + bowel sounds Ext - No edema Neuro - Normal strength, cranial nerves intact Skin - No rashes Psych - Normal mood, and behavior   Ct Chest Wo Contrast  09/03/2014   CLINICAL DATA:  69 year old female with progressive chronic cough. Chronic oxygen requirement. Subsequent encounter.  EXAM: CT CHEST WITHOUT CONTRAST  TECHNIQUE: Multidetector CT imaging of the chest was performed following the standard protocol without IV contrast.  COMPARISON:  Chest CT 10/26/2012, and earlier.  FINDINGS: Large body habitus. Mild respiratory motion artifact. Major airways are patent. Decreased perihilar opacity compared to 2014. Residual platelike atelectasis in the medial segment of the right middle lobe and along the right major fissure. Linear scarring/ atelectasis in the posterior basal segment of the left lower lobe. Mild bilateral peribronchial thickening. No bronchiectasis or emphysema identified. No pleural effusion or other pulmonary opacity.  Mediastinal lipomatosis. Extensive calcified atherosclerosis of the aorta. Some coronary artery calcified plaque also suspected (series 3, image 25). No pericardial effusion. No mediastinal or hilar lymphadenopathy.  Negative non contrast thoracic inlet. No axillary lymphadenopathy. Negative visualized noncontrast liver, spleen, pancreas, adrenal glands, left renal upper pole, and bowel in the upper abdomen.  Endplate degeneration and spurring in the  thoracic spine. No acute osseous abnormality identified.  IMPRESSION: No acute findings in the chest other than subsegmental atelectasis.  Chronic calcified aortic and probably also coronary atherosclerosis.   Electronically Signed   By: Odessa Fleming M.D.   On: 09/03/2014 13:40    Lab Results  Component Value Date   WBC 9.4 06/13/2014   HGB 10.3* 06/13/2014   HCT 33.0* 06/13/2014   MCV 87.3 06/13/2014   PLT 253 06/13/2014    Lab Results  Component Value Date   CREATININE 1.28* 06/16/2014   BUN 29* 06/16/2014   NA 138 06/16/2014   K 4.5 06/16/2014   CL 99* 06/16/2014   CO2 31 06/16/2014    Lab Results  Component Value Date   ALT  13* 06/13/2014   AST 16 06/13/2014   ALKPHOS 69 06/13/2014   BILITOT 0.4 06/13/2014    Discussion: She has persistent symptoms of dyspnea on exertion.  She denies hx of smoking, but carries a diagnosis of COPD.  She also reports hx of asthma.  She is obese, and lead relatively sedentary lifestyle.  Her recent CT chest showed coronary calcifications.  Her dyspnea is likely multifactorial from possible COPD/asthma, obesity with deconditioning, chronic respiratory failure with obesity hypoventilation syndrome, and possible diastolic dysfunction and coronary artery disease.  Assessment/Plan:  Possible COPD/asthma. Plan: - will add singulair - continue symbicort, spiriva, prn duoneb for now  - will need to discuss whether to continue ipratropium in neb tx, depending on whether she will continue using spiriva - will get copy of records from Dr. Tana Conch office at [facility], [facility], and then determine if additional testing is needed at this time  Allergic rhinitis with post-nasal drip and upper airway cough syndrome. Plan: - add singulair - continue nasal irrigation, flonase, claritin  Obstructive sleep apnea. Plan: - will try to get copy of her previous sleep study results and her CPAP download.  Chronic respiratory failure 2nd to obesity hypoventilation  syndrome. Plan: - she is to continue 3 liters oxygen 24/7 for now  Coronary calcifications on recent CT chest. Hx of A fib, HLD, HTN, Diastolic CHF. Plan: - f/u with primary care  Morbid obesity with deconditioning. Plan: - discussed how her weight is impacting her breathing symptoms - will discuss enrolling her in monitored exercise program at next visit   Coralyn Helling, MD [facility] Pager:  [phone]

## 2014-09-27 NOTE — Progress Notes (Deleted)
   Subjective:    Patient ID: Elaine Lee, female    DOB: 1945-02-24, 70 y.o.   MRN: [id number]  HPI    Review of Systems  Constitutional: Negative for fever and unexpected weight change.  HENT: Positive for congestion, sinus pressure and trouble swallowing. Negative for ear pain, nosebleeds, postnasal drip, rhinorrhea, sneezing and sore throat.   Eyes: Positive for itching. Negative for redness.  Respiratory: Positive for chest tightness and wheezing. Negative for cough and shortness of breath.   Cardiovascular: Positive for leg swelling. Negative for palpitations.  Gastrointestinal: Positive for nausea. Negative for vomiting.  Genitourinary: Negative for dysuria.  Musculoskeletal: Negative for joint swelling.  Skin: Negative for rash.  Neurological: Positive for headaches.  Hematological: Bruises/bleeds easily.  Psychiatric/Behavioral: Negative for dysphoric mood. The patient is not nervous/anxious.        Objective:   Physical Exam        Assessment & Plan:

## 2014-09-27 NOTE — Patient Instructions (Signed)
Will get medical records from Dr. Tana Conch office in [facility]  Singulair 10 mg pill >> take 1 pill nightly before bedtime  Follow up in 4 weeks with Dr. Craige Cotta or Tammy Parrett

## 2014-10-08 ENCOUNTER — Other Ambulatory Visit: Payer: Self-pay | Admitting: Family Medicine

## 2014-10-08 DIAGNOSIS — R1084 Generalized abdominal pain: Secondary | ICD-10-CM

## 2014-10-08 DIAGNOSIS — Z87440 Personal history of urinary (tract) infections: Secondary | ICD-10-CM

## 2014-10-12 ENCOUNTER — Other Ambulatory Visit: Payer: Self-pay | Admitting: Family Medicine

## 2014-10-12 DIAGNOSIS — Z87440 Personal history of urinary (tract) infections: Secondary | ICD-10-CM

## 2014-10-12 DIAGNOSIS — N200 Calculus of kidney: Secondary | ICD-10-CM

## 2014-10-15 ENCOUNTER — Ambulatory Visit
Admission: RE | Admit: 2014-10-15 | Discharge: 2014-10-15 | Disposition: A | Discharge status: Home / Self Care | Payer: No Typology Code available for payment source | Source: Ambulatory Visit | Attending: Family Medicine | Admitting: Family Medicine

## 2014-10-15 DIAGNOSIS — Z87440 Personal history of urinary (tract) infections: Secondary | ICD-10-CM

## 2014-10-15 DIAGNOSIS — N200 Calculus of kidney: Secondary | ICD-10-CM

## 2014-10-25 ENCOUNTER — Ambulatory Visit (INDEPENDENT_AMBULATORY_CARE_PROVIDER_SITE_OTHER): Payer: Medicare (Managed Care) | Admitting: Adult Health

## 2014-10-25 ENCOUNTER — Encounter: Payer: Self-pay | Admitting: Adult Health

## 2014-10-25 VITALS — BP 118/64 | HR 73 | Temp 98.2°F | Ht 65.0 in | Wt 299.0 lb

## 2014-10-25 DIAGNOSIS — J45901 Unspecified asthma with (acute) exacerbation: Secondary | ICD-10-CM | POA: Insufficient documentation

## 2014-10-25 DIAGNOSIS — J4551 Severe persistent asthma with (acute) exacerbation: Secondary | ICD-10-CM | POA: Insufficient documentation

## 2014-10-25 DIAGNOSIS — J449 Chronic obstructive pulmonary disease, unspecified: Secondary | ICD-10-CM

## 2014-10-25 DIAGNOSIS — G4733 Obstructive sleep apnea (adult) (pediatric): Secondary | ICD-10-CM | POA: Insufficient documentation

## 2014-10-25 NOTE — Assessment & Plan Note (Signed)
Restart CPAP At bedtime   Goal is to wear for at least 6hr each night  Do not drive if sleepy.  Follow up Dr. Craige CottaSood  In  3 months and As needed

## 2014-10-25 NOTE — Progress Notes (Signed)
Reviewed and agree with assessment/plan. 

## 2014-10-25 NOTE — Progress Notes (Signed)
Subjective:    Patient ID: Elaine Lee, female    DOB: 07/15/1945, 69 y.o.   MRN: [id number]  HPI 69 yo female never smoker  obese with OSA/OHS  on CPAP /O2 and asthma Previously followed with [place]. Tomasita [place]row[place]au[place] [facility].  Has CHF .   TEST :  Echo 06/14/14 >> EF 55%, PAS 33 mmHg CT chest 09/03/14 >> ATX, bronchial thickening, atherosclerosis   10/25/2014 Follow up : OSA/OHS and Asthma  Pt presents for 1 month follow up .  She was seen last ov for pulmonary consult with Dr. Craige CottaSood  .  She had a recent asthma flare , given steroid taper. She is feeling much better.  Involved in [facility]. Goes 3 days a week .Program with daily exercises, lunch/crafts and access to doctor.  Recently had knee injections , feels so much better. Able walk with walker now.  She is trying to lose weight so she can get knee replacements in soon.  She feels she has made so much success over last month.  She is not using  Oxygen right now. Checks o2 sats often and has stayed above 90%.  She remains on Symbicort and Spiriva .  Has ProAir and Duoneb, has not used in >1 month.  Has not worn CPAP lately, we discussed compliance  Wt is trending down 14lbs since last seen  Denies chest pain, orthopnea, edema or fever.  Spirometry in office shows restrictive changes with no airflow obstruction.  FEV1 at 63%, ratio 81, FVC 59%.      Review of Systems Constitutional:   No  weight loss, night sweats,  Fevers, chills, + fatigue, or  lassitude.  HEENT:   No headaches,  Difficulty swallowing,  Tooth/dental problems, or  Sore throat,                No sneezing, itching, ear ache, nasal congestion, post nasal drip,   CV:  No chest pain,  Orthopnea, PND, swelling in lower extremities, anasarca, dizziness, palpitations, syncope.   GI  No heartburn, indigestion, abdominal pain, nausea, vomiting, diarrhea, change in bowel habits, loss of appetite, bloody stools.   Resp:    No excess mucus, no productive  cough,  No non-productive cough,  No coughing up of blood.  No change in color of mucus.  No wheezing.  No chest wall deformity  Skin: no rash or lesions.  GU: no dysuria, change in color of urine, no urgency or frequency.  No flank pain, no hematuria   MS:  No joint pain or swelling.  No decreased range of motion.  No back pain.  Psych:  No change in mood or affect. No depression or anxiety.  No memory loss.         Objective:   Physical Exam GEN: A/Ox3; pleasant , NAD, obese   HEENT:  [place]/AT,  EACs-clear, TMs-wnl, NOSE-clear, THROAT-clear, no lesions, no postnasal drip or exudate noted.   NECK:  Supple w/ fair ROM; no JVD; normal carotid impulses w/o bruits; no thyromegaly or nodules palpated; no lymphadenopathy.  RESP  Decreased BS in bases , no accessory muscle use, no dullness to percussion  CARD:  RRR, no m/r/g  , no peripheral edema, pulses intact, no cyanosis or clubbing.  GI:   Soft & nt; nml bowel sounds; no organomegaly or masses detected.  Musco: Warm bil, no deformities or joint swelling noted.   Neuro: alert, no focal deficits noted.    Skin: Warm, no  lesions or rashes         Assessment & Plan:

## 2014-10-25 NOTE — Patient Instructions (Addendum)
May stop Spiriva .  Continue on Symbicort  2 puffs Twice daily   Restart CPAP At bedtime   Goal is to wear for at least 6hr each night  Do not drive if sleepy.  Follow up Dr. Craige CottaSood  In  3 months and As needed

## 2014-10-25 NOTE — Assessment & Plan Note (Signed)
Recent flare now resolved .  Spirometry shows restrictive disease ? Obesity related.  She is a never smoker  Will try off Spiriva for now .  Cont on symbicort   Plan  May stop Spiriva .  Continue on Symbicort  2 puffs Twice daily   Restart CPAP At bedtime   Goal is to wear for at least 6hr each night  Do not drive if sleepy.  Follow up Dr. Craige CottaSood  In  3 months and As needed

## 2014-10-25 NOTE — Assessment & Plan Note (Signed)
Wt loss encouraged  

## 2014-12-31 ENCOUNTER — Ambulatory Visit
Admission: RE | Admit: 2014-12-31 | Discharge: 2014-12-31 | Disposition: A | Discharge status: Home / Self Care | Payer: Medicare (Managed Care) | Source: Ambulatory Visit | Attending: Internal Medicine | Admitting: Internal Medicine

## 2014-12-31 ENCOUNTER — Other Ambulatory Visit: Payer: Self-pay | Admitting: Internal Medicine

## 2014-12-31 DIAGNOSIS — R05 Cough: Secondary | ICD-10-CM

## 2014-12-31 DIAGNOSIS — R0600 Dyspnea, unspecified: Secondary | ICD-10-CM

## 2014-12-31 DIAGNOSIS — R058 Other specified cough: Secondary | ICD-10-CM

## 2014-12-31 DIAGNOSIS — R062 Wheezing: Secondary | ICD-10-CM

## 2015-01-25 ENCOUNTER — Encounter: Payer: Self-pay | Admitting: Adult Health

## 2015-01-25 ENCOUNTER — Ambulatory Visit (INDEPENDENT_AMBULATORY_CARE_PROVIDER_SITE_OTHER): Payer: Medicare (Managed Care) | Admitting: Adult Health

## 2015-01-25 VITALS — BP 110/64 | HR 67 | Temp 98.7°F | Ht 65.0 in | Wt 313.0 lb

## 2015-01-25 DIAGNOSIS — G4733 Obstructive sleep apnea (adult) (pediatric): Secondary | ICD-10-CM

## 2015-01-25 DIAGNOSIS — J45901 Unspecified asthma with (acute) exacerbation: Secondary | ICD-10-CM | POA: Diagnosis not present

## 2015-01-25 MED ORDER — AZITHROMYCIN 250 MG PO TABS
ORAL_TABLET | ORAL | Status: AC
Start: 2015-01-25 — End: 2015-01-30

## 2015-01-25 NOTE — Progress Notes (Signed)
Subjective:    Patient ID: Elaine Lee, female    DOB: Mar 05, 1945, 70 y.o.   MRN: [id number]  HPI  70 yo female never smoker  obese with OSA/OHS  on CPAP /O2 and asthma Previously followed with Dr. Tomasita Morrow in [facility].     TEST :  Echo 06/14/14 >> EF 55%, PAS 33 mmHg CT chest 09/03/14 >> ATX, bronchial thickening, atherosclerosis 2016  Spirometry in office shows restrictive changes with no airflow obstruction.  FEV1 at 63%, ratio 81, FVC 59%.    01/25/2015 Follow up : OSA/OHS and Asthma  Pt presents for 3 month follow up .  Complains over last few weeks of prod cough with yellow mucus, sinus pressure/drainage, SOB with activity, nausea and wheezing at times.  Denies any chest tightness/congestion, fever or vomiting.or hemoptysis   Uses CPAP on average 6 hours a night, mask fits fine. No recent download. Says she is wearing it more.  Still feels tired during daytime.  Involved in [facility]. Goes 3 days a week .Program with daily exercises, lunch/crafts and access to doctor. Hard to exercise due to knee issue.  Walks with walker now.  Went to dietician this morning to help with diet.  She is trying to lose weight so she can get knee replacements in soon.  She remains on Symbicort.      Review of Systems  Constitutional:   No  weight loss, night sweats,  Fevers, chills, + fatigue, or  lassitude.  HEENT:   No headaches,  Difficulty swallowing,  Tooth/dental problems, or  Sore throat,                No sneezing, itching, ear ache,  +nasal congestion, post nasal drip,   CV:  No chest pain,  Orthopnea, PND, , anasarca, dizziness, palpitations, syncope.   GI  No heartburn, indigestion, abdominal pain, nausea, vomiting, diarrhea, change in bowel habits, loss of appetite, bloody stools.   Resp:       No chest wall deformity  Skin: no rash or lesions.  GU: no dysuria, change in color of urine, no urgency or frequency.  No flank pain, no hematuria   MS:  No  joint pain or swelling.  No decreased range of motion.  No back pain.  Psych:  No change in mood or affect. No depression or anxiety.  No memory loss.         Objective:   Physical Exam  Filed Vitals:   01/25/15 1408  BP: 110/64  Pulse: 67  Temp: 98.7 F (37.1 C)  TempSrc: Oral  Height:  (1.651 m)  Weight: 313 lb (141.976 kg)  SpO2: 95%   Body mass index is 52.09 kg/(m^2).   GEN: A/Ox3; pleasant , NAD, morbidly obese ,   HEENT:  [place]/AT,  EACs-clear, TMs-wnl, NOSE-clear, THROAT-clear, no lesions, no postnasal drip or exudate noted. Class 2-3 MP airway  NECK:  Supple w/ fair ROM; no JVD; normal carotid impulses w/o bruits; no thyromegaly or nodules palpated; no lymphadenopathy.  RESP  Decreased BS in bases , no accessory muscle use, no dullness to percussion  CARD:  RRR, no m/r/g  ,tr  peripheral edema, pulses intact, no cyanosis or clubbing.  GI:   Soft & nt; nml bowel sounds; no organomegaly or masses detected.  Musco: Warm bil, no deformities or joint swelling noted.   Neuro: alert, no focal deficits noted.    Skin: Warm, no lesions or rashes  Assessment & Plan:

## 2015-01-25 NOTE — Patient Instructions (Addendum)
Continue on Symbicort  2 puffs Twice daily   Continue on CPAP At bedtime   CPAP download.  Goal is to wear for at least 6hr each night  Do not drive if sleepy.  Work on weight loss.  Zpack take as directed .  Follow up Dr. Craige Cotta  In  3 months and As needed   Please contact office for sooner follow up if symptoms do not improve or worsen or seek emergency care

## 2015-01-30 NOTE — Assessment & Plan Note (Signed)
Wt loss encouraged  

## 2015-01-30 NOTE — Assessment & Plan Note (Signed)
Continue on CPAP At bedtime   CPAP download.  Goal is to wear for at least 6hr each night  Do not drive if sleepy.  Work on weight loss.  Follow up Dr. Craige Cotta  In  3 months and As needed   Please contact office for sooner follow up if symptoms do not improve or worsen or seek emergency care

## 2015-01-30 NOTE — Assessment & Plan Note (Addendum)
Mild flare with URI Zpack take as directed .  Follow up Dr. Craige Cotta  In  3 months and As needed   Please contact office for sooner follow up if symptoms do not improve or worsen or seek emergency care

## 2015-03-18 ENCOUNTER — Ambulatory Visit
Admission: RE | Admit: 2015-03-18 | Discharge: 2015-03-18 | Disposition: A | Discharge status: Home / Self Care | Payer: Medicare (Managed Care) | Source: Ambulatory Visit | Attending: Internal Medicine | Admitting: Internal Medicine

## 2015-03-18 ENCOUNTER — Other Ambulatory Visit: Payer: Self-pay | Admitting: Internal Medicine

## 2015-03-18 DIAGNOSIS — J4551 Severe persistent asthma with (acute) exacerbation: Secondary | ICD-10-CM

## 2015-04-07 ENCOUNTER — Other Ambulatory Visit: Payer: Self-pay | Admitting: Internal Medicine

## 2015-04-07 ENCOUNTER — Ambulatory Visit
Admission: RE | Admit: 2015-04-07 | Discharge: 2015-04-07 | Disposition: A | Discharge status: Home / Self Care | Payer: Medicare (Managed Care) | Source: Ambulatory Visit | Attending: Internal Medicine | Admitting: Internal Medicine

## 2015-04-07 DIAGNOSIS — M5489 Other dorsalgia: Secondary | ICD-10-CM

## 2015-05-03 ENCOUNTER — Ambulatory Visit: Payer: Medicare (Managed Care) | Admitting: Pulmonary Disease

## 2015-06-03 ENCOUNTER — Other Ambulatory Visit: Payer: Self-pay | Admitting: Internal Medicine

## 2015-06-03 ENCOUNTER — Ambulatory Visit
Admission: RE | Admit: 2015-06-03 | Discharge: 2015-06-03 | Disposition: A | Discharge status: Home / Self Care | Payer: Medicare (Managed Care) | Source: Ambulatory Visit | Attending: Internal Medicine | Admitting: Internal Medicine

## 2015-06-03 DIAGNOSIS — R0602 Shortness of breath: Secondary | ICD-10-CM

## 2016-03-09 ENCOUNTER — Ambulatory Visit
Admission: RE | Admit: 2016-03-09 | Discharge: 2016-03-09 | Disposition: A | Discharge status: Home / Self Care | Payer: Medicare (Managed Care) | Source: Ambulatory Visit | Attending: Internal Medicine | Admitting: Internal Medicine

## 2016-03-09 ENCOUNTER — Other Ambulatory Visit: Payer: Self-pay | Admitting: Internal Medicine

## 2016-03-09 DIAGNOSIS — M533 Sacrococcygeal disorders, not elsewhere classified: Secondary | ICD-10-CM

## 2016-04-04 ENCOUNTER — Inpatient Hospital Stay (HOSPITAL_COMMUNITY)
Admission: EM | Admit: 2016-04-04 | Discharge: 2016-04-06 | DRG: 690 | Disposition: A | Discharge status: Skilled Nursing Facility | Payer: Medicare (Managed Care) | Attending: Student in an Organized Health Care Education/Training Program | Admitting: Student in an Organized Health Care Education/Training Program

## 2016-04-04 ENCOUNTER — Encounter (HOSPITAL_COMMUNITY): Payer: Self-pay | Admitting: Emergency Medicine

## 2016-04-04 ENCOUNTER — Emergency Department (HOSPITAL_COMMUNITY): Payer: Medicare (Managed Care)

## 2016-04-04 DIAGNOSIS — M109 Gout, unspecified: Secondary | ICD-10-CM | POA: Diagnosis present

## 2016-04-04 DIAGNOSIS — Z90710 Acquired absence of both cervix and uterus: Secondary | ICD-10-CM

## 2016-04-04 DIAGNOSIS — F329 Major depressive disorder, single episode, unspecified: Secondary | ICD-10-CM | POA: Diagnosis present

## 2016-04-04 DIAGNOSIS — Z809 Family history of malignant neoplasm, unspecified: Secondary | ICD-10-CM

## 2016-04-04 DIAGNOSIS — J449 Chronic obstructive pulmonary disease, unspecified: Secondary | ICD-10-CM | POA: Diagnosis present

## 2016-04-04 DIAGNOSIS — G253 Myoclonus: Secondary | ICD-10-CM | POA: Diagnosis present

## 2016-04-04 DIAGNOSIS — R251 Tremor, unspecified: Secondary | ICD-10-CM | POA: Diagnosis present

## 2016-04-04 DIAGNOSIS — Z89412 Acquired absence of left great toe: Secondary | ICD-10-CM

## 2016-04-04 DIAGNOSIS — Z882 Allergy status to sulfonamides status: Secondary | ICD-10-CM

## 2016-04-04 DIAGNOSIS — R296 Repeated falls: Secondary | ICD-10-CM | POA: Diagnosis present

## 2016-04-04 DIAGNOSIS — N183 Chronic kidney disease, stage 3 unspecified: Secondary | ICD-10-CM | POA: Diagnosis present

## 2016-04-04 DIAGNOSIS — H9190 Unspecified hearing loss, unspecified ear: Secondary | ICD-10-CM | POA: Diagnosis present

## 2016-04-04 DIAGNOSIS — I482 Chronic atrial fibrillation: Secondary | ICD-10-CM | POA: Diagnosis present

## 2016-04-04 DIAGNOSIS — B964 Proteus (mirabilis) (morganii) as the cause of diseases classified elsewhere: Secondary | ICD-10-CM | POA: Diagnosis present

## 2016-04-04 DIAGNOSIS — M1990 Unspecified osteoarthritis, unspecified site: Secondary | ICD-10-CM | POA: Diagnosis present

## 2016-04-04 DIAGNOSIS — E860 Dehydration: Secondary | ICD-10-CM | POA: Diagnosis present

## 2016-04-04 DIAGNOSIS — I130 Hypertensive heart and chronic kidney disease with heart failure and stage 1 through stage 4 chronic kidney disease, or unspecified chronic kidney disease: Secondary | ICD-10-CM | POA: Diagnosis present

## 2016-04-04 DIAGNOSIS — E1140 Type 2 diabetes mellitus with diabetic neuropathy, unspecified: Secondary | ICD-10-CM | POA: Diagnosis present

## 2016-04-04 DIAGNOSIS — Z8249 Family history of ischemic heart disease and other diseases of the circulatory system: Secondary | ICD-10-CM

## 2016-04-04 DIAGNOSIS — Z6841 Body Mass Index (BMI) 40.0 and over, adult: Secondary | ICD-10-CM

## 2016-04-04 DIAGNOSIS — Z825 Family history of asthma and other chronic lower respiratory diseases: Secondary | ICD-10-CM

## 2016-04-04 DIAGNOSIS — I509 Heart failure, unspecified: Secondary | ICD-10-CM | POA: Diagnosis present

## 2016-04-04 DIAGNOSIS — K219 Gastro-esophageal reflux disease without esophagitis: Secondary | ICD-10-CM | POA: Diagnosis present

## 2016-04-04 DIAGNOSIS — M545 Low back pain: Secondary | ICD-10-CM | POA: Diagnosis present

## 2016-04-04 DIAGNOSIS — G4733 Obstructive sleep apnea (adult) (pediatric): Secondary | ICD-10-CM | POA: Diagnosis present

## 2016-04-04 DIAGNOSIS — Z888 Allergy status to other drugs, medicaments and biological substances status: Secondary | ICD-10-CM

## 2016-04-04 DIAGNOSIS — N179 Acute kidney failure, unspecified: Secondary | ICD-10-CM | POA: Diagnosis present

## 2016-04-04 DIAGNOSIS — N390 Urinary tract infection, site not specified: Principal | ICD-10-CM | POA: Diagnosis present

## 2016-04-04 DIAGNOSIS — Z7901 Long term (current) use of anticoagulants: Secondary | ICD-10-CM

## 2016-04-04 DIAGNOSIS — W19XXXA Unspecified fall, initial encounter: Secondary | ICD-10-CM | POA: Diagnosis present

## 2016-04-04 DIAGNOSIS — Z79899 Other long term (current) drug therapy: Secondary | ICD-10-CM

## 2016-04-04 DIAGNOSIS — R0781 Pleurodynia: Secondary | ICD-10-CM

## 2016-04-04 DIAGNOSIS — D649 Anemia, unspecified: Secondary | ICD-10-CM | POA: Diagnosis present

## 2016-04-04 DIAGNOSIS — Z87440 Personal history of urinary (tract) infections: Secondary | ICD-10-CM

## 2016-04-04 DIAGNOSIS — E1122 Type 2 diabetes mellitus with diabetic chronic kidney disease: Secondary | ICD-10-CM | POA: Diagnosis present

## 2016-04-04 LAB — CBC WITH DIFFERENTIAL/PLATELET
BASOS PCT: 0 %
Basophils Absolute: 0 10*3/uL (ref 0.0–0.1)
Eosinophils Absolute: 0.2 10*3/uL (ref 0.0–0.7)
Eosinophils Relative: 2 %
HCT: 29.7 % — ABNORMAL LOW (ref 36.0–46.0)
HEMOGLOBIN: 9.2 g/dL — AB (ref 12.0–15.0)
LYMPHS PCT: 13 %
Lymphs Abs: 2 10*3/uL (ref 0.7–4.0)
MCH: 27.5 pg (ref 26.0–34.0)
MCHC: 31 g/dL (ref 30.0–36.0)
MCV: 88.9 fL (ref 78.0–100.0)
MONO ABS: 1.6 10*3/uL — AB (ref 0.1–1.0)
Monocytes Relative: 11 %
NEUTROS PCT: 74 %
Neutro Abs: 11.2 10*3/uL — ABNORMAL HIGH (ref 1.7–7.7)
Platelets: 172 10*3/uL (ref 150–400)
RBC: 3.34 MIL/uL — ABNORMAL LOW (ref 3.87–5.11)
RDW: 16.5 % — ABNORMAL HIGH (ref 11.5–15.5)
WBC: 15.1 10*3/uL — ABNORMAL HIGH (ref 4.0–10.5)

## 2016-04-04 LAB — I-STAT TROPONIN, ED
TROPONIN I, POC: 0 ng/mL (ref 0.00–0.08)
Troponin i, poc: 0 ng/mL (ref 0.00–0.08)

## 2016-04-04 LAB — URINALYSIS, ROUTINE W REFLEX MICROSCOPIC
BILIRUBIN URINE: NEGATIVE
GLUCOSE, UA: NEGATIVE mg/dL
HGB URINE DIPSTICK: NEGATIVE
KETONES UR: NEGATIVE mg/dL
LEUKOCYTES UA: NEGATIVE
NITRITE: POSITIVE — AB
PH: 8 (ref 5.0–8.0)
Protein, ur: 30 mg/dL — AB
Specific Gravity, Urine: 1.017 (ref 1.005–1.030)

## 2016-04-04 LAB — COMPREHENSIVE METABOLIC PANEL
ALK PHOS: 60 U/L (ref 38–126)
ALT: 23 U/L (ref 14–54)
AST: 16 U/L (ref 15–41)
Albumin: 3 g/dL — ABNORMAL LOW (ref 3.5–5.0)
Anion gap: 7 (ref 5–15)
BUN: 37 mg/dL — ABNORMAL HIGH (ref 6–20)
CALCIUM: 8.7 mg/dL — AB (ref 8.9–10.3)
CO2: 29 mmol/L (ref 22–32)
Chloride: 103 mmol/L (ref 101–111)
Creatinine, Ser: 1.63 mg/dL — ABNORMAL HIGH (ref 0.44–1.00)
GFR calc non Af Amer: 30 mL/min — ABNORMAL LOW (ref >60)
GFR, EST AFRICAN AMERICAN: 35 mL/min — AB (ref >60)
GLUCOSE: 163 mg/dL — AB (ref 65–99)
POTASSIUM: 4.3 mmol/L (ref 3.5–5.1)
SODIUM: 139 mmol/L (ref 135–145)
TOTAL PROTEIN: 6 g/dL — AB (ref 6.5–8.1)
Total Bilirubin: 0.9 mg/dL (ref 0.3–1.2)

## 2016-04-04 LAB — BRAIN NATRIURETIC PEPTIDE: B Natriuretic Peptide: 184.8 pg/mL — ABNORMAL HIGH (ref 0.0–100.0)

## 2016-04-04 LAB — I-STAT CG4 LACTIC ACID, ED: LACTIC ACID, VENOUS: 1.01 mmol/L (ref 0.5–1.9)

## 2016-04-04 MED ORDER — DEXTROSE 5 % IV SOLN
1.0000 g | INTRAVENOUS | Status: DC
  Administered 2016-04-04 – 2016-04-05 (×2): 1 g via INTRAVENOUS
  Filled 2016-04-04 (×3): qty 10

## 2016-04-04 MED ORDER — FENTANYL CITRATE (PF) 100 MCG/2ML IJ SOLN
50.0000 ug | Freq: Once | INTRAMUSCULAR | Status: AC
  Administered 2016-04-04: 50 ug via INTRAVENOUS
  Filled 2016-04-04: qty 2

## 2016-04-04 NOTE — ED Notes (Signed)
Pharmacy tech at bedside 

## 2016-04-04 NOTE — ED Notes (Signed)
Patient transported to X-ray 

## 2016-04-04 NOTE — ED Provider Notes (Signed)
[facility]-EMERGENCY DEPT Provider Note   CSN: [id number] Arrival date & time: 04/04/16  1154     History   Chief Complaint Chief Complaint  Patient presents with  . Back Pain    HPI [name] is a 71 y.o. female.  The history is provided by the patient. No language interpreter was used.  Back Pain     ROMONA Lee is a 71 y.o. female who presents to the Emergency Department complaining of multiple complaints.  She presents from referral from her primary care provider for multiple complaints. She reports 1 week of bilateral low back pain with associated shaking and jerking sensation all 4 charities. Last night she had shaking and jerking and she fell, landing on her right knee. She experienced pain in her right knee. She denies any head injury or loss of consciousness. Since the fall she reports right upper back pain that is worse with deep breaths and associated shortness of breath. She denies any fevers, nausea, vomiting, abdominal pain, diarrhea. Past Medical History:  Diagnosis Date  . A-fib (HCC)   . Asthma   . CHF (congestive heart failure) (HCC)   . Chronic edema   . CKD (chronic kidney disease), stage III   . COPD (chronic obstructive pulmonary disease) (HCC)   . Degenerative lumbar disc   . Depression   . Diabetes mellitus without complication (HCC)   . Diabetic neuropathy (HCC)   . GERD (gastroesophageal reflux disease)   . Gout   . Hearing loss   . Hypercholesteremia   . Hypertension   . Insomnia   . Obstructive sleep apnea on CPAP   . Osteoarthritis    knees and shoulders  . Renal disorder   . Urinary frequency     Patient Active Problem List   Diagnosis Date Noted  . Acute UTI 04/04/2016  . Asthma flare 10/25/2014  . OSA (obstructive sleep apnea) 10/25/2014  . Morbid obesity (HCC) 10/25/2014  . Acute on chronic respiratory failure with hypoxia (HCC)   . DM2 (diabetes mellitus, type 2) (HCC) 06/14/2014  . CKD (chronic kidney disease)  06/14/2014  . Neuropathic pain 06/14/2014  . HTN (hypertension) 06/14/2014  . Type 2 diabetes with stage 3 chronic kidney disease GFR 30-59 (HCC)   . COPD exacerbation (HCC) 06/13/2014  . Acute exacerbation of CHF (congestive heart failure) (HCC) 06/13/2014    Past Surgical History:  Procedure Laterality Date  . ABDOMINAL HYSTERECTOMY      OB History    No data available       Home Medications    Prior to Admission medications   Medication Sig Start Date End Date Taking? Authorizing Provider  acetaminophen (TYLENOL) 325 MG tablet Take 650 mg by mouth every 8 (eight) hours.    Yes Historical Provider, MD  antiseptic oral rinse (BIOTENE) LIQD 15 mLs by Mouth Rinse route as needed for dry mouth.   Yes Historical Provider, MD  apixaban (ELIQUIS) 5 MG TABS tablet Take 5 mg by mouth 2 (two) times daily.   Yes Historical Provider, MD  atorvastatin (LIPITOR) 10 MG tablet Take 10 mg by mouth daily.   Yes Historical Provider, MD  budesonide-formoterol (SYMBICORT) 160-4.5 MCG/ACT inhaler Inhale 2 puffs into the lungs 2 (two) times daily.   Yes Historical Provider, MD  citalopram (CELEXA) 20 MG tablet Take 20 mg by mouth daily.   Yes Historical Provider, MD  diltiazem (CARDIZEM CD) 240 MG 24 hr capsule Take 240 mg by mouth daily.  Yes Historical Provider, MD  fesoterodine (TOVIAZ) 8 MG TB24 tablet Take 8 mg by mouth daily.   Yes Historical Provider, MD  furosemide (LASIX) 20 MG tablet Take 20 mg by mouth daily.   Yes Historical Provider, MD  furosemide (LASIX) 40 MG tablet Take 40 mg by mouth 2 (two) times daily.   Yes Historical Provider, MD  gabapentin (NEURONTIN) 600 MG tablet Take 1 tablet (600 mg total) by mouth 2 (two) times daily. Patient taking differently: Take 600-1,200 mg by mouth 2 (two) times daily. Take 1 tablet in the am and Take 2 tablets in the evening. 06/16/14  Yes Adrian Blackwater Moding, MD  guaiFENesin (MUCINEX) 600 MG 12 hr tablet Take by mouth 2 (two) times daily as needed for  cough.   Yes Historical Provider, MD  hydrocortisone (ANUSOL-HC) 25 MG suppository Place 25 mg rectally 2 (two) times daily as needed for hemorrhoids or itching.   Yes Historical Provider, MD  insulin aspart protamine- aspart (NOVOLOG MIX 70/30) (70-30) 100 UNIT/ML injection Inject 14-25 Units into the skin 2 (two) times daily with a meal. Takes 25 units in am and 14 units in pm   Yes Historical Provider, MD  ipratropium-albuterol (DUONEB) 0.5-2.5 (3) MG/3ML SOLN Take 3 mLs by nebulization 4 (four) times daily.   Yes Historical Provider, MD  liraglutide (VICTOZA) 18 MG/3ML SOPN Inject 1.8 mg into the skin daily.   Yes Historical Provider, MD  loratadine (CLARITIN) 10 MG tablet Take 10 mg by mouth every morning.   Yes Historical Provider, MD  losartan (COZAAR) 100 MG tablet Take 100 mg by mouth daily.   Yes Historical Provider, MD  Melatonin 5 MG TABS Take 1 tablet by mouth at bedtime.   Yes Historical Provider, MD  Meth-Hyo-M Bl-Na Phos-Ph Sal (URIBEL) 118 MG CAPS Take 1 capsule by mouth 4 (four) times daily as needed (bladder spasms).   Yes Historical Provider, MD  montelukast (SINGULAIR) 10 MG tablet Take 1 tablet (10 mg total) by mouth at bedtime. 09/27/14  Yes Coralyn Helling, MD  nystatin (NYSTATIN) powder Apply 1 Bottle topically 4 (four) times daily as needed (skin folds).   Yes Historical Provider, MD  omeprazole (PRILOSEC) 20 MG capsule Take 20 mg by mouth daily.   Yes Historical Provider, MD  OXYGEN Inhale 2-3 L into the lungs daily as needed (bedtime).    Yes Historical Provider, MD  potassium chloride (K-DUR,KLOR-CON) 10 MEQ tablet Take 10 mEq by mouth daily.   Yes Historical Provider, MD  simethicone (MYLICON) 125 MG chewable tablet Chew 125 mg by mouth every 6 (six) hours as needed for flatulence.   Yes Historical Provider, MD  traMADol (ULTRAM) 50 MG tablet Take 50 mg by mouth every 6 (six) hours as needed for moderate pain or severe pain.   Yes Historical Provider, MD  albuterol (PROVENTIL  HFA;VENTOLIN HFA) 108 (90 BASE) MCG/ACT inhaler Inhale 2 puffs into the lungs every 6 (six) hours as needed for wheezing or shortness of breath.    Historical Provider, MD  Menthol, Topical Analgesic, (BIOFREEZE ROLL-ON) 4 % GEL Apply 1 application topically 2 (two) times daily as needed (for joint pain).    Historical Provider, MD  phenazopyridine (PYRIDIUM) 200 MG tablet Take 200 mg by mouth 3 (three) times daily as needed (urinary burning).    Historical Provider, MD  polyethylene glycol (MIRALAX / GLYCOLAX) packet Take 17 g by mouth daily. Patient taking differently: Take 17 g by mouth daily as needed for moderate constipation.  06/16/14   Donavan Foil, MD    Family History Family History  Problem Relation Age of Onset  . Allergies Mother   . Rheumatologic disease Mother   . Cancer Mother   . Allergies Father   . Asthma Father   . Heart disease Father   . Rheumatologic disease Father   . Cancer Father   . Allergies Daughter   . Cancer Sister     Social History Social History  Substance Use Topics  . Smoking status: Never Smoker  . Smokeless tobacco: Not on file  . Alcohol use No     Allergies   Other; Tape; Celebrex [celecoxib]; Latex; Mysoline [primidone]; Niacin and related; Sulfa antibiotics; and Aspirin   Review of Systems Review of Systems  Musculoskeletal: Positive for back pain.  All other systems reviewed and are negative.    Physical Exam Updated Vital Signs BP (!) 117/54 (BP Location: Right Arm) Comment: RN notified  Pulse 75   Temp 98.3 F (36.8 C) (Oral)   Resp 16   Ht  (1.651 m)   Wt 276 lb 8 oz (125.4 kg)   SpO2 95%   BMI 46.01 kg/m   Physical Exam  Constitutional: She is oriented to person, place, and time. She appears well-developed and well-nourished.  HENT:  Head: Normocephalic and atraumatic.  Cardiovascular: Normal rate and regular rhythm.   No murmur heard. Pulmonary/Chest: Effort normal and breath sounds normal. No  respiratory distress.  Abdominal: Soft. There is no rebound and no guarding.  Obese abdomen with mild diffuse tenderness. There are multiple areas of healing ecchymosis along the lower abdominal wall. Patient states these areas are related to insulin injections.  Musculoskeletal:  1+ pitting edema to bilateral lower extremities. No tenderness to bilateral hips. There is pain with range of motion to the right knee but no palpable tenderness on examination. She has pain to the right posterior back and right lower back that is not reproducible on palpation.  Neurological: She is alert and oriented to person, place, and time.  4+ out of 5 strength in all 4 extremities.  Skin: Skin is warm and dry.  Psychiatric: She has a normal mood and affect. Her behavior is normal.  Nursing note and vitals reviewed.    ED Treatments / Results  Labs (all labs ordered are listed, but only abnormal results are displayed) Labs Reviewed  COMPREHENSIVE METABOLIC PANEL - Abnormal; Notable for the following:       Result Value   Glucose, Bld 163 (*)    BUN 37 (*)    Creatinine, Ser 1.63 (*)    Calcium 8.7 (*)    Total Protein 6.0 (*)    Albumin 3.0 (*)    GFR calc non Af Amer 30 (*)    GFR calc Af Amer 35 (*)    All other components within normal limits  CBC WITH DIFFERENTIAL/PLATELET - Abnormal; Notable for the following:    WBC 15.1 (*)    RBC 3.34 (*)    Hemoglobin 9.2 (*)    HCT 29.7 (*)    RDW 16.5 (*)    Neutro Abs 11.2 (*)    Monocytes Absolute 1.6 (*)    All other components within normal limits  URINALYSIS, ROUTINE W REFLEX MICROSCOPIC - Abnormal; Notable for the following:    Color, Urine AMBER (*)    APPearance CLOUDY (*)    Protein, ur 30 (*)    Nitrite POSITIVE (*)    Bacteria, UA  MANY (*)    Squamous Epithelial / LPF 0-5 (*)    All other components within normal limits  BRAIN NATRIURETIC PEPTIDE - Abnormal; Notable for the following:    B Natriuretic Peptide 184.8 (*)    All other  components within normal limits  CULTURE, BLOOD (ROUTINE X 2)  CULTURE, BLOOD (ROUTINE X 2)  URINE CULTURE  BASIC METABOLIC PANEL  CBC  TROPONIN I  I-STAT TROPOININ, ED  I-STAT TROPOININ, ED  I-STAT CG4 LACTIC ACID, ED    EKG  EKG Interpretation  Date/Time:  Wednesday April 04 2016 16:51:17 EDT Ventricular Rate:  63 PR Interval:    QRS Duration: 92 QT Interval:  401 QTC Calculation: 411 R Axis:   -10 Text Interpretation:  Atrial fibrillation Ventricular premature complex Low voltage, extremity and precordial leads Nonspecific T abnormalities, lateral leads Confirmed by Lincoln Brigham [phone]) on 04/04/2016 5:23:25 PM       Radiology Dg Chest 2 View  Result Date: 04/04/2016 CLINICAL DATA:  Patient fell. Cough. History of asthma. Morbid obesity. EXAM: CHEST  2 VIEW COMPARISON:  06/03/2015. FINDINGS: AP semierect radiograph demonstrates marked enlargement of the cardiac silhouette, increased from priors. Mild vascular congestion without visible pneumothorax, overt failure, or consolidation. Increased body habitus. IMPRESSION: Worsening aeration. Mild vascular congestion. Marked enlargement of cardiac silhouette, increased from priors. Electronically Signed   By: Elsie Stain M.D.   On: 04/04/2016 17:59   Dg Lumbar Spine Complete  Result Date: 04/04/2016 CLINICAL DATA:  71 year old who fell yesterday when her knees gave out. Patient complains of low back pain and right knee pain. Initial encounter. EXAM: LUMBAR SPINE - COMPLETE 4+ VIEW COMPARISON:  04/07/2015 and bone window images from CT abdomen and pelvis 10/15/2014, 08/13/2009. FINDINGS: Five non-rib-bearing lumbar vertebrae. Grade 2 spondylolisthesis of L4 on L5 measuring approximately 1.5 cm, likely degenerative in nature, as there are no visible pars defects. Degenerative disc disease with vacuum disc phenomenon at L4-5. Moderate disc space narrowing at L1-2 and mild disc space narrowing at L2-3. Facet degenerative changes from L3-4  through L5-S1. Osseous demineralization. No fractures. Visualized sacroiliac joints intact. Aortoiliac atherosclerosis without evidence of aneurysm. IMPRESSION: 1. No acute osseous abnormality. 2. Degenerative grade 2 spondylolisthesis of L4 on L5 measuring approximately 1.5 cm. 3. Degenerative disc disease at L4-5, L1-2 and L2-3. 4. Facet degenerative changes from L3-4 through L5-S1. 5. Aortoiliac atherosclerosis. Electronically Signed   By: Hulan Saas M.D.   On: 04/04/2016 18:02   Dg Knee Complete 4 Views Right  Result Date: 04/04/2016 CLINICAL DATA:  71 year old who fell yesterday when her knees gave out. Patient complains of low back pain and right knee pain. Initial encounter. EXAM: RIGHT KNEE - COMPLETE 4+ VIEW COMPARISON:  09/18/2014. FINDINGS: No evidence of acute fracture or dislocation. Tricompartment joint space narrowing, severe in the lateral compartment and moderate in the medial and patellofemoral compartments, unchanged. Generalized osseous demineralization. No visible effusion. IMPRESSION: 1. No acute osseous abnormality. 2. 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 3. Osseous demineralization. Electronically Signed   By: Hulan Saas M.D.   On: 04/04/2016 18:05    Procedures Procedures (including critical care time)  Medications Ordered in ED Medications  cefTRIAXone (ROCEPHIN) 1 g in dextrose 5 % 50 mL IVPB (0 g Intravenous Stopped 04/04/16 2222)  sodium chloride flush (NS) 0.9 % injection 3 mL (not administered)  acetaminophen (TYLENOL) tablet 650 mg (not administered)    Or  acetaminophen (TYLENOL) suppository 650 mg (not administered)  polyethylene glycol (MIRALAX / GLYCOLAX) packet 17 g (not administered)  apixaban (ELIQUIS) tablet 5 mg (not administered)  atorvastatin (LIPITOR) tablet 10 mg (not administered)  loratadine (CLARITIN) tablet 10 mg (not administered)  Melatonin TABS 3 mg (not administered)    hydrocortisone (ANUSOL-HC) suppository 25 mg (not administered)  MUSCLE RUB CREA (not administered)  citalopram (CELEXA) tablet 20 mg (not administered)  diltiazem (CARDIZEM CD) 24 hr capsule 240 mg (not administered)  ipratropium-albuterol (DUONEB) 0.5-2.5 (3) MG/3ML nebulizer solution 3 mL (not administered)  montelukast (SINGULAIR) tablet 10 mg (not administered)  insulin aspart protamine- aspart (NOVOLOG MIX 70/30) injection 7 Units (not administered)  pantoprazole (PROTONIX) EC tablet 40 mg (not administered)  albuterol (PROVENTIL) (2.5 MG/3ML) 0.083% nebulizer solution 2.5 mg (not administered)  mometasone-formoterol (DULERA) 200-5 MCG/ACT inhaler 2 puff (not administered)  fesoterodine (TOVIAZ) tablet 8 mg (not administered)  traMADol (ULTRAM) tablet 50 mg (not administered)  URELLE (URELLE/URISED) 81 MG tablet 81 mg (not administered)  insulin aspart (novoLOG) injection 0-9 Units (not administered)  insulin aspart (novoLOG) injection 0-5 Units (not administered)  fentaNYL (SUBLIMAZE) injection 50 mcg (50 mcg Intravenous Given 04/04/16 1918)     Initial Impression / Assessment and Plan / ED Course  I have reviewed the triage vital signs and the nursing notes.  Pertinent labs & imaging results that were available during my care of the patient were reviewed by me and considered in my medical decision making (see chart for details).   patient here for evaluation of low back pain, generalized weakness, fall yesterday with right-sided back pain. Patient uncomfortable on moving but no reproducible pain on palpation. UA is concerning for UTI,  CBC with leukocytosis. Chest x-ray with pulmonary vascular congestion. Patient does have slight increase in her oxygen requirement compared to baseline. Providing IV antibiotics and plan to admit given her profound weakness and inability to ambulate at her baseline. Pt is a patient of [facility]. Internal medicine teaching service consulted for  admission.  Final Clinical Impressions(s) / ED Diagnoses   Final diagnoses:  None    New Prescriptions Current Discharge Medication List       Tilden Fossa, MD 04/05/16 [phone]

## 2016-04-04 NOTE — Progress Notes (Signed)
Pt arrived floor.  Vital signs completed. Pt settled in the room, and admission completed. MD notified of pt's arrival. Will continue to monitor.

## 2016-04-04 NOTE — ED Notes (Signed)
Unsuccessful blood draw attempt x2. 

## 2016-04-04 NOTE — ED Notes (Signed)
ED Provider at bedside. 

## 2016-04-04 NOTE — ED Triage Notes (Signed)
Per [facility], states she fell yesterday-[facility] to [facility] for lower back pain radiating to left hip-states they sent her here for x-rays sue to patient not being able to stand/bear weight

## 2016-04-05 ENCOUNTER — Encounter (HOSPITAL_COMMUNITY): Payer: Self-pay

## 2016-04-05 DIAGNOSIS — I509 Heart failure, unspecified: Secondary | ICD-10-CM | POA: Diagnosis present

## 2016-04-05 DIAGNOSIS — R251 Tremor, unspecified: Secondary | ICD-10-CM

## 2016-04-05 DIAGNOSIS — I129 Hypertensive chronic kidney disease with stage 1 through stage 4 chronic kidney disease, or unspecified chronic kidney disease: Secondary | ICD-10-CM

## 2016-04-05 DIAGNOSIS — N390 Urinary tract infection, site not specified: Secondary | ICD-10-CM | POA: Diagnosis present

## 2016-04-05 DIAGNOSIS — N179 Acute kidney failure, unspecified: Secondary | ICD-10-CM

## 2016-04-05 DIAGNOSIS — M1990 Unspecified osteoarthritis, unspecified site: Secondary | ICD-10-CM | POA: Diagnosis present

## 2016-04-05 DIAGNOSIS — I482 Chronic atrial fibrillation: Secondary | ICD-10-CM | POA: Diagnosis present

## 2016-04-05 DIAGNOSIS — Z9181 History of falling: Secondary | ICD-10-CM

## 2016-04-05 DIAGNOSIS — Z794 Long term (current) use of insulin: Secondary | ICD-10-CM

## 2016-04-05 DIAGNOSIS — M5136 Other intervertebral disc degeneration, lumbar region: Secondary | ICD-10-CM | POA: Diagnosis not present

## 2016-04-05 DIAGNOSIS — R0781 Pleurodynia: Secondary | ICD-10-CM | POA: Diagnosis not present

## 2016-04-05 DIAGNOSIS — J449 Chronic obstructive pulmonary disease, unspecified: Secondary | ICD-10-CM

## 2016-04-05 DIAGNOSIS — E1140 Type 2 diabetes mellitus with diabetic neuropathy, unspecified: Secondary | ICD-10-CM | POA: Diagnosis present

## 2016-04-05 DIAGNOSIS — Z9989 Dependence on other enabling machines and devices: Secondary | ICD-10-CM

## 2016-04-05 DIAGNOSIS — Z882 Allergy status to sulfonamides status: Secondary | ICD-10-CM | POA: Diagnosis not present

## 2016-04-05 DIAGNOSIS — I4891 Unspecified atrial fibrillation: Secondary | ICD-10-CM

## 2016-04-05 DIAGNOSIS — Z888 Allergy status to other drugs, medicaments and biological substances status: Secondary | ICD-10-CM | POA: Diagnosis not present

## 2016-04-05 DIAGNOSIS — I130 Hypertensive heart and chronic kidney disease with heart failure and stage 1 through stage 4 chronic kidney disease, or unspecified chronic kidney disease: Secondary | ICD-10-CM | POA: Diagnosis present

## 2016-04-05 DIAGNOSIS — G4733 Obstructive sleep apnea (adult) (pediatric): Secondary | ICD-10-CM | POA: Diagnosis present

## 2016-04-05 DIAGNOSIS — W19XXXA Unspecified fall, initial encounter: Secondary | ICD-10-CM

## 2016-04-05 DIAGNOSIS — R296 Repeated falls: Secondary | ICD-10-CM

## 2016-04-05 DIAGNOSIS — G253 Myoclonus: Secondary | ICD-10-CM | POA: Diagnosis present

## 2016-04-05 DIAGNOSIS — Z7901 Long term (current) use of anticoagulants: Secondary | ICD-10-CM | POA: Diagnosis not present

## 2016-04-05 DIAGNOSIS — H9190 Unspecified hearing loss, unspecified ear: Secondary | ICD-10-CM | POA: Diagnosis present

## 2016-04-05 DIAGNOSIS — B964 Proteus (mirabilis) (morganii) as the cause of diseases classified elsewhere: Secondary | ICD-10-CM | POA: Diagnosis not present

## 2016-04-05 DIAGNOSIS — M545 Low back pain: Secondary | ICD-10-CM | POA: Diagnosis present

## 2016-04-05 DIAGNOSIS — E1122 Type 2 diabetes mellitus with diabetic chronic kidney disease: Secondary | ICD-10-CM

## 2016-04-05 DIAGNOSIS — Z809 Family history of malignant neoplasm, unspecified: Secondary | ICD-10-CM | POA: Diagnosis not present

## 2016-04-05 DIAGNOSIS — Z89412 Acquired absence of left great toe: Secondary | ICD-10-CM | POA: Diagnosis not present

## 2016-04-05 DIAGNOSIS — N183 Chronic kidney disease, stage 3 (moderate): Secondary | ICD-10-CM | POA: Diagnosis present

## 2016-04-05 DIAGNOSIS — Z6841 Body Mass Index (BMI) 40.0 and over, adult: Secondary | ICD-10-CM | POA: Diagnosis not present

## 2016-04-05 DIAGNOSIS — M109 Gout, unspecified: Secondary | ICD-10-CM | POA: Diagnosis present

## 2016-04-05 DIAGNOSIS — Z825 Family history of asthma and other chronic lower respiratory diseases: Secondary | ICD-10-CM | POA: Diagnosis not present

## 2016-04-05 DIAGNOSIS — R0600 Dyspnea, unspecified: Secondary | ICD-10-CM | POA: Diagnosis not present

## 2016-04-05 DIAGNOSIS — K219 Gastro-esophageal reflux disease without esophagitis: Secondary | ICD-10-CM | POA: Diagnosis present

## 2016-04-05 DIAGNOSIS — F329 Major depressive disorder, single episode, unspecified: Secondary | ICD-10-CM

## 2016-04-05 DIAGNOSIS — Z8249 Family history of ischemic heart disease and other diseases of the circulatory system: Secondary | ICD-10-CM | POA: Diagnosis not present

## 2016-04-05 LAB — CBC
HCT: 29 % — ABNORMAL LOW (ref 36.0–46.0)
Hemoglobin: 9.1 g/dL — ABNORMAL LOW (ref 12.0–15.0)
MCH: 28.4 pg (ref 26.0–34.0)
MCHC: 31.4 g/dL (ref 30.0–36.0)
MCV: 90.6 fL (ref 78.0–100.0)
PLATELETS: 151 10*3/uL (ref 150–400)
RBC: 3.2 MIL/uL — AB (ref 3.87–5.11)
RDW: 16.7 % — AB (ref 11.5–15.5)
WBC: 10.3 10*3/uL (ref 4.0–10.5)

## 2016-04-05 LAB — BASIC METABOLIC PANEL
ANION GAP: 7 (ref 5–15)
BUN: 29 mg/dL — ABNORMAL HIGH (ref 6–20)
CALCIUM: 8.7 mg/dL — AB (ref 8.9–10.3)
CO2: 28 mmol/L (ref 22–32)
CREATININE: 1.35 mg/dL — AB (ref 0.44–1.00)
Chloride: 102 mmol/L (ref 101–111)
GFR, EST AFRICAN AMERICAN: 44 mL/min — AB (ref >60)
GFR, EST NON AFRICAN AMERICAN: 38 mL/min — AB (ref >60)
Glucose, Bld: 171 mg/dL — ABNORMAL HIGH (ref 65–99)
Potassium: 4.2 mmol/L (ref 3.5–5.1)
SODIUM: 137 mmol/L (ref 135–145)

## 2016-04-05 LAB — TSH: TSH: 0.79 u[IU]/mL (ref 0.350–4.500)

## 2016-04-05 LAB — GLUCOSE, CAPILLARY
GLUCOSE-CAPILLARY: 167 mg/dL — AB (ref 65–99)
Glucose-Capillary: 170 mg/dL — ABNORMAL HIGH (ref 65–99)
Glucose-Capillary: 208 mg/dL — ABNORMAL HIGH (ref 65–99)
Glucose-Capillary: 180 mg/dL — ABNORMAL HIGH (ref 65–99)
Glucose-Capillary: 145 mg/dL — ABNORMAL HIGH (ref 65–99)

## 2016-04-05 LAB — TROPONIN I

## 2016-04-05 LAB — MAGNESIUM: Magnesium: 2.1 mg/dL (ref 1.7–2.4)

## 2016-04-05 MED ORDER — ALBUTEROL SULFATE (2.5 MG/3ML) 0.083% IN NEBU: 2.5000 mg | INHALATION_SOLUTION | RESPIRATORY_TRACT | Status: DC | PRN

## 2016-04-05 MED ORDER — MONTELUKAST SODIUM 10 MG PO TABS
10.0000 mg | ORAL_TABLET | Freq: Every day | ORAL | Status: DC
  Administered 2016-04-05 (×2): 10 mg via ORAL
  Filled 2016-04-05 (×2): qty 1

## 2016-04-05 MED ORDER — POLYETHYLENE GLYCOL 3350 17 G PO PACK: 17.0000 g | PACK | Freq: Every day | ORAL | Status: DC | PRN

## 2016-04-05 MED ORDER — PANTOPRAZOLE SODIUM 40 MG PO TBEC
40.0000 mg | DELAYED_RELEASE_TABLET | Freq: Every day | ORAL | Status: DC
  Administered 2016-04-05 – 2016-04-06 (×2): 40 mg via ORAL
  Filled 2016-04-05 (×2): qty 1

## 2016-04-05 MED ORDER — SODIUM CHLORIDE 0.9% FLUSH
3.0000 mL | Freq: Two times a day (BID) | INTRAVENOUS | Status: DC
  Administered 2016-04-05 – 2016-04-06 (×4): 3 mL via INTRAVENOUS

## 2016-04-05 MED ORDER — INSULIN ASPART 100 UNIT/ML ~~LOC~~ SOLN
0.0000 [IU] | Freq: Three times a day (TID) | SUBCUTANEOUS | Status: DC
  Administered 2016-04-05: 2 [IU] via SUBCUTANEOUS
  Administered 2016-04-05: 3 [IU] via SUBCUTANEOUS
  Administered 2016-04-05 – 2016-04-06 (×2): 2 [IU] via SUBCUTANEOUS
  Administered 2016-04-06: 3 [IU] via SUBCUTANEOUS
  Administered 2016-04-06: 2 [IU] via SUBCUTANEOUS

## 2016-04-05 MED ORDER — IPRATROPIUM-ALBUTEROL 0.5-2.5 (3) MG/3ML IN SOLN
3.0000 mL | Freq: Four times a day (QID) | RESPIRATORY_TRACT | Status: DC
Start: 2016-04-05 — End: 2016-04-06
  Administered 2016-04-05 (×3): 3 mL via RESPIRATORY_TRACT
  Filled 2016-04-05 (×5): qty 3

## 2016-04-05 MED ORDER — LORATADINE 10 MG PO TABS
10.0000 mg | ORAL_TABLET | Freq: Every morning | ORAL | Status: DC
  Administered 2016-04-05 – 2016-04-06 (×2): 10 mg via ORAL
  Filled 2016-04-05 (×2): qty 1

## 2016-04-05 MED ORDER — INSULIN ASPART 100 UNIT/ML ~~LOC~~ SOLN: 0.0000 [IU] | Freq: Every day | SUBCUTANEOUS | Status: DC

## 2016-04-05 MED ORDER — ATORVASTATIN CALCIUM 10 MG PO TABS
10.0000 mg | ORAL_TABLET | Freq: Every day | ORAL | Status: DC
  Administered 2016-04-05 – 2016-04-06 (×2): 10 mg via ORAL
  Filled 2016-04-05 (×2): qty 1

## 2016-04-05 MED ORDER — FESOTERODINE FUMARATE ER 8 MG PO TB24
8.0000 mg | ORAL_TABLET | Freq: Every day | ORAL | Status: DC
  Administered 2016-04-05 – 2016-04-06 (×2): 8 mg via ORAL
  Filled 2016-04-05 (×2): qty 1

## 2016-04-05 MED ORDER — APIXABAN 5 MG PO TABS
5.0000 mg | ORAL_TABLET | Freq: Two times a day (BID) | ORAL | Status: DC
  Administered 2016-04-05 – 2016-04-06 (×4): 5 mg via ORAL
  Filled 2016-04-05 (×4): qty 1

## 2016-04-05 MED ORDER — HYDROCORTISONE ACETATE 25 MG RE SUPP: 25.0000 mg | Freq: Two times a day (BID) | RECTAL | Status: DC | PRN

## 2016-04-05 MED ORDER — DILTIAZEM HCL ER COATED BEADS 240 MG PO CP24
240.0000 mg | ORAL_CAPSULE | Freq: Every day | ORAL | Status: DC
  Administered 2016-04-05 – 2016-04-06 (×2): 240 mg via ORAL
  Filled 2016-04-05 (×2): qty 1

## 2016-04-05 MED ORDER — TRAMADOL HCL 50 MG PO TABS
50.0000 mg | ORAL_TABLET | Freq: Four times a day (QID) | ORAL | Status: DC | PRN
  Administered 2016-04-05 – 2016-04-06 (×6): 50 mg via ORAL
  Filled 2016-04-05 (×6): qty 1

## 2016-04-05 MED ORDER — PANTOPRAZOLE SODIUM 20 MG PO TBEC
20.0000 mg | DELAYED_RELEASE_TABLET | Freq: Once | ORAL | Status: AC
  Administered 2016-04-05: 20 mg via ORAL
  Filled 2016-04-05: qty 1

## 2016-04-05 MED ORDER — URELLE 81 MG PO TABS: 81.0000 mg | ORAL_TABLET | Freq: Four times a day (QID) | ORAL | Status: DC | PRN

## 2016-04-05 MED ORDER — INSULIN ASPART PROT & ASPART (70-30 MIX) 100 UNIT/ML ~~LOC~~ SUSP
7.0000 [IU] | Freq: Two times a day (BID) | SUBCUTANEOUS | Status: DC
  Administered 2016-04-05 (×2): 7 [IU] via SUBCUTANEOUS
  Filled 2016-04-05: qty 10

## 2016-04-05 MED ORDER — MELATONIN 3 MG PO TABS
3.0000 mg | ORAL_TABLET | Freq: Every day | ORAL | Status: DC
  Administered 2016-04-05 (×2): 3 mg via ORAL
  Filled 2016-04-05 (×3): qty 1

## 2016-04-05 MED ORDER — ACETAMINOPHEN 650 MG RE SUPP: 650.0000 mg | Freq: Four times a day (QID) | RECTAL | Status: DC | PRN

## 2016-04-05 MED ORDER — MUSCLE RUB 10-15 % EX CREA: TOPICAL_CREAM | CUTANEOUS | Status: DC | PRN

## 2016-04-05 MED ORDER — ACETAMINOPHEN 325 MG PO TABS
650.0000 mg | ORAL_TABLET | Freq: Four times a day (QID) | ORAL | Status: DC | PRN
  Administered 2016-04-05: 650 mg via ORAL
  Filled 2016-04-05 (×2): qty 2

## 2016-04-05 MED ORDER — MOMETASONE FURO-FORMOTEROL FUM 200-5 MCG/ACT IN AERO
2.0000 | INHALATION_SPRAY | Freq: Two times a day (BID) | RESPIRATORY_TRACT | Status: DC
Start: 2016-04-05 — End: 2016-04-06
  Administered 2016-04-05: 2 via RESPIRATORY_TRACT
  Filled 2016-04-05: qty 8.8

## 2016-04-05 MED ORDER — CITALOPRAM HYDROBROMIDE 20 MG PO TABS
20.0000 mg | ORAL_TABLET | Freq: Every day | ORAL | Status: DC
  Administered 2016-04-05 (×2): 20 mg via ORAL
  Filled 2016-04-05 (×2): qty 1

## 2016-04-05 NOTE — Progress Notes (Signed)
Pt complained of chest pain. MD notified. Orders for one time Protonix and EKG were placed. EKG completed. Protonix given. Will continue to monitor.

## 2016-04-05 NOTE — H&P (Signed)
Date: 04/05/2016               Patient Name:  Elaine Lee MRN: [id number]  DOB: 1945-07-01 Age / Sex: 71 y.o., female   PCP: Jethro Bastos, MD         Medical Service: Internal Medicine Teaching Service         Attending Physician: Dr. Tyson Alias, MD    First Contact: Dr. Althia Forts Pager: [phone]  Second Contact: Dr. Deneise Lever Pager: [phone]       After Hours (After 5p/  First Contact Pager: [phone]  weekends / holidays): Second Contact Pager: [phone]   Chief Complaint: Back pain, right knee pain and pleuritic chest pain  History of Present Illness: The patient is a 71 year old female [facility] patient with multiple comorbidities including A. fib on Eliquis, CKD stage III, COPD, degenerative lumbar disease, diabetes mellitus complicated by diabetic neuropathy, gastroesophageal reflux disease, hypertension and obstructive sleep apnea on CPAP who presents via referral from her primary care provider with multiple concerns including a one-week history of bilateral low back pain with associated jerking sensation in all 4 extremities and a recent fall yesterday. The patient states that yesterday evening she had shaking and jerking in her legs causing her to fall on her right knee. She did not hit her head or lose consciousness. This fall was not preceded by dizziness or presyncope. She says her shakiness and jerking caused her to fall. After the fall she noticed pain in her right knee and her lower back bilaterally. She also says she has had the same bilateral low back pain for approximately one week. It is in the lumbar region. She describes this as an achy pain. There is no radiation down her legs. There are no red flag symptoms associated with this pain. She also endorses right upper back pain and chest pain that is worse with deep inspiration but denies shortness of breath or cough. This has also been for one week in duration. She denies nausea or vomiting. She denies  abdominal pain or diarrhea. She endorses chronic dysuria.  The patient was initially seen in the [facility] emergency department. Vitals upon arrival showed a blood pressure 148/97, pulse 84, respiratory rate 16 satting 92%. The patient was afebrile. Urinalysis was nitrite positive with many bacteria present. Comprehensive metabolic panel showed hyperglycemia with glucose 163, BUN 37, creatinine 1.63. CBC demonstrated leukocytosis with white blood cell count of 15.1 and anemia with hemoglobin of 9.2. I-STAT troponin negative. BNP 184.8. Two-view diagnostic chest radiograph showed worsening aeration, mild vascular congestion and enlargement of her cardiac silhouette. Plain film of the lumbar spine showed no acute osseous abnormality. Complete four view radiograph of the right knee was negative for acute osseous abnormality. Blood cultures were obtained and the patient was transferred to the [facility] for further workup and management.  Meds:  Current Meds  Medication Sig  . acetaminophen (TYLENOL) 325 MG tablet Take 650 mg by mouth every 8 (eight) hours.   Marland Kitchen apixaban (ELIQUIS) 5 MG TABS tablet Take 5 mg by mouth 2 (two) times daily.  Marland Kitchen atorvastatin (LIPITOR) 10 MG tablet Take 10 mg by mouth daily.  . budesonide-formoterol (SYMBICORT) 160-4.5 MCG/ACT inhaler Inhale 2 puffs into the lungs 2 (two) times daily.  Marland Kitchen diltiazem (CARDIZEM CD) 240 MG 24 hr capsule Take 240 mg by mouth daily.  Marland Kitchen escitalopram (LEXAPRO) 20 MG tablet Take 20 mg by mouth at bedtime.  . fesoterodine (  TOVIAZ) 8 MG TB24 tablet Take 8 mg by mouth daily.  Marland Kitchen FLUoxetine (PROZAC) 40 MG capsule Take 40 mg by mouth 2 (two) times daily. Reported on 01/25/2015  . furosemide (LASIX) 20 MG tablet Take 20 mg by mouth daily.  . furosemide (LASIX) 40 MG tablet Take 40 mg by mouth 2 (two) times daily.  Marland Kitchen gabapentin (NEURONTIN) 600 MG tablet Take 1 tablet (600 mg total) by mouth 2 (two) times daily. (Patient taking differently: Take  600-1,200 mg by mouth 2 (two) times daily. Take 1 tablet in the am and Take 2 tablets in the evening.)  . guaiFENesin (MUCINEX) 600 MG 12 hr tablet Take by mouth 2 (two) times daily as needed for cough.  . hydrocortisone (ANUSOL-HC) 25 MG suppository Place 25 mg rectally 2 (two) times daily as needed for hemorrhoids or itching.  . insulin aspart protamine- aspart (NOVOLOG MIX 70/30) (70-30) 100 UNIT/ML injection Inject 14-25 Units into the skin 2 (two) times daily with a meal. Takes 25 units in am and 14 units in pm  . ipratropium-albuterol (DUONEB) 0.5-2.5 (3) MG/3ML SOLN Take 3 mLs by nebulization 4 (four) times daily.  Marland Kitchen liraglutide (VICTOZA) 18 MG/3ML SOPN Inject 1.8 mg into the skin daily.  Marland Kitchen loratadine (CLARITIN) 10 MG tablet Take 10 mg by mouth every morning.  Marland Kitchen losartan (COZAAR) 100 MG tablet Take 100 mg by mouth daily.  . Melatonin 5 MG TABS Take 1 tablet by mouth at bedtime.  . Meth-Hyo-M Bl-Na Phos-Ph Sal (URIBEL) 118 MG CAPS Take 1 capsule by mouth 4 (four) times daily as needed (bladder spasms).  . montelukast (SINGULAIR) 10 MG tablet Take 1 tablet (10 mg total) by mouth at bedtime.  Marland Kitchen nystatin (NYSTATIN) powder Apply 1 Bottle topically 4 (four) times daily as needed (skin folds).  Marland Kitchen omeprazole (PRILOSEC) 20 MG capsule Take 20 mg by mouth daily.  . OXYGEN Inhale 2-3 L into the lungs daily as needed (bedtime).   . potassium chloride (K-DUR,KLOR-CON) 10 MEQ tablet Take 10 mEq by mouth daily.  . simethicone (MYLICON) 125 MG chewable tablet Chew 125 mg by mouth every 6 (six) hours as needed for flatulence.  . traMADol (ULTRAM) 50 MG tablet Take 50 mg by mouth every 6 (six) hours as needed for moderate pain or severe pain.     Allergies: Allergies as of 04/04/2016 - Review Complete 04/04/2016  Allergen Reaction Noted  . Other Shortness Of Breath 06/13/2014  . Tape Itching and Rash 06/13/2014  . Celebrex [celecoxib] Other (See Comments) 06/13/2014  . Latex Hives, Itching, and Rash  06/13/2014  . Mysoline [primidone] Other (See Comments) 06/13/2014  . Niacin and related Hives and Swelling 06/13/2014  . Sulfa antibiotics Other (See Comments) 06/13/2014  . Aspirin Other (See Comments) 06/13/2014   Past Medical History:  Diagnosis Date  . A-fib (HCC)   . Asthma   . CHF (congestive heart failure) (HCC)   . Chronic edema   . CKD (chronic kidney disease), stage III   . COPD (chronic obstructive pulmonary disease) (HCC)   . Degenerative lumbar disc   . Depression   . Diabetes mellitus without complication (HCC)   . Diabetic neuropathy (HCC)   . GERD (gastroesophageal reflux disease)   . Gout   . Hearing loss   . Hypercholesteremia   . Hypertension   . Insomnia   . Obstructive sleep apnea on CPAP   . Osteoarthritis    knees and shoulders  . Renal disorder   . Urinary  frequency     Family History:  Family History  Problem Relation Age of Onset  . Allergies Mother   . Rheumatologic disease Mother   . Cancer Mother   . Allergies Father   . Asthma Father   . Heart disease Father   . Rheumatologic disease Father   . Cancer Father   . Allergies Daughter   . Cancer Sister      Social History:  Social History   Social History  . Marital status: Married    Spouse name: N/A  . Number of children: N/A  . Years of education: N/A   Occupational History  . Not on file.   Social History Main Topics  . Smoking status: Never Smoker  . Smokeless tobacco: Not on file  . Alcohol use No  . Drug use: Unknown  . Sexual activity: Not on file   Other Topics Concern  . Not on file   Social History Narrative  . No narrative on file     Review of Systems: A complete ROS was negative except as per HPI.   Physical Exam: Blood pressure (!) 117/54, pulse 75, temperature 98.3 F (36.8 C), temperature source Oral, resp. rate 16, height  (1.651 m), weight 125.4 kg (276 lb 8 oz), SpO2 95 %. Physical Exam  Constitutional: She is oriented to person,  place, and time. She appears well-developed and well-nourished.  Obese, very pleasant female  HENT:  Head: Normocephalic and atraumatic.  Erythematous patch on the patient's chin  Eyes: Pupils are equal, round, and reactive to light.  Cardiovascular: Normal rate and regular rhythm.   Distant heart sounds, irregular rhythm  Respiratory: Effort normal. She has wheezes.  Bilateral expiratory wheeze  GI: Soft. Bowel sounds are normal. She exhibits no distension. There is no tenderness.  Musculoskeletal: She exhibits edema and tenderness.  Bilateral pretibial edema. Right knee moderate erythematous with mild tenderness to palpation.  Neurological: She is alert and oriented to person, place, and time. No cranial nerve deficit.  Cranial nerves grossly intact, strength 5 out of 5 in the upper and lower extremities bilaterally. Finger-nose-finger normal. Negative pronator drift.     EKG: Atrial fibrillation  CXR: Worsening aeration with mild vascular congestion and marked enlargement of cardiac silhouette from prior  Assessment & Plan by Problem: Active Problems:   Acute UTI The patient is a 71 year old female with multiple comorbidities including A. fib on anticoagulation, CKD stage III, COPD, hypertension, diabetes mellitus and obstructive sleep apnea who presents with multiple complaints and a recent fall.  # Pleuritic Chest Pain Patient only briefly mentioned pleuritic chest pain during examination. However, this was an issue at her appointment when seen by her primary care physician. She does state that when she takes deep breaths she has back pain and chest pain. She does not have chest pain at rest. Troponins in the emergency department negative. EKG showed A. fib and nonspecific T-wave changes.Chest radiograph demonstrated an enlarged cardiac silhouette from prior radiographs. Unclear etiology. -- Trend troponins -- Repeat echocardiogram  # Low Back Pain ## Recurrent Falls in an  elderly patient The patient presents with a one-week history of low back pain which acutely worsened after a fall. It appears the patient had an episode of shakiness in her legs causing her to fall. She landed on her right knee. She did not hit her head or lose consciousness. There is minimal tenderness to palpation over the right knee. Diagnostic radiographs of the right knee and lumbar  spine did not reveal any acute osseous abnormality or fracture. She says she has had approximately 5 falls over the previous 6 months. They all are from different mechanisms and seem to be related to mechanical issues. Neurological examination was grossly intact. I think she will benefit from pain control and physical therapy. -- Tramadol 50 mg every 6 hours when necessary pain -- Consult PT/OT tomorrow  # Myoclonus  Per referring PCP patient demonstrated myoclonus at prior office visit. I question whether this is the shakiness the patient describes. Not appreciated on my examination today. Neurological exam grossly normal. Differential diagnosis is broad and includes medication affects, infection or other neurological process. We'll hold gabapentin and treat the underlying urinary tract infection. -- Hold gabapentin -- Treat UTI -- Continue to monitor  # UTI The patient complains of severe dysuria. Urinalysis consistent with infection. Given ceftriaxone for UTI. She follows with urology for multiple recurrent urinary tract infections. Leukocytosis with a white blood cell count of 15.1. Follow-up urine culture and continue his antibiotics. -- Follow-up urine culture -- Continue ceftriaxone  # AoC Kidney Disease Patient's creatinine of 1.63 on admission. Baseline ranges from 1.2-1.4. I suspect this is secondary to decreased by mouth intake in the setting of urinary tract infection. Will hold ARB  and furosemide and encourage by mouth intake. -- Hold furosemide -- Hold ARB inhibitor -- Continue to monitor  # A.  Fib ## HTN -- Apixaban  BID -- Diltiazem 240 mg extended release once daily -- Atorvastatin 10 mg once daily  # Diabetes mellitus -- Sliding scale insulin  # COPD -- Albuterol nebulizer every 2 hours when necessary wheezing and shortness of breath -- Ipratropium albuterol duo neb 4 times daily -- Singulair  once daily  # Depression  -- Citalopram 20 mg once daily  DVT/PE prophylaxis: Therapeutically anticoagulated FEN/GI: Carb modified diet Code: Full code  Dispo: Admit patient to Inpatient with expected length of stay greater than 2 midnights.  Signed: Thomasene Lot, MD 04/05/2016, 12:19 AM  Pager: [phone]

## 2016-04-05 NOTE — Evaluation (Signed)
Physical Therapy Evaluation Patient Details Name: Elaine Lee MRN: [id number] DOB: 17-Jun-1945 Today's Date: 04/05/2016   History of Present Illness  Pt is a 71 y/o female admitted following a fall with knee/back pain.  PMH significant for Afib, CKD III, COPD, DM, HTN. Films (-) for fracture in knee and back  Clinical Impression  Pt tolerated well.  Internally distracted throughout session but able to be redirected to task with mod cues.  Pt performing overall at min assist level but requiring significantly increased time to complete all tasks 2/2 pain in back and R knee.  Discussed d/c planning with pt and pt prefers short term rehab at SNF to maximize independence and decrease caregiver burden before returning home.      Follow Up Recommendations SNF;Supervision/Assistance - 24 hour    Equipment Recommendations  None recommended by PT    Recommendations for Other Services Speech consult     Precautions / Restrictions Precautions Precautions: Fall Precaution Comments: watch O2 Restrictions Weight Bearing Restrictions: No      Mobility  Bed Mobility Overal bed mobility: Needs Assistance Bed Mobility: Sidelying to Sit;Rolling Rolling: Supervision Sidelying to sit: Supervision (uses bed rails)       General bed mobility comments: moves slowly 2/2 pain  Transfers Overall transfer level: Needs assistance Equipment used: Rolling walker (2 wheeled) (bed elevated) Transfers: Sit to/from Stand Sit to Stand: Min guard;From elevated surface         General transfer comment: takes increased time to come to standing due to internal distractions  Ambulation/Gait Ambulation/Gait assistance: Min guard Ambulation Distance (Feet): 10 Feet Assistive device: Rolling walker (2 wheeled) Gait Pattern/deviations: Step-through pattern;Trunk flexed;Wide base of support Gait velocity: slow 2/2 pain      Stairs            Wheelchair Mobility    Modified Rankin (Stroke  Patients Only)       Balance Overall balance assessment: Needs assistance         Standing balance support: Bilateral upper extremity supported Standing balance-Leahy Scale: Fair                               Pertinent Vitals/Pain Pain Assessment: Faces Faces Pain Scale: Hurts even more Pain Location: back Pain Intervention(s): Limited activity within patient's tolerance;Monitored during session;Repositioned    Home Living Family/patient expects to be discharged to:: Private residence Living Arrangements: Spouse/significant other Available Help at Discharge: Available 24 hours/day Type of Home: House Home Access: Ramped entrance     Home Layout: One level Home Equipment: Environmental consultant - 2 wheels;Walker - 4 wheels;Grab bars - toilet;Grab bars - tub/shower;Hospital bed;Wheelchair - manual;Bedside commode Additional Comments: pt states she chooses her ambulatory aid based on her daily needs    Prior Function Level of Independence: Independent with assistive device(s)               Hand Dominance        Extremity/Trunk Assessment   Upper Extremity Assessment Upper Extremity Assessment: Defer to OT evaluation    Lower Extremity Assessment Lower Extremity Assessment: Generalized weakness (did not formally assess 2/2 back pain)       Communication      Cognition Arousal/Alertness: Awake/alert Behavior During Therapy: WFL for tasks assessed/performed Overall Cognitive Status: Difficult to assess  General Comments      Exercises     Assessment/Plan    PT Assessment Patient needs continued PT services  PT Problem List Decreased activity tolerance;Decreased mobility;Decreased knowledge of use of DME;Cardiopulmonary status limiting activity;Obesity       PT Treatment Interventions Gait training;Functional mobility training;Therapeutic exercise;Patient/family education    PT Goals (Current  goals can be found in the Care Plan section)  Acute Rehab PT Goals Patient Stated Goal: to get stronger PT Goal Formulation: With patient Time For Goal Achievement: 04/12/16 Potential to Achieve Goals: Good    Frequency Min 2X/week   Barriers to discharge Decreased caregiver support      Co-evaluation               End of Session   Activity Tolerance: Patient limited by fatigue Patient left: in chair;with call bell/phone within reach Nurse Communication: Mobility status PT Visit Diagnosis: Unsteadiness on feet (R26.81);Muscle weakness (generalized) (M62.81);Repeated falls (R29.6)    Time: 1455-1535 PT Time Calculation (min) (ACUTE ONLY): 40 min   Charges:   PT Evaluation $PT Eval Moderate Complexity: 1 Procedure PT Treatments $Therapeutic Activity: 23-37 mins   PT G Codes:        Teodoro Kil, PT, DPT   Tyniya Kuyper E Penven-Crew 04/05/2016, 4:40 PM

## 2016-04-05 NOTE — Progress Notes (Signed)
Subjective: ' No acute events overnight. Feels better this morning and pain improved. Continues to have some dysuria and malodorous urine. Interested in working with physical therapy today.  Discussed with Dr Mayford Knife at [facility] and updated her. Pt has chronic incontinence. She says that pt is only on one SSRI. Says pt will benefit from going to SNF- Per her pt has been to [facility] and [facility] social work will begin the process with the CSW here.   Objective: Vital signs in last 24 hours: Vitals:   04/04/16 2305 04/05/16 0637 04/05/16 0902 04/05/16 1024  BP: (!) 117/54 97/76  (!) 114/53  Pulse: 75 72    Resp: 16 16    Temp: 98.3 F (36.8 C) 98.3 F (36.8 C)    TempSrc: Oral Oral    SpO2: 95% 90% 94%   Weight: 276 lb 8 oz (125.4 kg)     Height:  (1.651 m)       Intake/Output Summary (Last 24 hours) at 04/05/16 1029 Last data filed at 04/04/16 2222  Gross per 24 hour  Intake               50 ml  Output                0 ml  Net               50 ml    Physical Exam General appearance: resting in bed comfortably. Chronically ill.  HENT: Normocephalic, atraumatic Cardiovascular: distant heart sounds , no murmurs, rubs, gallops Respiratory/Chest: Clear to ausculation bilaterally, normal work of breathing Abdomen: Bowel sounds present, soft, non-tender, non-distended Extremities: no clonus appreciated. 1+ pitting edema b/l. Mild hand tremor. No asterixis.   Labs / Imaging / Procedures: CBC Latest Ref Rng & Units 04/05/2016 04/04/2016 06/13/2014  WBC 4.0 - 10.5 K/uL 10.3 15.1(H) 9.4  Hemoglobin 12.0 - 15.0 g/dL 1.6(X) 0.9(U) 10.3(L)  Hematocrit 36.0 - 46.0 % 29.0(L) 29.7(L) 33.0(L)  Platelets 150 - 400 K/uL 151 172 253   BMP Latest Ref Rng & Units 04/05/2016 04/04/2016 06/16/2014  Glucose 65 - 99 mg/dL 045(W) 098(J) 191(Y)  BUN 6 - 20 mg/dL 78(G) 95(A) 21(H)  Creatinine 0.44 - 1.00 mg/dL 0.86(V) 7.84(O) 9.62(X)  Sodium 135 - 145 mmol/L 137 139 138  Potassium 3.5 - 5.1 mmol/L 4.2  4.3 4.5  Chloride 101 - 111 mmol/L 102 103 99(L)  CO2 22 - 32 mmol/L Calcium 8.9 - 10.3 mg/dL 5.2(W) 4.1(L) 9.6   Dg Chest 2 View  Result Date: 04/04/2016 CLINICAL DATA:  Patient fell. Cough. History of asthma. Morbid obesity. EXAM: CHEST  2 VIEW COMPARISON:  06/03/2015. FINDINGS: AP semierect radiograph demonstrates marked enlargement of the cardiac silhouette, increased from priors. Mild vascular congestion without visible pneumothorax, overt failure, or consolidation. Increased body habitus. IMPRESSION: Worsening aeration. Mild vascular congestion. Marked enlargement of cardiac silhouette, increased from priors. Electronically Signed   By: Elsie Stain M.D.   On: 04/04/2016 17:59   Dg Lumbar Spine Complete  Result Date: 04/04/2016 CLINICAL DATA:  71 year old who fell yesterday when her knees gave out. Patient complains of low back pain and right knee pain. Initial encounter. EXAM: LUMBAR SPINE - COMPLETE 4+ VIEW COMPARISON:  04/07/2015 and bone window images from CT abdomen and pelvis 10/15/2014, 08/13/2009. FINDINGS: Five non-rib-bearing lumbar vertebrae. Grade 2 spondylolisthesis of L4 on L5 measuring approximately 1.5 cm, likely degenerative in nature, as there are no visible pars defects. Degenerative disc  disease with vacuum disc phenomenon at L4-5. Moderate disc space narrowing at L1-2 and mild disc space narrowing at L2-3. Facet degenerative changes from L3-4 through L5-S1. Osseous demineralization. No fractures. Visualized sacroiliac joints intact. Aortoiliac atherosclerosis without evidence of aneurysm. IMPRESSION: 1. No acute osseous abnormality. 2. Degenerative grade 2 spondylolisthesis of L4 on L5 measuring approximately 1.5 cm. 3. Degenerative disc disease at L4-5, L1-2 and L2-3. 4. Facet degenerative changes from L3-4 through L5-S1. 5. Aortoiliac atherosclerosis. Electronically Signed   By: Hulan Saas M.D.   On: 04/04/2016 18:02   Dg Knee Complete 4 Views Right  Result  Date: 04/04/2016 CLINICAL DATA:  71 year old who fell yesterday when her knees gave out. Patient complains of low back pain and right knee pain. Initial encounter. EXAM: RIGHT KNEE - COMPLETE 4+ VIEW COMPARISON:  09/18/2014. FINDINGS: No evidence of acute fracture or dislocation. Tricompartment joint space narrowing, severe in the lateral compartment and moderate in the medial and patellofemoral compartments, unchanged. Generalized osseous demineralization. No visible effusion. IMPRESSION: 1. No acute osseous abnormality. 2. 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 3. Osseous demineralization. Electronically Signed   By: Hulan Saas M.D.   On: 04/04/2016 18:05    Assessment/Plan: 71 yo woman with morbid obesity, sleep apnea, COPD who follows at [facility] admitted for dehydration and UTI, and some tremors and fall.   Active Problems:   Acute UTI   Fall  Possible UTI with history of chronic urinary incontinence:  On UA has + nitrites but no leukocytes and some bacteria. Urine cultures pending. Patient has chronic urinary incontinence and follows with outside urologist.   -continue ceftriaxone -continue festerodine -await urine culture  AKI: Cr is back to baseline (1.3) from 1.6 on admission.   -continue to hold lasix and losartan. Resume on discharge.    Fall: likely due to underlying UTi and dehydration.  She is only on one SSRI and tramadol.  - consulted PT. -likely will need SNF- Contacted [facility] wiho will coordinate with CSW here.   Tremors: Myoclonus resolved this morning. TSH normal. Pt is only on one SSRI per discussion with PCP. -continue current meds.    Pleuritic chest pain: resolved. Trop neg.  Elevated PA pressures on prior echo and enlarged cardiac silhoutte on current cxr -pending echo     Dispo: Anticipated discharge in approximately 1-2 day(s).   LOS: 0 days   Deneise Lever, MD 04/05/2016, 10:29 AM

## 2016-04-06 ENCOUNTER — Inpatient Hospital Stay (HOSPITAL_COMMUNITY): Payer: Medicare (Managed Care)

## 2016-04-06 DIAGNOSIS — Z886 Allergy status to analgesic agent status: Secondary | ICD-10-CM

## 2016-04-06 DIAGNOSIS — Z8489 Family history of other specified conditions: Secondary | ICD-10-CM

## 2016-04-06 DIAGNOSIS — Z8249 Family history of ischemic heart disease and other diseases of the circulatory system: Secondary | ICD-10-CM

## 2016-04-06 DIAGNOSIS — Z882 Allergy status to sulfonamides status: Secondary | ICD-10-CM

## 2016-04-06 DIAGNOSIS — Z91048 Other nonmedicinal substance allergy status: Secondary | ICD-10-CM

## 2016-04-06 DIAGNOSIS — Z888 Allergy status to other drugs, medicaments and biological substances status: Secondary | ICD-10-CM

## 2016-04-06 DIAGNOSIS — B964 Proteus (mirabilis) (morganii) as the cause of diseases classified elsewhere: Secondary | ICD-10-CM

## 2016-04-06 DIAGNOSIS — Z809 Family history of malignant neoplasm, unspecified: Secondary | ICD-10-CM

## 2016-04-06 DIAGNOSIS — R0600 Dyspnea, unspecified: Secondary | ICD-10-CM

## 2016-04-06 DIAGNOSIS — Z825 Family history of asthma and other chronic lower respiratory diseases: Secondary | ICD-10-CM

## 2016-04-06 DIAGNOSIS — Z91040 Latex allergy status: Secondary | ICD-10-CM

## 2016-04-06 DIAGNOSIS — Z91018 Allergy to other foods: Secondary | ICD-10-CM

## 2016-04-06 DIAGNOSIS — Z8269 Family history of other diseases of the musculoskeletal system and connective tissue: Secondary | ICD-10-CM

## 2016-04-06 LAB — BASIC METABOLIC PANEL
ANION GAP: 8 (ref 5–15)
BUN: 17 mg/dL (ref 6–20)
CHLORIDE: 103 mmol/L (ref 101–111)
CO2: 29 mmol/L (ref 22–32)
Calcium: 8.8 mg/dL — ABNORMAL LOW (ref 8.9–10.3)
Creatinine, Ser: 0.89 mg/dL (ref 0.44–1.00)
Glucose, Bld: 169 mg/dL — ABNORMAL HIGH (ref 65–99)
POTASSIUM: 4.1 mmol/L (ref 3.5–5.1)
SODIUM: 140 mmol/L (ref 135–145)

## 2016-04-06 LAB — CBC
HCT: 29.3 % — ABNORMAL LOW (ref 36.0–46.0)
HEMOGLOBIN: 9 g/dL — AB (ref 12.0–15.0)
MCH: 27.6 pg (ref 26.0–34.0)
MCHC: 30.7 g/dL (ref 30.0–36.0)
MCV: 89.9 fL (ref 78.0–100.0)
Platelets: 167 10*3/uL (ref 150–400)
RBC: 3.26 MIL/uL — AB (ref 3.87–5.11)
RDW: 16.4 % — ABNORMAL HIGH (ref 11.5–15.5)
WBC: 6 10*3/uL (ref 4.0–10.5)

## 2016-04-06 LAB — GLUCOSE, CAPILLARY
GLUCOSE-CAPILLARY: 158 mg/dL — AB (ref 65–99)
GLUCOSE-CAPILLARY: 212 mg/dL — AB (ref 65–99)
Glucose-Capillary: 160 mg/dL — ABNORMAL HIGH (ref 65–99)

## 2016-04-06 LAB — FERRITIN: FERRITIN: 113 ng/mL (ref 11–307)

## 2016-04-06 LAB — ECHOCARDIOGRAM COMPLETE
Height: 65 in
WEIGHTICAEL: 4765.46 [oz_av]

## 2016-04-06 MED ORDER — GABAPENTIN 300 MG PO CAPS
600.0000 mg | ORAL_CAPSULE | Freq: Once | ORAL | Status: AC
  Administered 2016-04-06: 600 mg via ORAL
  Filled 2016-04-06: qty 2

## 2016-04-06 MED ORDER — INSULIN ASPART PROT & ASPART (70-30 MIX) 100 UNIT/ML ~~LOC~~ SUSP: 10.0000 [IU] | Freq: Two times a day (BID) | SUBCUTANEOUS | 11 refills | Status: DC

## 2016-04-06 MED ORDER — GABAPENTIN 600 MG PO TABS: 600.0000 mg | ORAL_TABLET | Freq: Two times a day (BID) | ORAL | Status: DC

## 2016-04-06 MED ORDER — CEPHALEXIN 500 MG PO CAPS: 500.0000 mg | ORAL_CAPSULE | Freq: Two times a day (BID) | ORAL | 0 refills | Status: DC

## 2016-04-06 MED ORDER — INSULIN ASPART PROT & ASPART (70-30 MIX) 100 UNIT/ML ~~LOC~~ SUSP
10.0000 [IU] | Freq: Two times a day (BID) | SUBCUTANEOUS | Status: DC
  Administered 2016-04-06 (×2): 10 [IU] via SUBCUTANEOUS

## 2016-04-06 MED ORDER — GABAPENTIN 400 MG PO CAPS: 1200.0000 mg | ORAL_CAPSULE | Freq: Every day | ORAL | Status: DC

## 2016-04-06 MED ORDER — ALUM & MAG HYDROXIDE-SIMETH 200-200-20 MG/5ML PO SUSP
15.0000 mL | Freq: Once | ORAL | Status: AC
  Administered 2016-04-06: 15 mL via ORAL
  Filled 2016-04-06: qty 30

## 2016-04-06 MED ORDER — ACETAMINOPHEN 325 MG PO TABS
650.0000 mg | ORAL_TABLET | Freq: Three times a day (TID) | ORAL | Status: DC | PRN
Start: 2016-04-06 — End: 2017-11-26

## 2016-04-06 MED ORDER — GABAPENTIN 300 MG PO CAPS
600.0000 mg | ORAL_CAPSULE | Freq: Every day | ORAL | Status: DC
  Administered 2016-04-06: 600 mg via ORAL
  Filled 2016-04-06: qty 2

## 2016-04-06 MED ORDER — CEPHALEXIN 500 MG PO CAPS
500.0000 mg | ORAL_CAPSULE | Freq: Two times a day (BID) | ORAL | Status: DC
  Administered 2016-04-06: 500 mg via ORAL
  Filled 2016-04-06: qty 1

## 2016-04-06 NOTE — Progress Notes (Addendum)
Transitions of Care Pharmacy Note  Plan:  -Educated on Keflex course to finish until 4/10, and re-educated on Eliquis. -Addressed concerns regarding her gabapentin, furosemide, and kidney function. She also endorsed dry mouth, and asked which of her medications could cause this. I informed her that [name] for urinary incontinence can cause dry mouth, and then she further asked why she was taking Toviaz and furosemide together.   -Outpatient: Recommend following up if patient needs to continue taking Toviaz.  --------------------------------------------- Elaine Lee is an 71 y.o. female who presents with a chief complaint Back pain, right knee pain and pleuritic chest pain. In anticipation of discharge, pharmacy has reviewed this patient's prior to admission medication history, as well as current inpatient medications listed per the [facility].  Current medication indications, dosing, frequency, and notable side effects reviewed with patient. patient verbalized understanding of current inpatient medication regimen and is aware that the After Visit Summary when presented, will represent the most accurate medication list at discharge.   Elaine Lee expressed concerns regarding her gabapentin, furosemide, and kidney function.  Assessment: Understanding of regimen: good Understanding of indications: good Potential of compliance: good Barriers to Obtaining Medications: No  Patient instructed to contact inpatient pharmacy team with further questions or concerns if needed.    Time spent preparing for discharge counseling: 10 minutes Time spent counseling patient: 30 minutes   Thank you for allowing pharmacy to be a part of this patient's care.   Mackie Pai, PharmD PGY1 Pharmacy Resident Pager: [phone] 04/06/2016 3:20 PM

## 2016-04-06 NOTE — Progress Notes (Signed)
  Echocardiogram 2D Echocardiogram has been performed.  Elaine Lee 04/06/2016, 4:22 PM

## 2016-04-06 NOTE — Progress Notes (Signed)
Patient will DC to: [facility] Anticipated DC date: 04/06/16 Family notified: Spouse Transport by: Sharin Mons   Per MD patient ready for DC to [facility]. RN, patient, patient's family, and facility notified of DC. Discharge Summary sent to facility. RN given number for report. DC packet on chart. Ambulance transport requested for patient.   CSW signing off.  Cristobal Goldmann, [place] Clinical Social Worker [phone]

## 2016-04-06 NOTE — Progress Notes (Signed)
Nsg Discharge Note  Admit Date:  04/04/2016 Discharge date: 04/06/2016   [name] to be D/C'd Skilled nursing facility per MD order.  AVS completed.  Copy for chart, and copy for patient signed, and dated. Patient/caregiver able to verbalize understanding.  Discharge Medication: Allergies as of 04/06/2016      Reactions   Other Shortness Of Breath   ROSEMARY AND HEAVY SCENTED PERFUMES, DETERGENTS, ETC   Tape Itching, Rash   Burns and blisters USE PAPER TAPE   Celebrex [celecoxib] Other (See Comments)   HARD TIME HEARING, LIKE BEING UNDERWATER   Latex Hives, Itching, Rash   Other reaction(s): RASH   Mysoline [primidone] Other (See Comments)   unknown   Niacin And Related Hives, Swelling   Sulfa Antibiotics Other (See Comments)   TONGUE BLISTERS    Aspirin Other (See Comments)   GI PROBLEMS AND PAIN      Medication List    TAKE these medications   acetaminophen 325 MG tablet Commonly known as:  TYLENOL Take 2 tablets (650 mg total) by mouth every 8 (eight) hours as needed. What changed:  when to take this  reasons to take this   albuterol 108 (90 Base) MCG/ACT inhaler Commonly known as:  PROVENTIL HFA;VENTOLIN HFA Inhale 2 puffs into the lungs every 6 (six) hours as needed for wheezing or shortness of breath.   antiseptic oral rinse Liqd 15 mLs by Mouth Rinse route as needed for dry mouth.   atorvastatin 10 MG tablet Commonly known as:  LIPITOR Take 10 mg by mouth daily.   BIOFREEZE ROLL-ON 4 % Gel Generic drug:  Menthol (Topical Analgesic) Apply 1 application topically 2 (two) times daily as needed (for joint pain).   budesonide-formoterol 160-4.5 MCG/ACT inhaler Commonly known as:  SYMBICORT Inhale 2 puffs into the lungs 2 (two) times daily.   cephALEXin 500 MG capsule Commonly known as:  KEFLEX Take 1 capsule (500 mg total) by mouth every 12 (twelve) hours. Until 4/10   citalopram 20 MG tablet Commonly known as:  CELEXA Take 20 mg by mouth  daily.   diltiazem 240 MG 24 hr capsule Commonly known as:  CARDIZEM CD Take 240 mg by mouth daily.   ELIQUIS 5 MG Tabs tablet Generic drug:  apixaban Take 5 mg by mouth 2 (two) times daily.   furosemide 40 MG tablet Commonly known as:  LASIX Take 40 mg by mouth 2 (two) times daily.   furosemide 20 MG tablet Commonly known as:  LASIX Take 20 mg by mouth daily.   gabapentin 600 MG tablet Commonly known as:  NEURONTIN Take 1 tablet (600 mg total) by mouth 2 (two) times daily. What changed:  how much to take  additional instructions   guaiFENesin 600 MG 12 hr tablet Commonly known as:  MUCINEX Take by mouth 2 (two) times daily as needed for cough.   hydrocortisone 25 MG suppository Commonly known as:  ANUSOL-HC Place 25 mg rectally 2 (two) times daily as needed for hemorrhoids or itching.   insulin aspart protamine- aspart (70-30) 100 UNIT/ML injection Commonly known as:  NOVOLOG MIX 70/30 Inject 0.1 mLs (10 Units total) into the skin 2 (two) times daily with a meal. Takes 25 units in am and 14 units in pm What changed:  how much to take   ipratropium-albuterol 0.5-2.5 (3) MG/3ML Soln Commonly known as:  DUONEB Take 3 mLs by nebulization 4 (four) times daily.   loratadine 10 MG tablet Commonly known as:  CLARITIN  Take 10 mg by mouth every morning.   losartan 100 MG tablet Commonly known as:  COZAAR Take 100 mg by mouth daily.   Melatonin 5 MG Tabs Take 1 tablet by mouth at bedtime.   montelukast 10 MG tablet Commonly known as:  SINGULAIR Take 1 tablet (10 mg total) by mouth at bedtime.   nystatin powder Generic drug:  nystatin Apply 1 Bottle topically 4 (four) times daily as needed (skin folds).   omeprazole 20 MG capsule Commonly known as:  PRILOSEC Take 20 mg by mouth daily.   OXYGEN Inhale 2-3 L into the lungs daily as needed (bedtime).   phenazopyridine 200 MG tablet Commonly known as:  PYRIDIUM Take 200 mg by mouth 3 (three) times daily as  needed (urinary burning).   polyethylene glycol packet Commonly known as:  MIRALAX / GLYCOLAX Take 17 g by mouth daily. What changed:  when to take this  reasons to take this   potassium chloride 10 MEQ tablet Commonly known as:  K-DUR,KLOR-CON Take 10 mEq by mouth daily.   simethicone 125 MG chewable tablet Commonly known as:  MYLICON Chew 125 mg by mouth every 6 (six) hours as needed for flatulence.   TOVIAZ 8 MG Tb24 tablet Generic drug:  fesoterodine Take 8 mg by mouth daily.   traMADol 50 MG tablet Commonly known as:  ULTRAM Take 50 mg by mouth every 6 (six) hours as needed for moderate pain or severe pain.   URIBEL 118 MG Caps Take 1 capsule by mouth 4 (four) times daily as needed (bladder spasms).   VICTOZA 18 MG/3ML Sopn Generic drug:  liraglutide Inject 1.8 mg into the skin daily.       Discharge Assessment: Vitals:   04/06/16 0519 04/06/16 1423  BP: (!) 147/58 (!) 127/57  Pulse: 78 64  Resp: 18 16  Temp: 98.2 F (36.8 C) 98.2 F (36.8 C)   Skin clean, dry and intact without evidence of skin break down, no evidence of skin tears noted. IV catheter discontinued intact. Site without signs and symptoms of complications - no redness or edema noted at insertion site, patient denies c/o pain - only slight tenderness at site.  Dressing with slight pressure applied.  D/c Instructions-Education: Discharge instructions given to patient/family with verbalized understanding. D/c education completed with patient/family including follow up instructions, medication list, d/c activities limitations if indicated, with other d/c instructions as indicated by MD - patient able to verbalize understanding, all questions fully answered. Patient instructed to return to ED, call 911, or call MD for any changes in condition.  Patient discharged to [facility]. Report given to nurse at [facility]. Pt to be transported via [facility].  Camillo Flaming, RN 04/06/2016 5:01 PM

## 2016-04-06 NOTE — Clinical Social Work Placement (Signed)
   CLINICAL SOCIAL WORK PLACEMENT  NOTE  Date:  04/06/2016  Patient Details  Name: Elaine Lee MRN: [id number] Date of Birth: 08/27/1945  Clinical Social Work is seeking post-discharge placement for this patient at the Skilled  Nursing Facility level of care (*CSW will initial, date and re-position this form in  chart as items are completed):  Yes   Patient/family provided with [place] Clinical Social Work Department's list of facilities offering this level of care within the geographic area requested by the patient (or if unable, by the patient's family).  Yes   Patient/family informed of their freedom to choose among providers that offer the needed level of care, that participate in Medicare, Medicaid or managed care program needed by the patient, have an available bed and are willing to accept the patient.  Yes   Patient/family informed of [facility]'s ownership interest in [facility] and [facility], as well as of the fact that they are under no obligation to receive care at these facilities.  PASRR submitted to EDS on 04/06/16     PASRR number received on 04/06/16     Existing PASRR number confirmed on       FL2 transmitted to all facilities in geographic area requested by pt/family on 04/06/16     FL2 transmitted to all facilities within larger geographic area on       Patient informed that his/her managed care company has contracts with or will negotiate with certain facilities, including the following:        Yes   Patient/family informed of bed offers received.  Patient chooses bed at [facility]     Physician recommends and patient chooses bed at      Patient to be transferred to [facility] on 04/06/16.  Patient to be transferred to facility by [facility]     Patient family notified on 04/06/16 of transfer.  Name of family member notified:  Spouse     PHYSICIAN Please sign FL2     Additional Comment:     _______________________________________________ Mearl Latin, LCSWA 04/06/2016, 3:01 PM

## 2016-04-06 NOTE — Clinical Social Work Note (Signed)
Clinical Social Work Assessment  Patient Details  Name: Elaine Lee MRN: [id number] Date of Birth: 1945-11-28  Date of referral:  04/06/16               Reason for consult:  Facility Placement                Permission sought to share information with:  Facility Medical sales representative, Family Supports Permission granted to share information::  Yes, Verbal Permission Granted  Name::     Conservation officer, nature::  SNFs  Relationship::  Spouse  Contact Information:     Housing/Transportation Living arrangements for the past 2 months:  Single Family Home Source of Information:  Patient, Spouse Patient Interpreter Needed:  None Criminal Activity/Legal Involvement Pertinent to Current Situation/Hospitalization:  No - Comment as needed Significant Relationships:  Spouse Lives with:  Spouse Do you feel safe going back to the place where you live?  No Need for family participation in patient care:  Yes (Comment)  Care giving concerns:  CSW received consult for possible SNF placement at time of discharge. CSW spoke with patient and her spouse by phone regarding PT recommendation of SNF placement at time of discharge. Patient's spouse reported that patient's spouse is currently unable to care for patient at their home given patient's current physical needs and fall risk. Patient works with the [employer] Patient expressed understanding of PT recommendation and is agreeable to SNF placement at time of discharge. CSW to continue to follow and assist with discharge planning needs.   Social Worker assessment / plan:  CSW spoke with patient's spouse concerning possibility of rehab at [facility] before returning home.  Employment status:  Retired Health and safety inspector:  Other (Comment Required) ([facility]) PT Recommendations:  Skilled Nursing Facility Information / Referral to community resources:  Skilled Nursing Facility  Patient/Family's Response to care:  Patient recognizes need for rehab before returning  home and is agreeable to a SNF in [place]. Patient reported preference for [facility].  Patient/Family's Understanding of and Emotional Response to Diagnosis, Current Treatment, and Prognosis:  Patient/family is realistic regarding therapy needs and expressed being hopeful for SNF placement. Patient is pleasant but a little disoriented when asked about her medical diagnosis. Patient expressed understanding of CSW role and discharge process. No questions/concerns about plan or treatment.    Emotional Assessment Appearance:  Appears stated age Attitude/Demeanor/Rapport:  Other (Appropriate) Affect (typically observed):  Accepting Orientation:  Oriented to Self, Oriented to Situation, Oriented to Place Alcohol / Substance use:  Not Applicable Psych involvement (Current and /or in the community):  No (Comment)  Discharge Needs  Concerns to be addressed:  Care Coordination Readmission within the last 30 days:  No Current discharge risk:  None Barriers to Discharge:  No Barriers Identified   Mearl Latin, LCSWA 04/06/2016, 10:30 AM

## 2016-04-06 NOTE — Evaluation (Deleted)
Occupational Therapy Evaluation Patient Details Name: Elaine Lee MRN: [id number] DOB: Mar 17, 1945 Today's Date: 04/06/2016    History of Present Illness Pt is a 71 y/o female admitted following a fall with knee/back pain.  PMH significant for Afib, CKD III, COPD, DM, HTN. Films (-) for fracture in knee and back   Clinical Impression   Limited eval secondary to pain and perseverating on receiving breakfast tray.  Pt requires min assist sit <> stand, demonstrates difficulty reaching towards feet due to back pain with mobility.  Pt requires increased time due to pain as well as internal distraction.  Pt reports plan is to d/c today to SNF to maximize independence and decrease caregiver burden prior to d/c home.  Pt is also a part of [facility] program who has provided equipment and services for pt.    Follow Up Recommendations  SNF    Equipment Recommendations  None recommended by OT    Recommendations for Other Services       Precautions / Restrictions Precautions Precautions: Fall Precaution Comments: watch O2      Mobility   Transfers Overall transfer level: Needs assistance Equipment used: Rolling walker (2 wheeled) Transfers: Sit to/from Stand Sit to Stand: Min assist         General transfer comment: takes increased time to come to standing due to internal distractions, pt very tangential        ADL either performed or assessed with clinical judgement   ADL Overall ADL's : Needs assistance/impaired     Grooming: Set up;Supervision/safety;Sitting   Upper Body Bathing: Minimal assistance;Sitting;Set up   Lower Body Bathing: Moderate assistance;Sit to/from stand Lower Body Bathing Details (indicate cue type and reason): Min assist for sit > stand     Lower Body Dressing: Moderate assistance;Sit to/from stand Lower Body Dressing Details (indicate cue type and reason): Min assist for sit > stand Toilet Transfer: Stand-pivot;Minimal assistance;RW;BSC    Toileting- Clothing Manipulation and Hygiene: Minimal assistance;Sit to/from stand               Vision Baseline Vision/History: Wears glasses Wears Glasses: At all times Patient Visual Report: No change from baseline Vision Assessment?: No apparent visual deficits            Pertinent Vitals/Pain Pain Assessment: Faces Faces Pain Scale: Hurts even more Pain Location: back Pain Intervention(s): Limited activity within patient's tolerance     Hand Dominance Right   Extremity/Trunk Assessment Upper Extremity Assessment Upper Extremity Assessment: Generalized weakness;RUE deficits/detail RUE Deficits / Details: shoulder flexion limited to 70 degrees due to rotator cuff injury and decreased success with repair RUE Coordination: decreased gross motor           Communication Communication Communication: HOH   Cognition Arousal/Alertness: Awake/alert Behavior During Therapy: WFL for tasks assessed/performed Overall Cognitive Status: Difficult to assess                                                Home Living Family/patient expects to be discharged to:: Private residence Living Arrangements: Spouse/significant other Available Help at Discharge: Available 24 hours/day Type of Home: House Home Access: Ramped entrance     Home Layout: One level     Bathroom Shower/Tub: Walk-in shower         Home Equipment: Environmental consultant - 2 wheels;Walker - 4 wheels;Grab bars - toilet;Grab bars -  tub/shower;Hospital bed;Wheelchair - [employer];Shower seat   Additional Comments: pt states she chooses her ambulatory aid based on her daily needs      Prior Functioning/Environment Level of Independence: Independent with assistive device(s)                 OT Problem List: Decreased strength;Decreased range of motion;Decreased activity tolerance;Cardiopulmonary status limiting activity;Obesity;Impaired UE functional use;Pain         OT  Goals(Current goals can be found in the care plan section) Acute Rehab OT Goals Patient Stated Goal: to get stronger OT Goal Formulation: With patient Time For Goal Achievement: 04/20/16 Potential to Achieve Goals: Good      End of Session Equipment Utilized During Treatment: Rolling walker;Gait belt Nurse Communication: Mobility status  Activity Tolerance: Patient limited by pain Patient left: in chair;with call bell/phone within reach;with family/visitor present  OT Visit Diagnosis: Unsteadiness on feet (R26.81);History of falling (Z91.81);Muscle weakness (generalized) (M62.81);Pain Pain - part of body:  (back)                Time: 1610-9604 OT Time Calculation (min): 16 min Charges:  OT General Charges $OT Visit: 1 Procedure OT Evaluation $OT Eval Moderate Complexity: 1 Procedure   Rosalio Loud, [phone] 04/06/2016, 10:52 AM

## 2016-04-06 NOTE — Progress Notes (Signed)
Subjective:  No acute events overnight. Pt resting comfortably in the chair. Is willing to go to SNF   Objective: Vital signs in last 24 hours: Vitals:   04/05/16 1608 04/05/16 1651 04/05/16 2133 04/06/16 0519  BP: (!) 122/46  129/76 (!) 147/58  Pulse: 73  91 78  Resp: Temp: 98.2 F (36.8 C)  98.2 F (36.8 C) 98.2 F (36.8 C)  TempSrc:   Oral   SpO2: 95% 95% 92% (!) 89%  Weight:    297 lb 13.5 oz (135.1 kg)  Height:        Intake/Output Summary (Last 24 hours) at 04/06/16 0740 Last data filed at 04/05/16 1700  Gross per 24 hour  Intake              600 ml  Output                0 ml  Net              600 ml    Physical Exam General appearance: resting comfortably.  HENT: Normocephalic, atraumatic Cardiovascular: distant heart sounds , no murmurs, rubs, gallops Respiratory/Chest: Clear to ausculation bilaterally, normal work of breathing Abdomen: Bowel sounds present, soft, non-tender, non-distended Extremities:1+ pitting edema b/l.   Labs / Imaging / Procedures: CBC Latest Ref Rng & Units 04/06/2016 04/05/2016 04/04/2016  WBC 4.0 - 10.5 K/uL 6.0 10.3 15.1(H)  Hemoglobin 12.0 - 15.0 g/dL 9.0(L) 9.1(L) 9.2(L)  Hematocrit 36.0 - 46.0 % 29.3(L) 29.0(L) 29.7(L)  Platelets 150 - 400 K/uL 167 151 172   BMP Latest Ref Rng & Units 04/06/2016 04/05/2016 04/04/2016  Glucose 65 - 99 mg/dL 409(W) 119(J) 478(G)  BUN 6 - 20 mg/dL 17 95(A) 21(H)  Creatinine 0.44 - 1.00 mg/dL 0.86 5.78(I) 6.96(E)  Sodium 135 - 145 mmol/L 140 137 139  Potassium 3.5 - 5.1 mmol/L 4.1 4.2 4.3  Chloride 101 - 111 mmol/L 103 102 103  CO2 22 - 32 mmol/L Calcium 8.9 - 10.3 mg/dL 9.5(M) 8.4(X) 3.2(G)   No results found.  Assessment/Plan: 71 yo woman with morbid obesity, sleep apnea, COPD who follows at [facility] admitted for dehydration and UTI, and some tremors and fall.   Principal Problem:   UTI (urinary tract infection) Active Problems:   Morbid obesity (HCC)   Fall   Pleuritic  chest pain   AKI (acute kidney injury) (HCC)   Tremor  Proteus UTI with history of chronic urinary incontinence: Cultures grew Proteus. Sensitivities pending. Blood cultures are negative.   -will transition to 7 days of Keflex starting today. Follow up with patient if antibiotics needs to be changed depending on sensitivities   -- await sensitivities  -continue fesoterodine- recommend re-evaluation of this medicine at follow up appt due to it being on Beers criteria and predisposition to recurrent UTIs.    AKI: Cr is back to baseline from 1.6 on admission.  -resume lasix and losartan on discharge  Normocytic chronic anaemia. Ferritin normal. HgB has been chronically stable at 9.0 -outpatient follow up for further eval.   Fall: likely due to underlying UTI and dehydration.  She is only on one SSRI and tramadol.  -- PT rec SNF -- SNF- Contacted [facility] wiho will coordinate with CSW here.  Pleuritic chest pain: resolved. Trop neg.  Elevated PA pressures on prior echo and enlarged cardiac silhoutte on current cxr -- pending echo  T2DM, PCP had some concern over overtreated DM, was  taking 70/30 25 units QAM and 14 QPM with meals at home. Maintaining CBGs between 150-200 on 7 units BID and SSI  -- Increased 70/30 to 10 units BID -- SSI-S and CBGs TID AC   -discharge with 10 units of Novolog 70/30 BID- titrate in outpatient   Dispo: Anticipated discharge today   LOS: 1 day   Deneise Lever, MD 04/06/2016, 7:40 AM

## 2016-04-06 NOTE — Progress Notes (Signed)
Pasrr: [id number] Fredrich Birks Jerzi Tigert LCSWA [phone]

## 2016-04-06 NOTE — [place] FL2 (Signed)
[place] MEDICAID FL2 LEVEL OF CARE SCREENING TOOL     IDENTIFICATION  Patient Name: Elaine Lee Birthdate: 1945-04-19 Sex: female Admission Date (Current Location): 04/04/2016  [facility] and [place] Number:  Producer, television/film/video and Address:  [facility] [place], [address] [place], [place] [address]      Provider Number: [id number]  Attending Physician Name and Address:  Tyson Alias, MD  Relative Name and Phone Number:  Alvira Monday, [phone]    Current Level of Care: Hospital Recommended Level of Care: Skilled Nursing Facility Prior Approval Number:    Date Approved/Denied:   PASRR Number:    Discharge Plan: SNF    Current Diagnoses: Patient Active Problem List   Diagnosis Date Noted  . UTI (urinary tract infection) 04/05/2016  . Pleuritic chest pain 04/05/2016  . AKI (acute kidney injury) (HCC) 04/05/2016  . Tremor 04/05/2016  . Fall   . Asthma flare 10/25/2014  . OSA (obstructive sleep apnea) 10/25/2014  . Morbid obesity (HCC) 10/25/2014  . Acute on chronic respiratory failure with hypoxia (HCC)   . DM2 (diabetes mellitus, type 2) (HCC) 06/14/2014  . CKD (chronic kidney disease), stage III 06/14/2014  . Neuropathic pain 06/14/2014  . HTN (hypertension) 06/14/2014  . Type 2 diabetes with stage 3 chronic kidney disease GFR 30-59 (HCC)   . COPD exacerbation (HCC) 06/13/2014  . Acute exacerbation of CHF (congestive heart failure) (HCC) 06/13/2014    Orientation RESPIRATION BLADDER Height & Weight     Self, Time, Situation, Place  Normal Continent Weight: 135.1 kg (297 lb 13.5 oz) Height:   (165.1 cm)  BEHAVIORAL SYMPTOMS/MOOD NEUROLOGICAL BOWEL NUTRITION STATUS      Continent Diet (Please see DC Summary)  AMBULATORY STATUS COMMUNICATION OF NEEDS Skin   Extensive Assist Verbally Normal                       Personal Care Assistance Level of Assistance  Bathing, Feeding, Dressing Bathing Assistance:  Maximum assistance Feeding assistance: Limited assistance Dressing Assistance: Limited assistance     Functional Limitations Info             SPECIAL CARE FACTORS FREQUENCY  PT (By licensed PT)     PT Frequency: 5x/week              Contractures      Additional Factors Info  Code Status, Allergies, Insulin Sliding Scale Code Status Info: Full Allergies Info: Other, Tape, Celebrex Celecoxib, Latex, Mysoline Primidone, Niacin And Related, Sulfa Antibiotics, Aspirin   Insulin Sliding Scale Info: 3x daily and at meals       Current Medications (04/06/2016):  This is the current hospital active medication list Current Facility-Administered Medications  Medication Dose Route Frequency Provider Last Rate Last Dose  . acetaminophen (TYLENOL) tablet 650 mg  650 mg Oral Q6H PRN Lora Paula, MD   650 mg at 04/05/16 2135   Or  . acetaminophen (TYLENOL) suppository 650 mg  650 mg Rectal Q6H PRN Lora Paula, MD      . albuterol (PROVENTIL) (2.5 MG/3ML) 0.083% nebulizer solution 2.5 mg  2.5 mg Nebulization Q2H PRN Lora Paula, MD      . apixaban Everlene Balls) tablet 5 mg  5 mg Oral BID Lora Paula, MD   5 mg at 04/05/16 2134  . atorvastatin (LIPITOR) tablet 10 mg  10 mg Oral q1800 Lora Paula, MD   10 mg at  04/05/16 1842  . cefTRIAXone (ROCEPHIN) 1 g in dextrose 5 % 50 mL IVPB  1 g Intravenous Q24H Tilden Fossa, MD 100 mL/hr at 04/05/16 2132 1 g at 04/05/16 2132  . citalopram (CELEXA) tablet 20 mg  20 mg Oral QHS Lora Paula, MD   20 mg at 04/05/16 2134  . diltiazem (CARDIZEM CD) 24 hr capsule 240 mg  240 mg Oral Daily Lora Paula, MD   240 mg at 04/05/16 1024  . fesoterodine (TOVIAZ) tablet 8 mg  8 mg Oral Daily Lora Paula, MD   8 mg at 04/05/16 1003  . hydrocortisone (ANUSOL-HC) suppository 25 mg  25 mg Rectal BID PRN Lora Paula, MD      . insulin aspart (novoLOG) injection 0-5 Units  0-5 Units Subcutaneous QHS Lora Paula, MD      .  insulin aspart (novoLOG) injection 0-9 Units  0-9 Units Subcutaneous TID WC Lora Paula, MD   2 Units at 04/05/16 1840  . insulin aspart protamine- aspart (NOVOLOG MIX 70/30) injection 10 Units  10 Units Subcutaneous BID WC Deneise Lever, MD      . ipratropium-albuterol (DUONEB) 0.5-2.5 (3) MG/3ML nebulizer solution 3 mL  3 mL Nebulization QID Lora Paula, MD   3 mL at 04/05/16 1650  . loratadine (CLARITIN) tablet 10 mg  10 mg Oral q morning - 10a Lora Paula, MD   10 mg at 04/05/16 1003  . Melatonin TABS 3 mg  3 mg Oral QHS Lora Paula, MD   3 mg at 04/05/16 2133  . mometasone-formoterol (DULERA) 200-5 MCG/ACT inhaler 2 puff  2 puff Inhalation BID Lora Paula, MD   2 puff at 04/05/16 0901  . montelukast (SINGULAIR) tablet 10 mg  10 mg Oral QHS Lora Paula, MD   10 mg at 04/05/16 2134  . MUSCLE RUB CREA   Topical PRN Lora Paula, MD      . pantoprazole (PROTONIX) EC tablet 40 mg  40 mg Oral Daily Lora Paula, MD   40 mg at 04/05/16 1003  . polyethylene glycol (MIRALAX / GLYCOLAX) packet 17 g  17 g Oral Daily PRN Lora Paula, MD      . sodium chloride flush (NS) 0.9 % injection 3 mL  3 mL Intravenous Q12H Lora Paula, MD   3 mL at 04/05/16 2135  . traMADol (ULTRAM) tablet 50 mg  50 mg Oral Q6H PRN Lora Paula, MD   50 mg at 04/05/16 2318  . URELLE (URELLE/URISED) 81 MG tablet 81 mg  81 mg Oral QID PRN Lora Paula, MD         Discharge Medications: Please see discharge summary for a list of discharge medications.  Relevant Imaging Results:  Relevant Lab Results:   Additional Information SSN: [id number] [place], [place]

## 2016-04-06 NOTE — Progress Notes (Signed)
qPhysical Therapy Treatment Patient Details Name: Elaine Lee MRN: [id number] DOB: January 20, 1945 Today's Date: 04/06/2016    History of Present Illness Pt is a 71 y/o female admitted following a fall with knee/back pain.  PMH significant for Afib, CKD III, COPD, DM, HTN. Films (-) for fracture in knee and back    PT Comments    Pt performed mulitple sit to stand from various seated surfaces and surface heights.  Cues for hand placement and forward weight shifting with assist to boost into standing.  Pt remains to present with pain in Low back during mobility.  Plan for short term rehab remains appropriate.     Follow Up Recommendations  SNF;Supervision/Assistance - 24 hour     Equipment Recommendations  None recommended by PT    Recommendations for Other Services       Precautions / Restrictions Precautions Precautions: Fall Precaution Comments: watch O2 Restrictions Weight Bearing Restrictions: No    Mobility  Bed Mobility               General bed mobility comments: Pt sitting edge of bed on arrival.    Transfers Overall transfer level: Needs assistance Equipment used: Rolling walker (2 wheeled) Transfers: Sit to/from Stand Sit to Stand: Mod assist         General transfer comment: Pt required increased time with assist to boost into standing.  Pt used rocking momentum to create movement forward to achieve standing.    Ambulation/Gait Ambulation/Gait assistance: Min guard Ambulation Distance (Feet): 12 Feet (+ 16 ft) Assistive device: Rolling walker (2 wheeled) Gait Pattern/deviations: Step-through pattern;Trunk flexed;Wide base of support Gait velocity: slow 2/2 pain Gait velocity interpretation: Below normal speed for age/gender General Gait Details: Cues for RW safety and negotiating obstacles in room.  Cues for turning and backing.     Stairs            Wheelchair Mobility    Modified Rankin (Stroke Patients Only)       Balance  Overall balance assessment: Needs assistance Sitting-balance support: Bilateral upper extremity supported       Standing balance support: Bilateral upper extremity supported Standing balance-Leahy Scale: Fair                              Cognition Arousal/Alertness: Awake/alert Behavior During Therapy: WFL for tasks assessed/performed Overall Cognitive Status: Difficult to assess                                        Exercises      General Comments        Pertinent Vitals/Pain Pain Assessment: Faces Faces Pain Scale: Hurts even more Pain Location: back Pain Descriptors / Indicators: Aching;Grimacing;Guarding Pain Intervention(s): Repositioned    Home Living                      Prior Function            PT Goals (current goals can now be found in the care plan section) Acute Rehab PT Goals Patient Stated Goal: to get stronger Potential to Achieve Goals: Good Progress towards PT goals: Progressing toward goals    Frequency    Min 2X/week      PT Plan Current plan remains appropriate    Co-evaluation  End of Session Equipment Utilized During Treatment: Gait belt Activity Tolerance: Patient limited by fatigue Patient left: in chair;with call bell/phone within reach Nurse Communication: Mobility status PT Visit Diagnosis: Unsteadiness on feet (R26.81);Muscle weakness (generalized) (M62.81);Repeated falls (R29.6)     Time: 1308-6578 PT Time Calculation (min) (ACUTE ONLY): 24 min  Charges:  $Gait Training: 8-22 mins $Therapeutic Activity: 8-22 mins                    G Codes:       Joycelyn Rua, PTA pager [phone]    Florestine Avers 04/06/2016, 4:49 PM

## 2016-04-06 NOTE — Discharge Summary (Signed)
Name: Elaine Lee MRN: [id number] DOB: 1945-08-19 71 y.o. PCP: Jethro Bastos, MD  Date of Admission: 04/04/2016  3:48 PM Date of Discharge: 04/06/2016 Attending Physician: Tyson Alias, MD  Discharge Diagnosis: 1. Proteus UTI 2. AKI 3. Fall  Principal Problem:   UTI (urinary tract infection) Active Problems:   Type 2 diabetes with stage 3 chronic kidney disease GFR 30-59 (HCC)   Morbid obesity (HCC)   Fall   Pleuritic chest pain   AKI (acute kidney injury) (HCC)   Tremor   Discharge Medications: Allergies as of 04/06/2016      Reactions   Other Shortness Of Breath   ROSEMARY AND HEAVY SCENTED PERFUMES, DETERGENTS, ETC   Tape Itching, Rash   Burns and blisters USE PAPER TAPE   Celebrex [celecoxib] Other (See Comments)   HARD TIME HEARING, LIKE BEING UNDERWATER   Latex Hives, Itching, Rash   Other reaction(s): RASH   Mysoline [primidone] Other (See Comments)   unknown   Niacin And Related Hives, Swelling   Sulfa Antibiotics Other (See Comments)   TONGUE BLISTERS    Aspirin Other (See Comments)   GI PROBLEMS AND PAIN      Medication List    TAKE these medications   acetaminophen 325 MG tablet Commonly known as:  TYLENOL Take 2 tablets (650 mg total) by mouth every 8 (eight) hours as needed. What changed:  when to take this  reasons to take this   albuterol 108 (90 Base) MCG/ACT inhaler Commonly known as:  PROVENTIL HFA;VENTOLIN HFA Inhale 2 puffs into the lungs every 6 (six) hours as needed for wheezing or shortness of breath.   antiseptic oral rinse Liqd 15 mLs by Mouth Rinse route as needed for dry mouth.   atorvastatin 10 MG tablet Commonly known as:  LIPITOR Take 10 mg by mouth daily.   BIOFREEZE ROLL-ON 4 % Gel Generic drug:  Menthol (Topical Analgesic) Apply 1 application topically 2 (two) times daily as needed (for joint pain).   budesonide-formoterol 160-4.5 MCG/ACT inhaler Commonly known as:  SYMBICORT Inhale 2 puffs into  the lungs 2 (two) times daily.   cephALEXin 500 MG capsule Commonly known as:  KEFLEX Take 1 capsule (500 mg total) by mouth every 12 (twelve) hours. Until 4/10   citalopram 20 MG tablet Commonly known as:  CELEXA Take 20 mg by mouth daily.   diltiazem 240 MG 24 hr capsule Commonly known as:  CARDIZEM CD Take 240 mg by mouth daily.   ELIQUIS 5 MG Tabs tablet Generic drug:  apixaban Take 5 mg by mouth 2 (two) times daily.   furosemide 40 MG tablet Commonly known as:  LASIX Take 40 mg by mouth 2 (two) times daily.   furosemide 20 MG tablet Commonly known as:  LASIX Take 20 mg by mouth daily.   gabapentin 600 MG tablet Commonly known as:  NEURONTIN Take 1 tablet (600 mg total) by mouth 2 (two) times daily. What changed:  how much to take  additional instructions   guaiFENesin 600 MG 12 hr tablet Commonly known as:  MUCINEX Take by mouth 2 (two) times daily as needed for cough.   hydrocortisone 25 MG suppository Commonly known as:  ANUSOL-HC Place 25 mg rectally 2 (two) times daily as needed for hemorrhoids or itching.   insulin aspart protamine- aspart (70-30) 100 UNIT/ML injection Commonly known as:  NOVOLOG MIX 70/30 Inject 0.1 mLs (10 Units total) into the skin 2 (two) times daily with a meal.  Takes 25 units in am and 14 units in pm What changed:  how much to take   ipratropium-albuterol 0.5-2.5 (3) MG/3ML Soln Commonly known as:  DUONEB Take 3 mLs by nebulization 4 (four) times daily.   loratadine 10 MG tablet Commonly known as:  CLARITIN Take 10 mg by mouth every morning.   losartan 100 MG tablet Commonly known as:  COZAAR Take 100 mg by mouth daily.   Melatonin 5 MG Tabs Take 1 tablet by mouth at bedtime.   montelukast 10 MG tablet Commonly known as:  SINGULAIR Take 1 tablet (10 mg total) by mouth at bedtime.   nystatin powder Generic drug:  nystatin Apply 1 Bottle topically 4 (four) times daily as needed (skin folds).   omeprazole 20 MG  capsule Commonly known as:  PRILOSEC Take 20 mg by mouth daily.   OXYGEN Inhale 2-3 L into the lungs daily as needed (bedtime).   phenazopyridine 200 MG tablet Commonly known as:  PYRIDIUM Take 200 mg by mouth 3 (three) times daily as needed (urinary burning).   polyethylene glycol packet Commonly known as:  MIRALAX / GLYCOLAX Take 17 g by mouth daily. What changed:  when to take this  reasons to take this   potassium chloride 10 MEQ tablet Commonly known as:  K-DUR,KLOR-CON Take 10 mEq by mouth daily.   simethicone 125 MG chewable tablet Commonly known as:  MYLICON Chew 125 mg by mouth every 6 (six) hours as needed for flatulence.   TOVIAZ 8 MG Tb24 tablet Generic drug:  fesoterodine Take 8 mg by mouth daily.   traMADol 50 MG tablet Commonly known as:  ULTRAM Take 50 mg by mouth every 6 (six) hours as needed for moderate pain or severe pain.   URIBEL 118 MG Caps Take 1 capsule by mouth 4 (four) times daily as needed (bladder spasms).   VICTOZA 18 MG/3ML Sopn Generic drug:  liraglutide Inject 1.8 mg into the skin daily.       Disposition and follow-up:   Ms.Elaine Lee was discharged from [facility] in Stable condition.  At the hospital follow up visit please address:  1. Urinary tract infection - assess for urinary symptoms, ensure completed course of Keflex by 4/10. Patient's urine culture grew proteus and sensitivities are still pending.   AKI - assess renal function. We resumed lasix and losartan on discharge  Falls - assess mobility and frequency of falls  CHronic incontinence: Pt is on Fesoterodine- the med is listed in Beer's criteria and there is higher predisposition for getting recurrent UTIs. Recommend discussion of this medication with patient and assess risk/benefits to whether continuation will be beneficial for patient.   Type 2 diabetes mellitus - Her 70/30 insulin was decreased to 10 units twice a day, and this will  need to be titrated.   Enlarged cardiac silhoutte: seen on CXR. Ordered echo- results pending.   2.  Labs / imaging needed at time of follow-up: BMET  3.  Pending labs/ test needing follow-up: Urine culture sensitivities from 4/4, echo results from 4/6  Follow-up Appointments: Follow-up Information    Thane Edu, MD. Go to.   Specialty:  Family Medicine Why:  after rehab facility Contact information: [address] [place] [address] [phone]           Hospital Course by problem list: Principal Problem:   UTI (urinary tract infection) Active Problems:   Type 2 diabetes with stage 3 chronic kidney disease GFR 30-59 (  HCC)   Morbid obesity (HCC)   Fall   Pleuritic chest pain   AKI (acute kidney injury) (HCC)   Tremor      Proteus UTI with history of chronic urinary incontinence: Cultures grew Proteus. Sensitivities pending. Blood cultures are negative. Patient was initially kept on ceftriaxone and transitioned on oral Keflex starting 4/6 through 4/10. Sensitivities are still pending. She follows with urology for history of chronic incontinence.   AKI: Creatinine improved to baseline after hydration. Lasix and losartan were held, and will be resumed on discharge,  DM: PCP had some concern over overtreated DM, was taking 70/30 25 units QAM and 14 QPM with meals at home. Maintaining CBGs between 150-200s during this admission, and the 70/30 was decreased to 10 units BID on discharge to be titrated outpatient. It is possible she may be having hypoglycemic episodes.   Pleuritic Chest Pain Patient only briefly mentioned pleuritic chest pain during admission. However, this was an issue at her appointment when seen by her primary care physician.  Troponins in the emergency department negative. EKG showed A. fib and nonspecific T-wave changes.Chest radiograph demonstrated an enlarged cardiac silhouette from prior radiographs. Patient's pleuritic chest pain had  resolved the next morning. Repeat echo was done and results are pending.  Recurrent falls: Diagnostic radiographs of the right knee and lumbar spine did not reveal any acute osseous abnormality or fracture. She says she has had approximately 5 falls over the previous 6 months. They all are from different mechanisms and seem to be related to mechanical issues. Neurological examination was grossly intact. Likely the fall this time was related to UTI. SHe is only on tramadol PRN which we confirmed with [facility]. PT recommended SNF placement which we coordinated.  Tremors: Per PCP, she had some myoclonus prior to being sent to the ER. THis had resolved on the next morning. She is only on one SSRI and PRN tramadol, and her TSH was normal. Her insulin dose was adjusted on discharge.   Afib: Pt kept on apixaban, and diltiazem.   COPD: Kept on albuterol PRN, and singulair.  Depression  Continued Citalopram 20 mg once daily  Discharge Vitals:   BP (!) 127/57 (BP Location: Right Arm)   Pulse 64   Temp 98.2 F (36.8 C) (Oral)   Resp 16   Ht  (1.651 m)   Wt 297 lb 13.5 oz (135.1 kg)   SpO2 92%   BMI 49.56 kg/m   Pertinent Labs, Studies, and Procedures:  CBC Latest Ref Rng & Units 04/06/2016 04/05/2016 04/04/2016  WBC 4.0 - 10.5 K/uL 6.0 10.3 15.1(H)  Hemoglobin 12.0 - 15.0 g/dL 9.0(L) 9.1(L) 9.2(L)  Hematocrit 36.0 - 46.0 % 29.3(L) 29.0(L) 29.7(L)  Platelets 150 - 400 K/uL 167 151 172   BMP Latest Ref Rng & Units 04/06/2016 04/05/2016 04/04/2016  Glucose 65 - 99 mg/dL 098(J) 191(Y) 782(N)  BUN 6 - 20 mg/dL 17 56(O) 13(Y)  Creatinine 0.44 - 1.00 mg/dL 8.65 7.84(O) 9.62(X)  Sodium 135 - 145 mmol/L 140 137 139  Potassium 3.5 - 5.1 mmol/L 4.1 4.2 4.3  Chloride 101 - 111 mmol/L 103 102 103  CO2 22 - 32 mmol/L Calcium 8.9 - 10.3 mg/dL 5.2(W) 4.1(L) 2.4(M)    Recent Labs Lab 04/05/16 1214 04/05/16 1731 04/05/16 2123 04/06/16 0733 04/06/16 1204  GLUCAP 208* 180* 145* 158* 212*    Urinalysis    Component Value Date/Time   COLORURINE AMBER (A) 04/04/2016 1627   APPEARANCEUR  CLOUDY (A) 04/04/2016 1627   LABSPEC 1.017 04/04/2016 1627   PHURINE 8.0 04/04/2016 1627   GLUCOSEU NEGATIVE 04/04/2016 1627   HGBUR NEGATIVE 04/04/2016 1627   BILIRUBINUR NEGATIVE 04/04/2016 1627   KETONESUR NEGATIVE 04/04/2016 1627   PROTEINUR 30 (A) 04/04/2016 1627   UROBILINOGEN 0.2 06/14/2014 0636   NITRITE POSITIVE (A) 04/04/2016 1627   LEUKOCYTESUR NEGATIVE 04/04/2016 1627     Transthoracic echocardiogram 04/06/2016  Dg Chest 2 View  Result Date: 04/04/2016 CLINICAL DATA:  Patient fell. Cough. History of asthma. Morbid obesity. EXAM: CHEST  2 VIEW COMPARISON:  06/03/2015. FINDINGS: AP semierect radiograph demonstrates marked enlargement of the cardiac silhouette, increased from priors. Mild vascular congestion without visible pneumothorax, overt failure, or consolidation. Increased body habitus. IMPRESSION: Worsening aeration. Mild vascular congestion. Marked enlargement of cardiac silhouette, increased from priors. Electronically Signed   By: Elsie Stain M.D.   On: 04/04/2016 17:59   Dg Lumbar Spine Complete  Result Date: 04/04/2016 CLINICAL DATA:  71 year old who fell yesterday when her knees gave out. Patient complains of low back pain and right knee pain. Initial encounter. EXAM: LUMBAR SPINE - COMPLETE 4+ VIEW COMPARISON:  04/07/2015 and bone window images from CT abdomen and pelvis 10/15/2014, 08/13/2009. FINDINGS: Five non-rib-bearing lumbar vertebrae. Grade 2 spondylolisthesis of L4 on L5 measuring approximately 1.5 cm, likely degenerative in nature, as there are no visible pars defects. Degenerative disc disease with vacuum disc phenomenon at L4-5. Moderate disc space narrowing at L1-2 and mild disc space narrowing at L2-3. Facet degenerative changes from L3-4 through L5-S1. Osseous demineralization. No fractures. Visualized sacroiliac joints intact. Aortoiliac atherosclerosis  without evidence of aneurysm. IMPRESSION: 1. No acute osseous abnormality. 2. Degenerative grade 2 spondylolisthesis of L4 on L5 measuring approximately 1.5 cm. 3. Degenerative disc disease at L4-5, L1-2 and L2-3. 4. Facet degenerative changes from L3-4 through L5-S1. 5. Aortoiliac atherosclerosis. Electronically Signed   By: Hulan Saas M.D.   On: 04/04/2016 18:02   Dg Knee Complete 4 Views Right  Result Date: 04/04/2016 CLINICAL DATA:  71 year old who fell yesterday when her knees gave out. Patient complains of low back pain and right knee pain. Initial encounter. EXAM: RIGHT KNEE - COMPLETE 4+ VIEW COMPARISON:  09/18/2014. FINDINGS: No evidence of acute fracture or dislocation. Tricompartment joint space narrowing, severe in the lateral compartment and moderate in the medial and patellofemoral compartments, unchanged. Generalized osseous demineralization. No visible effusion. IMPRESSION: 1. No acute osseous abnormality. 2. 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 3. Osseous demineralization. Electronically Signed   By: Hulan Saas M.D.   On: 04/04/2016 18:05     Discharge Instructions: Discharge Instructions    Diet - low sodium heart healthy    Complete by:  As directed    Increase activity slowly    Complete by:  As directed     It was a pleasure taking care of you.  Please take the antibiotic until April 10th as per instructions. Please note the adjusted dose of insulin.  Please follow up with your PCP when you are discharged from the rehab facility- they will coordinate the appointment for you    Signed: Deneise Lever 04/06/2016, 2:31 PM

## 2016-04-06 NOTE — Progress Notes (Signed)
Pt complained of reflux discomfort and neuropathic pain in feet. MD on call was paged. Orders for Maalox and Neurontin were put in. Meds given. Will continue to monitor.

## 2016-04-06 NOTE — Evaluation (Signed)
Occupational Therapy Evaluation Patient Details Name: Elaine Lee MRN: [id number] DOB: 1945/04/02 Today's Date: 04/06/2016    History of Present Illness Pt is a 71 y/o female admitted following a fall with knee/back pain.  PMH significant for Afib, CKD III, COPD, DM, HTN. Films (-) for fracture in knee and back   Clinical Impression   Limited eval secondary to pain and perseverating on receiving breakfast tray.  Pt requires min assist sit <> stand, demonstrates difficulty reaching towards feet due to back pain with mobility.  Pt requires increased time due to pain as well as internal distraction.  Pt reports plan is to d/c today(?) to SNF to maximize independence and decrease caregiver burden prior to d/c home.  [facility] also [facility] who has provided equipment and services for pt.  Pt will benefit from OT acutely in preparation for d/c to venue below.    Follow Up Recommendations  SNF    Equipment Recommendations  None recommended by OT    Recommendations for Other Services       Precautions / Restrictions Precautions Precautions: Fall Precaution Comments: watch O2      Mobility  Transfers Overall transfer level: Needs assistance Equipment used: Rolling walker (2 wheeled) Transfers: Sit to/from Stand Sit to Stand: Min assist         General transfer comment: takes increased time to come to standing due to internal distractions, pt very tangential        ADL either performed or assessed with clinical judgement   ADL Overall ADL's : Needs assistance/impaired     Grooming: Set up;Supervision/safety;Sitting   Upper Body Bathing: Minimal assistance;Sitting;Set up   Lower Body Bathing: Moderate assistance;Sit to/from stand Lower Body Bathing Details (indicate cue type and reason): Min assist for sit > stand     Lower Body Dressing: Moderate assistance;Sit to/from stand Lower Body Dressing Details (indicate cue type and reason): Min assist for sit  > stand Toilet Transfer: Stand-pivot;Minimal assistance;RW;BSC   Toileting- Clothing Manipulation and Hygiene: Minimal assistance;Sit to/from stand               Vision Baseline Vision/History: Wears glasses Wears Glasses: At all times Patient Visual Report: No change from baseline Vision Assessment?: No apparent visual deficits            Pertinent Vitals/Pain Pain Assessment: Faces Faces Pain Scale: Hurts even more Pain Location: back Pain Intervention(s): Limited activity within patient's tolerance     Hand Dominance Right   Extremity/Trunk Assessment Upper Extremity Assessment Upper Extremity Assessment: Generalized weakness;RUE deficits/detail RUE Deficits / Details: shoulder flexion limited to 70 degrees due to rotator cuff injury and decreased success with repair RUE Coordination: decreased gross motor           Communication Communication Communication: HOH   Cognition Arousal/Alertness: Awake/alert Behavior During Therapy: WFL for tasks assessed/performed Overall Cognitive Status: Difficult to assess                                                Home Living Family/patient expects to be discharged to:: Private residence Living Arrangements: Spouse/significant other Available Help at Discharge: Available 24 hours/day Type of Home: House Home Access: Ramped entrance     Home Layout: One level     Bathroom Shower/Tub: Walk-in shower         Home  Equipment: Dan Humphreys - 2 wheels;Walker - 4 wheels;Grab bars - toilet;Grab bars - tub/shower;Hospital bed;Wheelchair - [employer];Shower seat   Additional Comments: pt states she chooses her ambulatory aid based on her daily needs      Prior Functioning/Environment Level of Independence: Independent with assistive device(s)                 OT Problem List: Decreased strength;Decreased range of motion;Decreased activity tolerance;Cardiopulmonary status limiting  activity;Obesity;Impaired UE functional use;Pain      OT Treatment/Interventions: Self-care/ADL training;Energy conservation;DME and/or AE instruction;Therapeutic activities;Patient/family education    OT Goals(Current goals can be found in the care plan section) Acute Rehab OT Goals Patient Stated Goal: to get stronger OT Goal Formulation: With patient Time For Goal Achievement: 04/20/16 Potential to Achieve Goals: Good  OT Frequency: Min 2X/week   Barriers to D/C: Decreased caregiver support             End of Session Equipment Utilized During Treatment: Rolling walker;Gait belt Nurse Communication: Mobility status  Activity Tolerance: Patient limited by pain Patient left: in chair;with call bell/phone within reach;with family/visitor present  OT Visit Diagnosis: Unsteadiness on feet (R26.81);History of falling (Z91.81);Muscle weakness (generalized) (M62.81);Pain Pain - part of body:  (back)                Time: 1610-9604 OT Time Calculation (min): 16 min Charges:  OT General Charges $OT Visit: 1 Procedure OT Evaluation $OT Eval Moderate Complexity: 1 Procedure   Rosalio Loud, [phone] 04/06/2016, 11:02 AM

## 2016-04-07 LAB — URINE CULTURE: Culture: >=100000 — AB

## 2016-04-09 ENCOUNTER — Telehealth: Payer: Self-pay | Admitting: Internal Medicine

## 2016-04-09 NOTE — Telephone Encounter (Signed)
  Reason for call:   I placed an outgoing call to [name] 's PCP at [facility] at 11:30 PM regarding the Urine culture results .   Assessment/ Plan:   I called [facility] and discussed with Dr Elaine Lee, the PCP for ms Elaine Lee  She Says that patient is symptomatically better and is going to be discharged from the SNF tomorrow back to her house.  Explained that the Urine culture grew proteus resistant to most antibiotics but given that she got better, it is not necessary to treat this as it is asymptomatic bacteruria. Tomorrow is the last dose of the keflex so she will finish out the course.   PCP agrees with the plan  The PCP agrees that the Elaine Lee is not a good long term medication for her but that it is prescribed by her urologist- and she will discuss with the pt at follow up appt  PCP mentioned that patient is looking forward to trying a new therapy called posterior tibial nerve stimulation for chronic urinary incontinence by urology.  As always, pt is advised that if symptoms worsen or new symptoms arise, they should go to an urgent care facility or to to ER for further evaluation.   Elaine Lever, MD   04/09/2016, 11:44 AM

## 2016-04-10 LAB — CULTURE, BLOOD (ROUTINE X 2)
CULTURE: NO GROWTH
Culture: NO GROWTH
SPECIAL REQUESTS: ADEQUATE
Special Requests: ADEQUATE

## 2016-07-20 ENCOUNTER — Other Ambulatory Visit: Payer: Self-pay | Admitting: Internal Medicine

## 2016-07-20 ENCOUNTER — Ambulatory Visit
Admission: RE | Admit: 2016-07-20 | Discharge: 2016-07-20 | Disposition: A | Discharge status: Home / Self Care | Payer: Medicare (Managed Care) | Source: Ambulatory Visit | Attending: Internal Medicine | Admitting: Internal Medicine

## 2016-07-20 DIAGNOSIS — R52 Pain, unspecified: Secondary | ICD-10-CM

## 2016-07-20 DIAGNOSIS — T148XXA Other injury of unspecified body region, initial encounter: Secondary | ICD-10-CM

## 2016-07-20 DIAGNOSIS — E1121 Type 2 diabetes mellitus with diabetic nephropathy: Secondary | ICD-10-CM

## 2016-08-21 ENCOUNTER — Other Ambulatory Visit: Payer: Self-pay | Admitting: Internal Medicine

## 2016-08-21 DIAGNOSIS — Z1231 Encounter for screening mammogram for malignant neoplasm of breast: Secondary | ICD-10-CM

## 2016-08-31 ENCOUNTER — Ambulatory Visit
Admission: RE | Admit: 2016-08-31 | Discharge: 2016-08-31 | Disposition: A | Discharge status: Home / Self Care | Payer: Medicare (Managed Care) | Source: Ambulatory Visit | Attending: Internal Medicine | Admitting: Internal Medicine

## 2016-08-31 DIAGNOSIS — Z1231 Encounter for screening mammogram for malignant neoplasm of breast: Secondary | ICD-10-CM

## 2016-10-16 ENCOUNTER — Other Ambulatory Visit: Payer: Self-pay | Admitting: Family Medicine

## 2016-10-16 DIAGNOSIS — R1310 Dysphagia, unspecified: Secondary | ICD-10-CM

## 2016-10-24 ENCOUNTER — Ambulatory Visit
Admission: RE | Admit: 2016-10-24 | Discharge: 2016-10-24 | Disposition: A | Discharge status: Home / Self Care | Payer: Medicare (Managed Care) | Source: Ambulatory Visit | Attending: Family Medicine | Admitting: Family Medicine

## 2016-10-24 DIAGNOSIS — R1310 Dysphagia, unspecified: Secondary | ICD-10-CM

## 2017-06-24 ENCOUNTER — Encounter (HOSPITAL_COMMUNITY): Payer: Self-pay

## 2017-06-24 ENCOUNTER — Emergency Department (HOSPITAL_COMMUNITY): Payer: Medicare (Managed Care)

## 2017-06-24 ENCOUNTER — Inpatient Hospital Stay (HOSPITAL_COMMUNITY)
Admission: EM | Admit: 2017-06-24 | Discharge: 2017-06-27 | DRG: 190 | Disposition: A | Discharge status: Home / Self Care | Payer: Medicare (Managed Care) | Attending: Internal Medicine | Admitting: Internal Medicine

## 2017-06-24 ENCOUNTER — Other Ambulatory Visit: Payer: Self-pay

## 2017-06-24 DIAGNOSIS — Z7951 Long term (current) use of inhaled steroids: Secondary | ICD-10-CM | POA: Diagnosis not present

## 2017-06-24 DIAGNOSIS — J181 Lobar pneumonia, unspecified organism: Secondary | ICD-10-CM | POA: Diagnosis present

## 2017-06-24 DIAGNOSIS — E1142 Type 2 diabetes mellitus with diabetic polyneuropathy: Secondary | ICD-10-CM

## 2017-06-24 DIAGNOSIS — Z7901 Long term (current) use of anticoagulants: Secondary | ICD-10-CM

## 2017-06-24 DIAGNOSIS — B348 Other viral infections of unspecified site: Secondary | ICD-10-CM

## 2017-06-24 DIAGNOSIS — J9621 Acute and chronic respiratory failure with hypoxia: Secondary | ICD-10-CM | POA: Diagnosis present

## 2017-06-24 DIAGNOSIS — I130 Hypertensive heart and chronic kidney disease with heart failure and stage 1 through stage 4 chronic kidney disease, or unspecified chronic kidney disease: Secondary | ICD-10-CM | POA: Diagnosis present

## 2017-06-24 DIAGNOSIS — J441 Chronic obstructive pulmonary disease with (acute) exacerbation: Secondary | ICD-10-CM | POA: Diagnosis present

## 2017-06-24 DIAGNOSIS — Z882 Allergy status to sulfonamides status: Secondary | ICD-10-CM

## 2017-06-24 DIAGNOSIS — N183 Chronic kidney disease, stage 3 unspecified: Secondary | ICD-10-CM | POA: Diagnosis present

## 2017-06-24 DIAGNOSIS — E1122 Type 2 diabetes mellitus with diabetic chronic kidney disease: Secondary | ICD-10-CM | POA: Diagnosis present

## 2017-06-24 DIAGNOSIS — J189 Pneumonia, unspecified organism: Secondary | ICD-10-CM | POA: Diagnosis not present

## 2017-06-24 DIAGNOSIS — I10 Essential (primary) hypertension: Secondary | ICD-10-CM | POA: Diagnosis not present

## 2017-06-24 DIAGNOSIS — Z9119 Patient's noncompliance with other medical treatment and regimen: Secondary | ICD-10-CM

## 2017-06-24 DIAGNOSIS — I5032 Chronic diastolic (congestive) heart failure: Secondary | ICD-10-CM | POA: Diagnosis present

## 2017-06-24 DIAGNOSIS — J45901 Unspecified asthma with (acute) exacerbation: Secondary | ICD-10-CM | POA: Diagnosis present

## 2017-06-24 DIAGNOSIS — I4891 Unspecified atrial fibrillation: Secondary | ICD-10-CM

## 2017-06-24 DIAGNOSIS — G4733 Obstructive sleep apnea (adult) (pediatric): Secondary | ICD-10-CM | POA: Diagnosis present

## 2017-06-24 DIAGNOSIS — Z91048 Other nonmedicinal substance allergy status: Secondary | ICD-10-CM

## 2017-06-24 DIAGNOSIS — K219 Gastro-esophageal reflux disease without esophagitis: Secondary | ICD-10-CM | POA: Diagnosis present

## 2017-06-24 DIAGNOSIS — J9601 Acute respiratory failure with hypoxia: Secondary | ICD-10-CM

## 2017-06-24 DIAGNOSIS — R062 Wheezing: Secondary | ICD-10-CM

## 2017-06-24 DIAGNOSIS — E1140 Type 2 diabetes mellitus with diabetic neuropathy, unspecified: Secondary | ICD-10-CM | POA: Diagnosis present

## 2017-06-24 DIAGNOSIS — J440 Chronic obstructive pulmonary disease with acute lower respiratory infection: Secondary | ICD-10-CM | POA: Diagnosis present

## 2017-06-24 DIAGNOSIS — J449 Chronic obstructive pulmonary disease, unspecified: Secondary | ICD-10-CM | POA: Diagnosis present

## 2017-06-24 DIAGNOSIS — Z886 Allergy status to analgesic agent status: Secondary | ICD-10-CM | POA: Diagnosis not present

## 2017-06-24 DIAGNOSIS — M109 Gout, unspecified: Secondary | ICD-10-CM | POA: Diagnosis present

## 2017-06-24 DIAGNOSIS — R0602 Shortness of breath: Secondary | ICD-10-CM

## 2017-06-24 DIAGNOSIS — E1165 Type 2 diabetes mellitus with hyperglycemia: Secondary | ICD-10-CM | POA: Diagnosis present

## 2017-06-24 DIAGNOSIS — J4489 Other specified chronic obstructive pulmonary disease: Secondary | ICD-10-CM | POA: Diagnosis present

## 2017-06-24 DIAGNOSIS — E7800 Pure hypercholesterolemia, unspecified: Secondary | ICD-10-CM | POA: Diagnosis present

## 2017-06-24 DIAGNOSIS — Z79899 Other long term (current) drug therapy: Secondary | ICD-10-CM

## 2017-06-24 DIAGNOSIS — Z91040 Latex allergy status: Secondary | ICD-10-CM

## 2017-06-24 DIAGNOSIS — F329 Major depressive disorder, single episode, unspecified: Secondary | ICD-10-CM | POA: Diagnosis present

## 2017-06-24 DIAGNOSIS — Z6841 Body Mass Index (BMI) 40.0 and over, adult: Secondary | ICD-10-CM | POA: Diagnosis not present

## 2017-06-24 DIAGNOSIS — E119 Type 2 diabetes mellitus without complications: Secondary | ICD-10-CM

## 2017-06-24 DIAGNOSIS — Z794 Long term (current) use of insulin: Secondary | ICD-10-CM

## 2017-06-24 DIAGNOSIS — D631 Anemia in chronic kidney disease: Secondary | ICD-10-CM | POA: Diagnosis present

## 2017-06-24 DIAGNOSIS — R350 Frequency of micturition: Secondary | ICD-10-CM | POA: Diagnosis present

## 2017-06-24 LAB — CBC WITH DIFFERENTIAL/PLATELET
Basophils Absolute: 0 10*3/uL (ref 0.0–0.1)
Basophils Relative: 0 %
Eosinophils Absolute: 0.1 10*3/uL (ref 0.0–0.7)
Eosinophils Relative: 1 %
HCT: 33.7 % — ABNORMAL LOW (ref 36.0–46.0)
Hemoglobin: 10.9 g/dL — ABNORMAL LOW (ref 12.0–15.0)
LYMPHS ABS: 0.6 10*3/uL — AB (ref 0.7–4.0)
LYMPHS PCT: 8 %
MCH: 28.9 pg (ref 26.0–34.0)
MCHC: 32.3 g/dL (ref 30.0–36.0)
MCV: 89.4 fL (ref 78.0–100.0)
MONO ABS: 0.2 10*3/uL (ref 0.1–1.0)
MONOS PCT: 2 %
Neutro Abs: 6.8 10*3/uL (ref 1.7–7.7)
Neutrophils Relative %: 89 %
PLATELETS: 255 10*3/uL (ref 150–400)
RBC: 3.77 MIL/uL — AB (ref 3.87–5.11)
RDW: 14.1 % (ref 11.5–15.5)
WBC: 7.7 10*3/uL (ref 4.0–10.5)

## 2017-06-24 LAB — COMPREHENSIVE METABOLIC PANEL
ALT: 20 U/L (ref 14–54)
ANION GAP: 11 (ref 5–15)
AST: 19 U/L (ref 15–41)
Albumin: 3.7 g/dL (ref 3.5–5.0)
Alkaline Phosphatase: 81 U/L (ref 38–126)
BUN: 27 mg/dL — ABNORMAL HIGH (ref 6–20)
CHLORIDE: 99 mmol/L — AB (ref 101–111)
CO2: 25 mmol/L (ref 22–32)
CREATININE: 0.98 mg/dL (ref 0.44–1.00)
Calcium: 9 mg/dL (ref 8.9–10.3)
GFR, EST NON AFRICAN AMERICAN: 55 mL/min — AB (ref >60)
Glucose, Bld: 265 mg/dL — ABNORMAL HIGH (ref 65–99)
Potassium: 4.5 mmol/L (ref 3.5–5.1)
Sodium: 135 mmol/L (ref 135–145)
TOTAL PROTEIN: 6.9 g/dL (ref 6.5–8.1)
Total Bilirubin: 0.5 mg/dL (ref 0.3–1.2)

## 2017-06-24 LAB — BLOOD GAS, VENOUS
ACID-BASE EXCESS: 1.8 mmol/L (ref 0.0–2.0)
BICARBONATE: 26 mmol/L (ref 20.0–28.0)
O2 Saturation: 87.6 %
PH VEN: 7.417 (ref 7.250–7.430)
Patient temperature: 98.6
pCO2, Ven: 41.1 mmHg — ABNORMAL LOW (ref 44.0–60.0)
pO2, Ven: 56.8 mmHg — ABNORMAL HIGH (ref 32.0–45.0)

## 2017-06-24 LAB — GLUCOSE, CAPILLARY: GLUCOSE-CAPILLARY: 514 mg/dL — AB (ref 65–99)

## 2017-06-24 LAB — I-STAT TROPONIN, ED: TROPONIN I, POC: 0 ng/mL (ref 0.00–0.08)

## 2017-06-24 LAB — TSH: TSH: 1.158 u[IU]/mL (ref 0.350–4.500)

## 2017-06-24 LAB — BRAIN NATRIURETIC PEPTIDE: B Natriuretic Peptide: 124 pg/mL — ABNORMAL HIGH (ref 0.0–100.0)

## 2017-06-24 MED ORDER — PANTOPRAZOLE SODIUM 40 MG PO TBEC
40.0000 mg | DELAYED_RELEASE_TABLET | Freq: Every day | ORAL | Status: DC
  Administered 2017-06-25 – 2017-06-27 (×3): 40 mg via ORAL
  Filled 2017-06-24 (×3): qty 1

## 2017-06-24 MED ORDER — IPRATROPIUM BROMIDE 0.02 % IN SOLN
0.5000 mg | Freq: Once | RESPIRATORY_TRACT | Status: AC
  Administered 2017-06-24: 0.5 mg via RESPIRATORY_TRACT
  Filled 2017-06-24: qty 2.5

## 2017-06-24 MED ORDER — INSULIN ASPART 100 UNIT/ML ~~LOC~~ SOLN
0.0000 [IU] | Freq: Every day | SUBCUTANEOUS | Status: DC
  Administered 2017-06-24: 5 [IU] via SUBCUTANEOUS
  Administered 2017-06-25 – 2017-06-26 (×2): 3 [IU] via SUBCUTANEOUS

## 2017-06-24 MED ORDER — TRAMADOL HCL 50 MG PO TABS
50.0000 mg | ORAL_TABLET | Freq: Two times a day (BID) | ORAL | Status: DC
  Administered 2017-06-24 – 2017-06-27 (×6): 50 mg via ORAL
  Filled 2017-06-24 (×6): qty 1

## 2017-06-24 MED ORDER — LOSARTAN POTASSIUM 50 MG PO TABS
100.0000 mg | ORAL_TABLET | Freq: Every day | ORAL | Status: DC
  Administered 2017-06-25 – 2017-06-27 (×3): 100 mg via ORAL
  Filled 2017-06-24 (×3): qty 2

## 2017-06-24 MED ORDER — ACETAMINOPHEN 325 MG PO TABS: 650.0000 mg | ORAL_TABLET | Freq: Four times a day (QID) | ORAL | Status: DC | PRN

## 2017-06-24 MED ORDER — LEVALBUTEROL HCL 0.63 MG/3ML IN NEBU
0.6300 mg | INHALATION_SOLUTION | Freq: Four times a day (QID) | RESPIRATORY_TRACT | Status: DC
  Administered 2017-06-24 – 2017-06-25 (×4): 0.63 mg via RESPIRATORY_TRACT
  Filled 2017-06-24 (×4): qty 3

## 2017-06-24 MED ORDER — ACETAMINOPHEN 650 MG RE SUPP: 650.0000 mg | Freq: Four times a day (QID) | RECTAL | Status: DC | PRN

## 2017-06-24 MED ORDER — INSULIN ASPART 100 UNIT/ML ~~LOC~~ SOLN
0.0000 [IU] | Freq: Three times a day (TID) | SUBCUTANEOUS | Status: DC
  Administered 2017-06-25 – 2017-06-26 (×4): 11 [IU] via SUBCUTANEOUS
  Administered 2017-06-26: 8 [IU] via SUBCUTANEOUS
  Administered 2017-06-26 – 2017-06-27 (×2): 11 [IU] via SUBCUTANEOUS

## 2017-06-24 MED ORDER — CEFTRIAXONE SODIUM 1 G IJ SOLR
1.0000 g | Freq: Once | INTRAMUSCULAR | Status: AC
  Administered 2017-06-24: 1 g via INTRAVENOUS
  Filled 2017-06-24: qty 10

## 2017-06-24 MED ORDER — BUDESONIDE 0.25 MG/2ML IN SUSP: 0.2500 mg | Freq: Two times a day (BID) | RESPIRATORY_TRACT | Status: DC

## 2017-06-24 MED ORDER — BIOTENE DRY MOUTH MT LIQD: 15.0000 mL | OROMUCOSAL | Status: DC | PRN

## 2017-06-24 MED ORDER — IPRATROPIUM BROMIDE 0.02 % IN SOLN: 0.5000 mg | Freq: Four times a day (QID) | RESPIRATORY_TRACT | Status: DC

## 2017-06-24 MED ORDER — ALBUTEROL (5 MG/ML) CONTINUOUS INHALATION SOLN
15.0000 mg/h | INHALATION_SOLUTION | RESPIRATORY_TRACT | Status: AC
  Administered 2017-06-24: 15 mg/h via RESPIRATORY_TRACT
  Filled 2017-06-24: qty 20

## 2017-06-24 MED ORDER — LORATADINE 10 MG PO TABS
10.0000 mg | ORAL_TABLET | Freq: Every morning | ORAL | Status: DC
  Administered 2017-06-25 – 2017-06-27 (×3): 10 mg via ORAL
  Filled 2017-06-24 (×3): qty 1

## 2017-06-24 MED ORDER — SODIUM CHLORIDE 0.9 % IV SOLN
100.0000 mg | Freq: Two times a day (BID) | INTRAVENOUS | Status: DC
  Administered 2017-06-25 – 2017-06-27 (×5): 100 mg via INTRAVENOUS
  Filled 2017-06-24 (×5): qty 100

## 2017-06-24 MED ORDER — APIXABAN 5 MG PO TABS
5.0000 mg | ORAL_TABLET | Freq: Two times a day (BID) | ORAL | Status: DC
  Administered 2017-06-24 – 2017-06-27 (×6): 5 mg via ORAL
  Filled 2017-06-24 (×6): qty 1

## 2017-06-24 MED ORDER — MONTELUKAST SODIUM 10 MG PO TABS
10.0000 mg | ORAL_TABLET | Freq: Every day | ORAL | Status: DC
  Administered 2017-06-24 – 2017-06-26 (×3): 10 mg via ORAL
  Filled 2017-06-24 (×3): qty 1

## 2017-06-24 MED ORDER — IPRATROPIUM BROMIDE 0.02 % IN SOLN
0.5000 mg | Freq: Four times a day (QID) | RESPIRATORY_TRACT | Status: DC
  Administered 2017-06-24 – 2017-06-25 (×4): 0.5 mg via RESPIRATORY_TRACT
  Filled 2017-06-24 (×4): qty 2.5

## 2017-06-24 MED ORDER — TRAMADOL HCL 50 MG PO TABS: 50.0000 mg | ORAL_TABLET | Freq: Every day | ORAL | Status: DC | PRN

## 2017-06-24 MED ORDER — ARFORMOTEROL TARTRATE 15 MCG/2ML IN NEBU: 15.0000 ug | INHALATION_SOLUTION | Freq: Two times a day (BID) | RESPIRATORY_TRACT | Status: DC

## 2017-06-24 MED ORDER — GUAIFENESIN ER 600 MG PO TB12
1200.0000 mg | ORAL_TABLET | Freq: Two times a day (BID) | ORAL | Status: DC
  Administered 2017-06-24 – 2017-06-27 (×6): 1200 mg via ORAL
  Filled 2017-06-24 (×6): qty 2

## 2017-06-24 MED ORDER — VERAPAMIL HCL 2.5 MG/ML IV SOLN
2.5000 mg | Freq: Once | INTRAVENOUS | Status: AC
  Administered 2017-06-24: 2.5 mg via INTRAVENOUS
  Filled 2017-06-24: qty 2

## 2017-06-24 MED ORDER — POLYETHYLENE GLYCOL 3350 17 G PO PACK: 17.0000 g | PACK | Freq: Every day | ORAL | Status: DC | PRN

## 2017-06-24 MED ORDER — GABAPENTIN 600 MG PO TABS: 600.0000 mg | ORAL_TABLET | Freq: Two times a day (BID) | ORAL | Status: DC

## 2017-06-24 MED ORDER — INSULIN ASPART 100 UNIT/ML ~~LOC~~ SOLN
15.0000 [IU] | Freq: Once | SUBCUTANEOUS | Status: AC
  Administered 2017-06-24: 15 [IU] via SUBCUTANEOUS

## 2017-06-24 MED ORDER — FUROSEMIDE 40 MG PO TABS
60.0000 mg | ORAL_TABLET | Freq: Every day | ORAL | Status: DC
  Administered 2017-06-25 – 2017-06-27 (×3): 60 mg via ORAL
  Filled 2017-06-24 (×3): qty 1

## 2017-06-24 MED ORDER — ONDANSETRON HCL 4 MG PO TABS: 4.0000 mg | ORAL_TABLET | Freq: Four times a day (QID) | ORAL | Status: DC | PRN

## 2017-06-24 MED ORDER — HYDROCOD POLST-CPM POLST ER 10-8 MG/5ML PO SUER
5.0000 mL | Freq: Once | ORAL | Status: AC
  Administered 2017-06-24: 5 mL via ORAL
  Filled 2017-06-24: qty 5

## 2017-06-24 MED ORDER — GABAPENTIN 400 MG PO CAPS
1200.0000 mg | ORAL_CAPSULE | Freq: Every day | ORAL | Status: DC
  Administered 2017-06-24 – 2017-06-26 (×3): 1200 mg via ORAL
  Filled 2017-06-24 (×3): qty 3

## 2017-06-24 MED ORDER — FESOTERODINE FUMARATE ER 4 MG PO TB24
4.0000 mg | ORAL_TABLET | Freq: Every day | ORAL | Status: DC
  Administered 2017-06-25 – 2017-06-27 (×3): 4 mg via ORAL
  Filled 2017-06-24 (×3): qty 1

## 2017-06-24 MED ORDER — MUSCLE RUB 10-15 % EX CREA
TOPICAL_CREAM | CUTANEOUS | Status: DC | PRN
Start: 2017-06-24 — End: 2017-06-27
  Administered 2017-06-24: 22:00:00 via TOPICAL
  Filled 2017-06-24: qty 85

## 2017-06-24 MED ORDER — ATORVASTATIN CALCIUM 10 MG PO TABS
10.0000 mg | ORAL_TABLET | Freq: Every day | ORAL | Status: DC
  Administered 2017-06-25 – 2017-06-26 (×2): 10 mg via ORAL
  Filled 2017-06-24 (×3): qty 1

## 2017-06-24 MED ORDER — SENNA 8.6 MG PO TABS
1.0000 | ORAL_TABLET | Freq: Two times a day (BID) | ORAL | Status: DC
  Administered 2017-06-24 – 2017-06-27 (×5): 8.6 mg via ORAL
  Filled 2017-06-24 (×6): qty 1

## 2017-06-24 MED ORDER — BUDESONIDE 0.25 MG/2ML IN SUSP
0.2500 mg | Freq: Two times a day (BID) | RESPIRATORY_TRACT | Status: DC
  Administered 2017-06-24 – 2017-06-27 (×6): 0.25 mg via RESPIRATORY_TRACT
  Filled 2017-06-24 (×6): qty 2

## 2017-06-24 MED ORDER — GABAPENTIN 300 MG PO CAPS
600.0000 mg | ORAL_CAPSULE | Freq: Every day | ORAL | Status: DC
Start: 2017-06-24 — End: 2017-06-27
  Administered 2017-06-25 – 2017-06-27 (×3): 600 mg via ORAL
  Filled 2017-06-24 (×3): qty 2

## 2017-06-24 MED ORDER — AZITHROMYCIN 250 MG PO TABS
500.0000 mg | ORAL_TABLET | Freq: Once | ORAL | Status: AC
  Administered 2017-06-24: 500 mg via ORAL
  Filled 2017-06-24: qty 2

## 2017-06-24 MED ORDER — ARFORMOTEROL TARTRATE 15 MCG/2ML IN NEBU
15.0000 ug | INHALATION_SOLUTION | Freq: Two times a day (BID) | RESPIRATORY_TRACT | Status: DC
  Administered 2017-06-25: 15 ug via RESPIRATORY_TRACT
  Filled 2017-06-24: qty 2

## 2017-06-24 MED ORDER — METHYLPREDNISOLONE SODIUM SUCC 125 MG IJ SOLR
60.0000 mg | Freq: Four times a day (QID) | INTRAMUSCULAR | Status: DC
  Administered 2017-06-24 – 2017-06-26 (×7): 60 mg via INTRAVENOUS
  Filled 2017-06-24 (×7): qty 2

## 2017-06-24 MED ORDER — GUAIFENESIN ER 600 MG PO TB12: 1200.0000 mg | ORAL_TABLET | Freq: Two times a day (BID) | ORAL | Status: DC

## 2017-06-24 MED ORDER — LEVALBUTEROL HCL 0.63 MG/3ML IN NEBU: 0.6300 mg | INHALATION_SOLUTION | Freq: Four times a day (QID) | RESPIRATORY_TRACT | Status: DC

## 2017-06-24 MED ORDER — ONDANSETRON HCL 4 MG/2ML IJ SOLN: 4.0000 mg | Freq: Four times a day (QID) | INTRAMUSCULAR | Status: DC | PRN

## 2017-06-24 MED ORDER — CITALOPRAM HYDROBROMIDE 20 MG PO TABS
20.0000 mg | ORAL_TABLET | Freq: Every day | ORAL | Status: DC
  Administered 2017-06-25 – 2017-06-27 (×3): 20 mg via ORAL
  Filled 2017-06-24 (×3): qty 1

## 2017-06-24 MED ORDER — FUROSEMIDE 10 MG/ML IJ SOLN
40.0000 mg | Freq: Once | INTRAMUSCULAR | Status: AC
  Administered 2017-06-24: 40 mg via INTRAVENOUS
  Filled 2017-06-24: qty 4

## 2017-06-24 MED ORDER — POTASSIUM CHLORIDE CRYS ER 10 MEQ PO TBCR
10.0000 meq | EXTENDED_RELEASE_TABLET | Freq: Every day | ORAL | Status: DC
Start: 2017-06-25 — End: 2017-06-27
  Administered 2017-06-25 – 2017-06-27 (×3): 10 meq via ORAL
  Filled 2017-06-24 (×3): qty 1

## 2017-06-24 MED ORDER — FUROSEMIDE 20 MG PO TABS
20.0000 mg | ORAL_TABLET | Freq: Every day | ORAL | Status: DC
Start: 2017-06-24 — End: 2017-06-24

## 2017-06-24 MED ORDER — DILTIAZEM HCL ER COATED BEADS 240 MG PO CP24
240.0000 mg | ORAL_CAPSULE | Freq: Every day | ORAL | Status: DC
  Administered 2017-06-25 – 2017-06-27 (×3): 240 mg via ORAL
  Filled 2017-06-24 (×3): qty 1

## 2017-06-24 NOTE — ED Notes (Signed)
Notified RT that patient has an order for continuous neb and a run a venous blood gas.

## 2017-06-24 NOTE — ED Notes (Signed)
Pt's linens changed. Pt provided meal tray

## 2017-06-24 NOTE — H&P (Signed)
History and Physical    Elaine Lee ZOX:[id number] DOB: 10/17/1945 DOA: 06/24/2017  PCP: Jethro Bastos, MD   Patient coming from: Home via Physician's office  Chief Complaint: Worsening SOB  HPI: Elaine Lee is a 72 y.o. female with medical history significant of hypertension, hyperlipidemia, diastolic CHF, CKD stage III, type II days mellitus on Victoza, obstructive sleep apnea, atrial fibrillation on apixaban, history of asthm and other comorbidities who presents with worsening shortness of breath progressively got worse over the last week.  She states that shortness of breath is worse in the last 3 days and started to have a productive sputum along with a sore throat.  Patient was using her home nebulizer without much relief.  Patient also had diarrhea which is resolved now after she is taking the Imodium.  She states that she wants to breathe better and has been nauseous but has not had anything to eat.  States that she coughs so much she coughed her dentures out of her mouth.  States that her sputum used to be clear in color however changed to yellow a few days ago.  She went to go to be evaluated [facility] who gave her IM Solu-Medrol, albuterol, Atrovent, and magnesium but because she remained hypoxic she was referred to the emergency room for evaluation.  Tried hospitalist was called to admit this patient for acute respiratory failure with hypoxia along with a COPD exacerbation.  ED Course: Had basic blood work done, was given continuous albuterol nebs, had a chest x-ray as well as had antibiotics administered.  Because of her continuous neb she became tachycardic and was then given verapamil.  Respiratory virus panel was ordered at my request and she was placed on empiric droplet precautions.  Review of Systems: As per HPI otherwise 10 point review of systems negative.   Past Medical History:  Diagnosis Date  . A-fib (HCC)   . Asthma   . CHF  (congestive heart failure) (HCC)   . Chronic edema   . CKD (chronic kidney disease), stage III (HCC)   . COPD (chronic obstructive pulmonary disease) (HCC)   . Degenerative lumbar disc   . Depression   . Diabetes mellitus without complication (HCC)   . Diabetic neuropathy (HCC)   . GERD (gastroesophageal reflux disease)   . Gout   . Hearing loss   . Hypercholesteremia   . Hypertension   . Insomnia   . Obstructive sleep apnea on CPAP   . Osteoarthritis    knees and shoulders  . Renal disorder   . Urinary frequency     Past Surgical History:  Procedure Laterality Date  . ABDOMINAL HYSTERECTOMY     SOCIAL HISTORY  reports that she has never smoked. She has never used smokeless tobacco. She reports that she does not drink alcohol. Her drug history is not on file.  Allergies  Allergen Reactions  . Other Shortness Of Breath    ROSEMARY AND HEAVY SCENTED PERFUMES, DETERGENTS, ETC  . Tape Itching and Rash    Burns and blisters USE PAPER TAPE  . Celebrex [Celecoxib] Other (See Comments)    HARD TIME HEARING, LIKE BEING UNDERWATER  . Latex Hives, Itching and Rash    Other reaction(s): RASH  . Mysoline [Primidone] Other (See Comments)    unknown  . Niacin And Related Hives and Swelling  . Sulfa Antibiotics Other (See Comments)    TONGUE BLISTERS    . Aspirin Other (See  Comments)    GI PROBLEMS AND PAIN  . Trazodone And Nefazodone Swelling   Family History  Problem Relation Age of Onset  . Allergies Mother   . Rheumatologic disease Mother   . Cancer Mother   . Allergies Father   . Asthma Father   . Heart disease Father   . Rheumatologic disease Father   . Cancer Father   . Allergies Daughter   . Cancer Sister   . Breast cancer Maternal Aunt   . Breast cancer Paternal Aunt   . Breast cancer Maternal Aunt    Prior to Admission medications   Medication Sig Start Date End Date Taking? Authorizing Provider  acetaminophen (TYLENOL) 325 MG tablet Take 2 tablets (650 mg  total) by mouth every 8 (eight) hours as needed. 04/06/16  Yes Althia Forts, MD  albuterol (PROVENTIL HFA;VENTOLIN HFA) 108 (90 BASE) MCG/ACT inhaler Inhale 2 puffs into the lungs every 6 (six) hours as needed for wheezing or shortness of breath.   Yes [provider]  antiseptic oral rinse (BIOTENE) LIQD 15 mLs by Mouth Rinse route every 2 (two) hours as needed for dry mouth.    Yes [provider]  apixaban (ELIQUIS) 5 MG TABS tablet Take 5 mg by mouth 2 (two) times daily.   Yes [provider]  atorvastatin (LIPITOR) 10 MG tablet Take 10 mg by mouth daily.   Yes [provider]  budesonide-formoterol (SYMBICORT) 160-4.5 MCG/ACT inhaler Inhale 2 puffs into the lungs 2 (two) times daily.   Yes [provider]  citalopram (CELEXA) 20 MG tablet Take 20 mg by mouth daily.   Yes [provider]  dextromethorphan-guaiFENesin (SILTUSSIN DM DAS) 10-100 MG/5ML liquid Take 5 mLs by mouth every 4 (four) hours as needed for cough.   Yes [provider]  diltiazem (CARDIZEM CD) 240 MG 24 hr capsule Take 240 mg by mouth daily.   Yes [provider]  fesoterodine (TOVIAZ) 4 MG TB24 tablet Take 4 mg by mouth daily.    Yes [provider]  furosemide (LASIX) 40 MG tablet Take 60 mg by mouth every morning.    Yes [provider]  gabapentin (NEURONTIN) 600 MG tablet Take 1 tablet (600 mg total) by mouth 2 (two) times daily. Patient taking differently: Take 600-1,200 mg by mouth 2 (two) times daily. Take 1 tablet in the am and Take 2 tablets in the evening. 06/16/14  Yes Moding, Adrian Blackwater, MD  guaiFENesin (MUCINEX) 600 MG 12 hr tablet Take by mouth 2 (two) times daily as needed for cough.   Yes [provider]  ipratropium-albuterol (DUONEB) 0.5-2.5 (3) MG/3ML SOLN Take 3 mLs by nebulization 4 (four) times daily.   Yes [provider]  liraglutide (VICTOZA) 18 MG/3ML SOPN Inject 1.2 mg into the skin daily.    Yes  [provider]  loratadine (CLARITIN) 10 MG tablet Take 10 mg by mouth every morning.   Yes [provider]  losartan (COZAAR) 100 MG tablet Take 100 mg by mouth daily.   Yes [provider]  Menthol, Topical Analgesic, (BIOFREEZE ROLL-ON) 4 % GEL Apply 1 application topically 2 (two) times daily as needed (for joint pain).   Yes [provider]  montelukast (SINGULAIR) 10 MG tablet Take 1 tablet (10 mg total) by mouth at bedtime. 09/27/14  Yes Coralyn Helling, MD  nystatin (NYSTATIN) powder Apply 1 Bottle topically 4 (four) times daily as needed (skin folds).   Yes [provider]  omeprazole (PRILOSEC) 20 MG capsule Take 20 mg by mouth daily.   Yes [provider]  OXYGEN Inhale 2-3 L into the lungs daily as needed (bedtime).    Yes [provider]  polyethylene glycol (MIRALAX / GLYCOLAX) packet Take 17 g by mouth daily. Patient taking differently: Take 17 g by mouth daily as needed for moderate constipation.  06/16/14  Yes Moding, Adrian Blackwater, MD  potassium chloride (K-DUR,KLOR-CON) 10 MEQ tablet Take 10 mEq by mouth daily.   Yes [provider]  simethicone (MYLICON) 125 MG chewable tablet Chew 125 mg by mouth every 6 (six) hours as needed for flatulence.   Yes [provider]  tetrahydrozoline (VISINE) 0.05 % ophthalmic solution Place 1 drop into both eyes 4 (four) times daily as needed (eye irritation).   Yes [provider]  traMADol (ULTRAM) 50 MG tablet Take 50 mg by mouth 2 (two) times daily.    Yes [provider]  traMADol (ULTRAM) 50 MG tablet Take 50 mg by mouth daily as needed (midday pain).   Yes [provider]  cephALEXin (KEFLEX) 500 MG capsule Take 1 capsule (500 mg total) by mouth every 12 (twelve) hours. Until 4/10 Patient not taking: Reported on 06/24/2017 04/06/16   Althia Forts, MD  furosemide (LASIX) 20 MG tablet Take 20 mg by mouth daily.    [provider]    insulin aspart protamine- aspart (NOVOLOG MIX 70/30) (70-30) 100 UNIT/ML injection Inject 0.1 mLs (10 Units total) into the skin 2 (two) times daily with a meal. Takes 25 units in am and 14 units in pm Patient not taking: Reported on 06/24/2017 04/06/16   Althia Forts, MD   Physical Exam: Vitals:   06/24/17 1600 06/24/17 1700 06/24/17 1730 06/24/17 1800  BP: (!) 149/88 (!) 186/76 (!) 169/82 (!) 175/130  Pulse: (!) 150 (!) 144 (!) 108 (!) 101  Resp: 20 20 19  (!) 21  Temp:      TempSrc:      SpO2: 92% (!) 87% 90% 90%  Weight:      Height:       Constitutional: WN/WD morbidly obese Caucasian female in some Respiratory distress and is diffusely and audibly wheezing Eyes: Lids and conjunctivae normal, sclerae anicteric  ENMT: External Ears, Nose appear normal. Grossly normal hearing. Mucous membranes are moist.  Neck: Appears normal, supple, no cervical masses, normal ROM, no appreciable thyromegaly, no JVD Respiratory: Diminished to auscultation bilaterally with significant wheezing and coarse breath sounds. No appreciable rales or crackles Slightly increased respiratory effort but No accessory muscle use. Wearing Supplemental O2 via  Cardiovascular: Irregularly Irregular, no murmurs / rubs / gallops. S1 and S2 auscultated. Trace LE extremity edema.  Abdomen: Soft, non-tender, Distended due to body habitus. No masses palpated. No appreciable hepatosplenomegaly. Bowel sounds positive x4.  GU: Deferred. Musculoskeletal: No clubbing / cyanosis of digits/nails. No joint deformity upper and lower extremities. Skin: No rashes, lesions, ulcers on a limited skin evaluation. No induration; Warm and dry.  Neurologic: CN 2-12 grossly intact with no focal deficits. Romberg sign and cerebellar reflexes not assessed.  Psychiatric: Normal judgment and insight. Alert and oriented x 3. Normal mood and appropriate affect.   Labs on Admission: I have personally reviewed following labs and imaging  studies  CBC: Recent Labs  Lab 06/24/17 1529  WBC 7.7  NEUTROABS 6.8  HGB 10.9*  HCT 33.7*  MCV 89.4  PLT 255   Basic Metabolic Panel: Recent Labs  Lab 06/24/17 1529  NA 135  K 4.5  CL 99*  CO2 25  GLUCOSE 265*  BUN 27*  CREATININE 0.98  CALCIUM 9.0   GFR: Estimated Creatinine Clearance: 69 mL/min (by C-G formula based on SCr of 0.98 mg/dL). Liver Function Tests: Recent Labs  Lab 06/24/17 1529  AST 19  ALT 20  ALKPHOS 81  BILITOT 0.5  PROT 6.9  ALBUMIN 3.7   No results for input(s): LIPASE, AMYLASE in the last 168 hours. No results for input(s): AMMONIA in the last 168 hours. Coagulation Profile: No results for input(s): INR, PROTIME in the last 168 hours. Cardiac Enzymes: No results for input(s): CKTOTAL, CKMB, CKMBINDEX, TROPONINI in the last 168 hours. BNP (last 3 results) No results for input(s): PROBNP in the last 8760 hours. HbA1C: No results for input(s): HGBA1C in the last 72 hours. CBG: No results for input(s): GLUCAP in the last 168 hours. Lipid Profile: No results for input(s): CHOL, HDL, LDLCALC, TRIG, CHOLHDL, LDLDIRECT in the last 72 hours. Thyroid Function Tests: No results for input(s): TSH, T4TOTAL, FREET4, T3FREE, THYROIDAB in the last 72 hours. Anemia Panel: No results for input(s): VITAMINB12, FOLATE, FERRITIN, TIBC, IRON, RETICCTPCT in the last 72 hours. Urine analysis:    Component Value Date/Time   COLORURINE AMBER (A) 04/04/2016 1627   APPEARANCEUR CLOUDY (A) 04/04/2016 1627   LABSPEC 1.017 04/04/2016 1627   PHURINE 8.0 04/04/2016 1627   GLUCOSEU NEGATIVE 04/04/2016 1627   HGBUR NEGATIVE 04/04/2016 1627   BILIRUBINUR NEGATIVE 04/04/2016 1627   KETONESUR NEGATIVE 04/04/2016 1627   PROTEINUR 30 (A) 04/04/2016 1627   UROBILINOGEN 0.2 06/14/2014 0636   NITRITE POSITIVE (A) 04/04/2016 1627   LEUKOCYTESUR NEGATIVE 04/04/2016 1627   Sepsis Labs:  !!!!!!!!!!!!!!!!!!!!!!!!!!!!!!!!!!!!!!!!!!!! @LABRCNTIP (procalcitonin:4,lacticidven:4) )No results found for this or any previous visit (from the past 240 hour(s)).   Radiological Exams on Admission: Dg Chest 2 View  Result Date: 06/24/2017 CLINICAL DATA:  Shortness of breath for 3 days. EXAM: CHEST - 2 VIEW COMPARISON:  04/04/2016. FINDINGS: Cardiomegaly. Mild vascular congestion. Slight retrocardiac density could represent atelectasis or developing consolidation. Small LEFT effusion. No pneumothorax. Aortic atherosclerosis. Bones unremarkable. IMPRESSION: Cardiomegaly. Slight retrocardiac density could represent atelectasis or developing consolidation. Electronically Signed   By: Elsie Stain M.D.   On: 06/24/2017 17:07    EKG: Independently reviewed. Showed Atrial Fibriallation with a tachycardic rate of approximately 120. No evidence of ST elevation.   Assessment/Plan Active Problems:   COPD (chronic obstructive pulmonary disease) (HCC)  Acute on chronic respiratory failure with hypoxia secondary to COPD exacerbation -Patient previously used to wear 2 to 3 L of oxygen nightly but no longer wears oxygen per patient  -Patient was found to have a O2 saturation of 87% in the ED and was saturating 88 to 90% on 2 L so she was increased to 4 L -Chest x-ray showed retro-cardiac density concerning for atelectasis versus early pneumonia -Patient is diffusely wheezing on examination and has had increasing shortness of breath with productive sputum over the last week -Continue supplemental oxygen via nasal cannula and wean as tolerated -Continuous pulse oximetry and maintain O2 saturations greater than 90% -We will give 1 dose of IV Lasix 40 mg at this time -Repeat chest x-ray in the a.m. -If not improving or significantly worsening will obtain CT of the chest to evaluate and consider pulmonary evaluation.  Patient sees Dr. Craige Cotta in outpatient setting  Acute exacerbation of COPD/Asthma   -Diffusely wheezing on examination -Continue supplemental oxygen via nasal cannula and wean as  tolerated -Continuous pulse oximetry and maintain O2 saturations greater than 90% -We will start doxycycline IV 100 mg twice daily -Continue with Xopenex/Atrovent every 6 scheduled and also start scheduled Brovana and Budesonide -Was given IM Solu-Medrol patient's primary care physician's office.  We will start the patient on scheduled Solu-Medrol 60 mg every 6h  -Guaifenesin 1200 mg p.o. twice daily, flutter valve, incentive spirometer -Also adding pulmonary toilet -Continue montelukast 10 mg p.o. nightly  Atrial Fibrillation -Has mild RVR and likely was worsened in the setting of albuterol administration -Given 2.5 mg of verapamil in the ED -Continue with home diltiazem 240 mill grams p.o. Daily -Continue with anticoagulation with apixaban 5 mg p.o. twice daily  Chronic Diastolic CHF -Currently not in exacerbation -Continue home furosemide but in the interim we will give IV furosemide 40 mg to help with work of breathing -Checked BNP and was 124.0 -Strict I's and O's, daily weights -Continue with losartan 100 mg p.o. Daily  Obstructive sleep apnea -Patient has a CPAP at home but she is noncompliant with it -We will order CPAP while hospitalized  CKD stage III -BUN/Creatinine Stable on Admission at 27/0.98 -BUN likely elevated in the setting of Steroid Administration -Continue to Monitor  GERD -Continue with pantoprazole 40 mg p.o. daily as a substitution for omeprazole 20 mg  Hypercholesterolemia/Hyperlipidemia -Continue with Atorvastatin 10 mg p.o. nightly  Depression -Continue with Citalopram 20 mg p.o. daily  Urinary frequency -Continue with Fesoterodine 4 mg daily  Diabetes mellitus type 2 -Patient only takes Victoza at home now -Continue to monitor CBGs and will place on a sensitive sliding scale insulin before meals and at bedtime -Check Hemoglobin A1c in  a.m. -Blood glucose on BMP was 265 on admission. -Expect blood sugars to be elevated in the setting of IV steroid use and will continue monitor very carefully  Normocytic anemia -Likely in the setting of chronic kidney disease -Continue to monitor for signs and symptoms of bleeding -We will check anemia panel in a.m. -Repeat CBC in the a.m.  DVT prophylaxis: Anticoagulated with Apixaban  Code Status: FULL CODE Family Communication: No Family Present at bedside  Disposition Plan: Anticipate D/C back to Home Setting and follow up with [facility]  Consults called: None Admission status: Inpatient Telemetry   Severity of Illness: The appropriate patient status for this patient is INPATIENT. Inpatient status is judged to be reasonable and necessary in order to provide the required intensity of service to ensure the patient's safety. The patient's presenting symptoms, physical exam findings, and initial radiographic and laboratory data in the context of their chronic comorbidities is felt to place them at high risk for further clinical deterioration. Furthermore, it is not anticipated that the patient will be medically stable for discharge from the hospital within 2 midnights of admission. The following factors support the patient status of inpatient.   " The patient's presenting symptoms include dyspnea, wheezing, productive sputum. " The worrisome physical exam findings include diffuse wheezing. " The initial radiographic and laboratory data are worrisome because of Retrocardiac Density and because of Hyperglycemia. " The chronic co-morbidities include hypertension, hyperlipidemia, diabetes mellitus type 2, atrial fibrillation, COPD, asthma, CKD stage III.  * I certify that at the point of admission it is my clinical judgment that the patient will require inpatient hospital care spanning beyond 2 midnights from the point of admission due to high intensity of service, high risk for further  deterioration and high frequency of surveillance required.*  Merlene Laughtermair Latif Sheikh,  D.O. [facility] Pager [phone]  If 7PM-7AM, please contact night-coverage [email or web address] Password [facility]  06/24/2017, 6:07 PM

## 2017-06-24 NOTE — ED Provider Notes (Signed)
[facility]-EMERGENCY DEPT Provider Note   CSN: [id number] Arrival date & time: 06/24/17  1416     History   Chief Complaint Chief Complaint  Patient presents with  . Shortness of Breath    HPI Elaine Lee is a 72 y.o. female.  [age] year old female with past medical history including atrial fibrillation on Eliquis, asthma, CHF, CKD, type 2 diabetes mellitus, OSA who presents with shortness of breath.  3 days ago, the patient began having shortness of breath associated with nonproductive cough, subjective fevers, nausea, nasal congestion, and sore throat.  She also had significant diarrhea initially that resolved after taking Imodium.  Her shortness of breath has progressively worsened despite using albuterol every 4 hours.  She called EMS and they noted hypoxia.  She received IM Solu-Medrol, albuterol, Atrovent, and magnesium prior to arrival.  She reports "rib pain" just when coughing but denies any chest pain.  She does not currently use supplemental oxygen at home.  The history is provided by the patient.  Shortness of Breath     Past Medical History:  Diagnosis Date  . A-fib (HCC)   . Asthma   . CHF (congestive heart failure) (HCC)   . Chronic edema   . CKD (chronic kidney disease), stage III (HCC)   . COPD (chronic obstructive pulmonary disease) (HCC)   . Degenerative lumbar disc   . Depression   . Diabetes mellitus without complication (HCC)   . Diabetic neuropathy (HCC)   . GERD (gastroesophageal reflux disease)   . Gout   . Hearing loss   . Hypercholesteremia   . Hypertension   . Insomnia   . Obstructive sleep apnea on CPAP   . Osteoarthritis    knees and shoulders  . Renal disorder   . Urinary frequency     Patient Active Problem List   Diagnosis Date Noted  . UTI (urinary tract infection) 04/05/2016  . Pleuritic chest pain 04/05/2016  . AKI (acute kidney injury) (HCC) 04/05/2016  . Tremor 04/05/2016  . Fall   . Asthma flare  10/25/2014  . OSA (obstructive sleep apnea) 10/25/2014  . Morbid obesity (HCC) 10/25/2014  . Acute on chronic respiratory failure with hypoxia (HCC)   . DM2 (diabetes mellitus, type 2) (HCC) 06/14/2014  . CKD (chronic kidney disease), stage III (HCC) 06/14/2014  . Neuropathic pain 06/14/2014  . HTN (hypertension) 06/14/2014  . Type 2 diabetes with stage 3 chronic kidney disease GFR 30-59 (HCC)   . COPD exacerbation (HCC) 06/13/2014  . Acute exacerbation of CHF (congestive heart failure) (HCC) 06/13/2014    Past Surgical History:  Procedure Laterality Date  . ABDOMINAL HYSTERECTOMY       OB History   None      Home Medications    Prior to Admission medications   Medication Sig Start Date End Date Taking? Authorizing Provider  acetaminophen (TYLENOL) 325 MG tablet Take 2 tablets (650 mg total) by mouth every 8 (eight) hours as needed. 04/06/16  Yes Althia FortsJohnson, Adam, MD  albuterol (PROVENTIL HFA;VENTOLIN HFA) 108 (90 BASE) MCG/ACT inhaler Inhale 2 puffs into the lungs every 6 (six) hours as needed for wheezing or shortness of breath.   Yes [provider]  antiseptic oral rinse (BIOTENE) LIQD 15 mLs by Mouth Rinse route every 2 (two) hours as needed for dry mouth.    Yes [provider]  apixaban (ELIQUIS) 5 MG TABS tablet Take 5 mg by mouth 2 (two) times daily.   Yes [provider]  atorvastatin (LIPITOR) 10 MG tablet Take 10 mg by mouth daily.   Yes [provider]  budesonide-formoterol (SYMBICORT) 160-4.5 MCG/ACT inhaler Inhale 2 puffs into the lungs 2 (two) times daily.   Yes [provider]  citalopram (CELEXA) 20 MG tablet Take 20 mg by mouth daily.   Yes [provider]  dextromethorphan-guaiFENesin (SILTUSSIN DM DAS) 10-100 MG/5ML liquid Take 5 mLs by mouth every 4 (four) hours as needed for cough.   Yes [provider]  diltiazem (CARDIZEM CD) 240 MG 24 hr capsule Take 240 mg by mouth daily.   Yes [provider]  fesoterodine (TOVIAZ) 4 MG TB24 tablet Take 4 mg by mouth daily.    Yes [provider]  furosemide (LASIX) 40 MG tablet Take 60 mg by mouth every morning.    Yes [provider]  gabapentin (NEURONTIN) 600 MG tablet Take 1 tablet (600 mg total) by mouth 2 (two) times daily. Patient taking differently: Take 600-1,200 mg by mouth 2 (two) times daily. Take 1 tablet in the am and Take 2 tablets in the evening. 06/16/14  Yes Moding, Adrian Blackwater, MD  guaiFENesin (MUCINEX) 600 MG 12 hr tablet Take by mouth 2 (two) times daily as needed for cough.   Yes [provider]  ipratropium-albuterol (DUONEB) 0.5-2.5 (3) MG/3ML SOLN Take 3 mLs by nebulization 4 (four) times daily.   Yes [provider]  liraglutide (VICTOZA) 18 MG/3ML SOPN Inject 1.2 mg into the skin daily.    Yes [provider]  loratadine (CLARITIN) 10 MG tablet Take 10 mg by mouth every morning.   Yes [provider]  losartan (COZAAR) 100 MG tablet Take 100 mg by mouth daily.   Yes [provider]  Menthol, Topical Analgesic, (BIOFREEZE ROLL-ON) 4 % GEL Apply 1 application topically 2 (two) times daily as needed (for joint pain).   Yes [provider]  montelukast (SINGULAIR) 10 MG tablet Take 1 tablet (10 mg total) by mouth at bedtime. 09/27/14  Yes Coralyn Helling, MD  nystatin (NYSTATIN) powder Apply 1 Bottle topically 4 (four) times daily as needed (skin folds).   Yes [provider]  omeprazole (PRILOSEC) 20 MG capsule Take 20 mg by mouth daily.   Yes [provider]  OXYGEN Inhale 2-3 L into the lungs daily as needed (bedtime).    Yes [provider]  polyethylene glycol (MIRALAX / GLYCOLAX) packet Take 17 g by mouth daily. Patient taking differently: Take 17 g by mouth daily as needed for moderate constipation.  06/16/14  Yes Moding, Adrian Blackwater, MD  potassium chloride (K-DUR,KLOR-CON) 10 MEQ tablet Take 10 mEq by mouth daily.   Yes  [provider]  simethicone (MYLICON) 125 MG chewable tablet Chew 125 mg by mouth every 6 (six) hours as needed for flatulence.   Yes [provider]  tetrahydrozoline (VISINE) 0.05 % ophthalmic solution Place 1 drop into both eyes 4 (four) times daily as needed (eye irritation).   Yes [provider]  traMADol (ULTRAM) 50 MG tablet Take 50 mg by mouth 2 (two) times daily.    Yes [provider]  traMADol (ULTRAM) 50 MG tablet Take 50 mg by mouth daily as needed (midday pain).   Yes [provider]  cephALEXin (KEFLEX) 500 MG capsule Take 1 capsule (500 mg total) by mouth every 12 (twelve) hours. Until 4/10 Patient not taking: Reported on 06/24/2017 04/06/16   Althia Forts, MD  furosemide (LASIX) 20  MG tablet Take 20 mg by mouth daily.    [provider]  insulin aspart protamine- aspart (NOVOLOG MIX 70/30) (70-30) 100 UNIT/ML injection Inject 0.1 mLs (10 Units total) into the skin 2 (two) times daily with a meal. Takes 25 units in am and 14 units in pm Patient not taking: Reported on 06/24/2017 04/06/16   Althia Forts, MD    Family History Family History  Problem Relation Age of Onset  . Allergies Mother   . Rheumatologic disease Mother   . Cancer Mother   . Allergies Father   . Asthma Father   . Heart disease Father   . Rheumatologic disease Father   . Cancer Father   . Allergies Daughter   . Cancer Sister   . Breast cancer Maternal Aunt   . Breast cancer Paternal Aunt   . Breast cancer Maternal Aunt     Social History Social History   Tobacco Use  . Smoking status: Never Smoker  . Smokeless tobacco: Never Used  Substance Use Topics  . Alcohol use: No    Alcohol/week: 0.0 oz  . Drug use: Not on file     Allergies   Other; Tape; Celebrex [celecoxib]; Latex; Mysoline [primidone]; Niacin and related; Sulfa antibiotics; Aspirin; and Trazodone and nefazodone   Review of Systems Review of Systems  Respiratory: Positive  for shortness of breath.    All other systems reviewed and are negative except that which was mentioned in HPI   Physical Exam Updated Vital Signs BP (!) 149/88   Pulse (!) 150   Temp 99.5 F (37.5 C) (Axillary)   Resp 20   Ht 5\' 5"  (1.651 m)   Wt 131.5 kg (290 lb)   SpO2 92%   BMI 48.26 kg/m   Physical Exam  Constitutional: She is oriented to person, place, and time. She appears well-developed and well-nourished. No distress.  HENT:  Head: Normocephalic and atraumatic.  Mouth/Throat: Oropharynx is clear and moist.  Moist mucous membranes  Eyes: Conjunctivae are normal.  Neck: Neck supple.  Cardiovascular: Normal rate and normal heart sounds. An irregularly irregular rhythm present.  No murmur heard. Pulmonary/Chest:  Mild dyspnea without respiratory distress, diffuse expiratory wheezes with diminished breath sounds bilaterally  Abdominal: Soft. Bowel sounds are normal. She exhibits no distension. There is no tenderness.  Musculoskeletal:       Right lower leg: She exhibits edema.       Left lower leg: She exhibits edema.  Trace BLE edema  Neurological: She is alert and oriented to person, place, and time.  Fluent speech  Skin: Skin is warm and dry.  Psychiatric: She has a normal mood and affect. Judgment normal.  Nursing note and vitals reviewed.    ED Treatments / Results  Labs (all labs ordered are listed, but only abnormal results are displayed) Labs Reviewed  CBC WITH DIFFERENTIAL/PLATELET - Abnormal; Notable for the following components:      Result Value   RBC 3.77 (*)    Hemoglobin 10.9 (*)    HCT 33.7 (*)    Lymphs Abs 0.6 (*)    All other components within normal limits  COMPREHENSIVE METABOLIC PANEL - Abnormal; Notable for the following components:   Chloride 99 (*)    Glucose, Bld 265 (*)    BUN 27 (*)    GFR calc non Af Amer 55 (*)    All other components within normal limits  BLOOD GAS, VENOUS - Abnormal; Notable for the following components:  pCO2, Ven 41.1 (*)    pO2, Ven 56.8 (*)    All other components within normal limits  I-STAT TROPONIN, ED    EKG None  Radiology No results found.  Procedures .Critical Care Performed by: Laurence Spates, MD Authorized by: Laurence Spates, MD   Critical care provider statement:    Critical care time (minutes):  30   Critical care time was exclusive of:  Separately billable procedures and treating other patients   Critical care was necessary to treat or prevent imminent or life-threatening deterioration of the following conditions:  Respiratory failure   Critical care was time spent personally by me on the following activities:  Blood draw for specimens, development of treatment plan with patient or surrogate, evaluation of patient's response to treatment, examination of patient, obtaining history from patient or surrogate, ordering and performing treatments and interventions, ordering and review of laboratory studies, ordering and review of radiographic studies, re-evaluation of patient's condition and review of old charts   (including critical care time)  Medications Ordered in ED Medications  albuterol (PROVENTIL,VENTOLIN) solution continuous neb (0 mg/hr Nebulization Stopped 06/24/17 1633)  ipratropium (ATROVENT) nebulizer solution 0.5 mg (0.5 mg Nebulization Given 06/24/17 1534)     Initial Impression / Assessment and Plan / ED Course  I have reviewed the triage vital signs and the nursing notes.  Pertinent labs & imaging results that were available during my care of the patient were reviewed by me and considered in my medical decision making (see chart for details).     She was 90% on 2L [place], no respiratory distress. Diffuse wheezing. Placed on continuous albuterol given persistent hypoxia.   Patient is on anticoagulation and states she is compliant with medication therefore PE is very unlikely.  Basic lab work and troponin reassuring.  Chest x-ray is  pending.  I am signing patient out to the oncoming provider, Dr. Lynelle Doctor, who will follow up on her clinical improvement and oxygen requirement after 1 hour of continuous albuterol.  If she remains hypoxic, she will need admission.  Final Clinical Impressions(s) / ED Diagnoses   Final diagnoses:  None    ED Discharge Orders    None       Little, Ambrose Finland, MD 06/24/17 [phone]

## 2017-06-24 NOTE — ED Provider Notes (Signed)
Patient initially seen by Dr. Clarene DukeLittle.  Please see her note.  Further ED work-up was notable for chest x-ray suggesting possible pneumonia.  On repeat exam at 1700 hr patient continues to wheeze.  Heart rate is in the low heart 100s at the bedside.  Her EKG is notable for atrial fibrillation with a rapid rate.  I have ordered a dose of verapamil IV.  Patient is breathing comfortably and does not appear to be in any danger of requiring intubation.  She does require additional treatment and I will consult the medical service for admission.   EKG Interpretation  Date/Time:  Monday June 24 2017 17:13:42 EDT Ventricular Rate:  126 PR Interval:    QRS Duration: 100 QT Interval:  360 QTC Calculation: 522 R Axis:   -95 Text Interpretation:  Right and left arm electrode reversal, interpretation assumes no reversal Atrial fibrillation Left anterior fascicular block Low voltage, precordial leads Abnormal R-wave progression, late transition Nonspecific repol abnormality, diffuse leads Prolonged QT interval Since last tracing rate faster Confirmed by Linwood DibblesKnapp, Thelma Viana [phone]) on 06/24/2017 5:20:35 PM         Linwood DibblesKnapp, Kannen Moxey, MD 06/24/17 1721

## 2017-06-24 NOTE — ED Notes (Signed)
ED TO INPATIENT HANDOFF REPORT  Name/Age/Gender Elaine Lee 72 y.o. female  Code Status Code Status History    Date Active Date Inactive Code Status Order ID Comments User Context   04/05/2016 0030 04/07/2016 0017 Full Code [id number]  Milagros Loll, MD Inpatient   06/13/2014 2303 06/16/2014 1812 Full Code [id number]  Bethena Roys, MD Inpatient      Home/SNF/Other Home - pt utilizes [facility] (home care)  Chief Complaint [facility] / asthma   Level of Care/Admitting Diagnosis ED Disposition    ED Disposition Condition [facility] Area: [place] [[id number]]  Level of Care: Telemetry [5]  Admit to tele based on following criteria: Monitor QTC interval  Diagnosis: COPD (chronic obstructive pulmonary disease) ([place]) [889169]  Admitting Physician: [place], [place] [[id number]]  Attending Physician: Raiford Noble LATIF [[id number]]  Estimated length of stay: past midnight tomorrow  Certification:: I certify this patient will need inpatient services for at least 2 midnights  PT Class (Do Not Modify): Inpatient [101]  PT Acc Code (Do Not Modify): Private [1]       Medical History Past Medical History:  Diagnosis Date  . A-fib ([place])   . Asthma   . CHF (congestive heart failure) ([place])   . Chronic edema   . CKD (chronic kidney disease), stage III ([place])   . COPD (chronic obstructive pulmonary disease) ([place])   . Degenerative lumbar disc   . Depression   . Diabetes mellitus without complication ([place])   . Diabetic neuropathy ([place])   . GERD (gastroesophageal reflux disease)   . Gout   . Hearing loss   . Hypercholesteremia   . Hypertension   . Insomnia   . Obstructive sleep apnea on CPAP   . Osteoarthritis    knees and shoulders  . Renal disorder   . Urinary frequency     Allergies Allergies  Allergen Reactions  . Other Shortness Of Breath    ROSEMARY AND HEAVY SCENTED PERFUMES, DETERGENTS, ETC  . Tape Itching and Rash     Burns and blisters USE PAPER TAPE  . Celebrex [Celecoxib] Other (See Comments)    HARD TIME HEARING, LIKE BEING UNDERWATER  . Latex Hives, Itching and Rash    Other reaction(s): RASH  . Mysoline [Primidone] Other (See Comments)    unknown  . Niacin And Related Hives and Swelling  . Sulfa Antibiotics Other (See Comments)    TONGUE BLISTERS    . Aspirin Other (See Comments)    GI PROBLEMS AND PAIN  . Trazodone And Nefazodone Swelling    IV Location/Drains/Wounds Patient Lines/Drains/Airways Status   Active Line/Drains/Airways    Name:   Placement date:   Placement time:   Site:   Days:   Peripheral IV 06/24/17 Left Antecubital   06/24/17    -    Antecubital   less than 1          Labs/Imaging Results for orders placed or performed during the hospital encounter of 06/24/17 (from the past 48 hour(s))  CBC with Differential     Status: Abnormal   Collection Time: 06/24/17  3:29 PM  Result Value Ref Range   WBC 7.7 4.0 - 10.5 K/uL   RBC 3.77 (L) 3.87 - 5.11 MIL/uL   Hemoglobin 10.9 (L) 12.0 - 15.0 g/dL   HCT 33.7 (L) 36.0 - 46.0 %   MCV 89.4 78.0 - 100.0 fL   MCH 28.9 26.0 - 34.0 pg  MCHC 32.3 30.0 - 36.0 g/dL   RDW 14.1 11.5 - 15.5 %   Platelets 255 150 - 400 K/uL   Neutrophils Relative % 89 %   Neutro Abs 6.8 1.7 - 7.7 K/uL   Lymphocytes Relative 8 %   Lymphs Abs 0.6 (L) 0.7 - 4.0 K/uL   Monocytes Relative 2 %   Monocytes Absolute 0.2 0.1 - 1.0 K/uL   Eosinophils Relative 1 %   Eosinophils Absolute 0.1 0.0 - 0.7 K/uL   Basophils Relative 0 %   Basophils Absolute 0.0 0.0 - 0.1 K/uL    Comment: Performed at [facility], [place] [address], [place], [place] [address]  Comprehensive metabolic panel     Status: Abnormal   Collection Time: 06/24/17  3:29 PM  Result Value Ref Range   Sodium 135 135 - 145 mmol/L   Potassium 4.5 3.5 - 5.1 mmol/L   Chloride 99 (L) 101 - 111 mmol/L   CO2 25 22 - 32 mmol/L   Glucose, Bld 265 (H) 65 - 99 mg/dL   BUN 27 (H)  6 - 20 mg/dL   Creatinine, Ser 0.98 0.44 - 1.00 mg/dL   Calcium 9.0 8.9 - 10.3 mg/dL   Total Protein 6.9 6.5 - 8.1 g/dL   Albumin 3.7 3.5 - 5.0 g/dL   AST 19 15 - 41 U/L   ALT 20 14 - 54 U/L   Alkaline Phosphatase 81 38 - 126 U/L   Total Bilirubin 0.5 0.3 - 1.2 mg/dL   GFR calc non Af Amer 55 (L) >60 mL/min   GFR calc Af Amer >60 >60 mL/min    Comment: (NOTE) The eGFR has been calculated using the CKD EPI equation. This calculation has not been validated in all clinical situations. eGFR's persistently <60 mL/min signify possible Chronic Kidney Disease.    Anion gap 11 5 - 15    Comment: Performed at [facility], [place] [address], [place], [place] [address]  Blood gas, venous     Status: Abnormal   Collection Time: 06/24/17  3:29 PM  Result Value Ref Range   pH, Ven 7.417 7.250 - 7.430   pCO2, Ven 41.1 (L) 44.0 - 60.0 mmHg   pO2, Ven 56.8 (H) 32.0 - 45.0 mmHg   Bicarbonate 26.0 20.0 - 28.0 mmol/L   Acid-Base Excess 1.8 0.0 - 2.0 mmol/L   O2 Saturation 87.6 %   Patient temperature 98.6    Collection site VENOUS    Drawn by DRAWN BY RN    Sample type VENOUS     Comment: Performed at [facility] [address], [place], [place] [address]  Brain natriuretic peptide     Status: Abnormal   Collection Time: 06/24/17  3:29 PM  Result Value Ref Range   B Natriuretic Peptide 124.0 (H) 0.0 - 100.0 pg/mL    Comment: Performed at [facility], [place] [address], [place], [place] [address]  I-stat troponin, ED     Status: None   Collection Time: 06/24/17  3:36 PM  Result Value Ref Range   Troponin i, poc 0.00 0.00 - 0.08 ng/mL   Comment 3            Comment: Due to the release kinetics of cTnI, a negative result within the first hours of the onset of symptoms does not rule out myocardial infarction with certainty. If myocardial infarction is still suspected, repeat the test at appropriate intervals.    Dg  Chest 2  View  Result Date: 06/24/2017 CLINICAL DATA:  Shortness of breath for 3 days. EXAM: CHEST - 2 VIEW COMPARISON:  04/04/2016. FINDINGS: Cardiomegaly. Mild vascular congestion. Slight retrocardiac density could represent atelectasis or developing consolidation. Small LEFT effusion. No pneumothorax. Aortic atherosclerosis. Bones unremarkable. IMPRESSION: Cardiomegaly. Slight retrocardiac density could represent atelectasis or developing consolidation. Electronically Signed   By: Staci Righter M.D.   On: 06/24/2017 17:07    Pending Labs Unresulted Labs (From admission, onward)   Start     Ordered   06/24/17 1736  Respiratory Panel by PCR  (Respiratory virus panel)  Once,   R     06/24/17 1735   Signed and Held  TSH  Once,   R     Signed and Held   Signed and [employer], sputum-assessment  Once,   R     Signed and Held   Signed and Held  Comprehensive metabolic panel  Tomorrow morning,   R     Signed and Held   Signed and Held  CBC  Tomorrow morning,   R     Signed and Held   Signed and Held  Hemoglobin A1c  Tomorrow morning,   R    Comments:  To assess prior glycemic control    Signed and Held      Vitals/Pain Today's Vitals   06/24/17 1600 06/24/17 1700 06/24/17 1730 06/24/17 1800  BP: (!) 149/88 (!) 186/76 (!) 169/82 (!) 175/130  Pulse: (!) 150 (!) 144 (!) 108 (!) 101  Resp: _0 (!) 21  Temp:      TempSrc:      SpO2: 92% (!) 87% 90% 90%  Weight:      Height:      PainSc:        Isolation Precautions Droplet precaution  Medications Medications  albuterol (PROVENTIL,VENTOLIN) solution continuous neb (0 mg/hr Nebulization Stopped 06/24/17 1633)  methylPREDNISolone sodium succinate (SOLU-MEDROL) 125 mg/2 mL injection 60 mg (60 mg Intravenous Given 06/24/17 1813)  doxycycline (VIBRAMYCIN) 100 mg in sodium chloride 0.9 % 250 mL IVPB (has no administration in time range)  levalbuterol (XOPENEX) nebulizer solution 0.63 mg (has no administration in time range)  ipratropium  (ATROVENT) nebulizer solution 0.5 mg (has no administration in time range)  ipratropium (ATROVENT) nebulizer solution 0.5 mg (0.5 mg Nebulization Given 06/24/17 1534)  cefTRIAXone (ROCEPHIN) 1 g in sodium chloride 0.9 % 100 mL IVPB (0 g Intravenous Stopped 06/24/17 1756)  azithromycin (ZITHROMAX) tablet 500 mg (500 mg Oral Given 06/24/17 1724)  verapamil (ISOPTIN) injection 2.5 mg (2.5 mg Intravenous Given 06/24/17 1725)  furosemide (LASIX) injection 40 mg (40 mg Intravenous Given 06/24/17 1813)    Mobility walks with device

## 2017-06-24 NOTE — ED Triage Notes (Signed)
Pt BIBA from Dr's office. SHOB x 3 days, hx of asthma and COPD. IM solumedrol 125, 15 albuterol 0.5 atrovent received at the doctor's office. Wheezing throughout lung fields noted. 5 albuterol 0.5 atrovent. 2 g of mag received en route. 87% RA with EMS.

## 2017-06-24 NOTE — ED Notes (Signed)
[facility] Expected date:  Expected time:  Means of arrival:  Comments: SOB-room 11

## 2017-06-25 ENCOUNTER — Inpatient Hospital Stay (HOSPITAL_COMMUNITY): Payer: Medicare (Managed Care)

## 2017-06-25 ENCOUNTER — Other Ambulatory Visit: Payer: Self-pay

## 2017-06-25 DIAGNOSIS — J181 Lobar pneumonia, unspecified organism: Secondary | ICD-10-CM

## 2017-06-25 LAB — CBC
HEMATOCRIT: 32.4 % — AB (ref 36.0–46.0)
Hemoglobin: 10.5 g/dL — ABNORMAL LOW (ref 12.0–15.0)
MCH: 28.8 pg (ref 26.0–34.0)
MCHC: 32.4 g/dL (ref 30.0–36.0)
MCV: 89 fL (ref 78.0–100.0)
Platelets: 256 10*3/uL (ref 150–400)
RBC: 3.64 MIL/uL — ABNORMAL LOW (ref 3.87–5.11)
RDW: 13.7 % (ref 11.5–15.5)
WBC: 6.8 10*3/uL (ref 4.0–10.5)

## 2017-06-25 LAB — RESPIRATORY PANEL BY PCR
Adenovirus: NOT DETECTED
BORDETELLA PERTUSSIS-RVPCR: NOT DETECTED
CORONAVIRUS 229E-RVPPCR: NOT DETECTED
CORONAVIRUS OC43-RVPPCR: NOT DETECTED
Chlamydophila pneumoniae: NOT DETECTED
Coronavirus HKU1: NOT DETECTED
Coronavirus NL63: NOT DETECTED
INFLUENZA A-RVPPCR: NOT DETECTED
INFLUENZA B-RVPPCR: NOT DETECTED
METAPNEUMOVIRUS-RVPPCR: NOT DETECTED
MYCOPLASMA PNEUMONIAE-RVPPCR: NOT DETECTED
PARAINFLUENZA VIRUS 1-RVPPCR: NOT DETECTED
PARAINFLUENZA VIRUS 4-RVPPCR: NOT DETECTED
Parainfluenza Virus 2: NOT DETECTED
Parainfluenza Virus 3: DETECTED — AB
Respiratory Syncytial Virus: NOT DETECTED
Rhinovirus / Enterovirus: NOT DETECTED

## 2017-06-25 LAB — HEMOGLOBIN A1C
HEMOGLOBIN A1C: 7.1 % — AB (ref 4.8–5.6)
MEAN PLASMA GLUCOSE: 157.07 mg/dL

## 2017-06-25 LAB — COMPREHENSIVE METABOLIC PANEL
ALK PHOS: 78 U/L (ref 38–126)
ALT: 20 U/L (ref 0–44)
AST: 18 U/L (ref 15–41)
Albumin: 3.4 g/dL — ABNORMAL LOW (ref 3.5–5.0)
Anion gap: 9 (ref 5–15)
BILIRUBIN TOTAL: 0.4 mg/dL (ref 0.3–1.2)
BUN: 28 mg/dL — AB (ref 8–23)
CALCIUM: 9.2 mg/dL (ref 8.9–10.3)
CO2: 29 mmol/L (ref 22–32)
Chloride: 98 mmol/L (ref 98–111)
Creatinine, Ser: 0.94 mg/dL (ref 0.44–1.00)
GFR calc Af Amer: >60 mL/min (ref >60)
GFR, EST NON AFRICAN AMERICAN: 58 mL/min — AB (ref >60)
Glucose, Bld: 294 mg/dL — ABNORMAL HIGH (ref 70–99)
POTASSIUM: 4.2 mmol/L (ref 3.5–5.1)
Sodium: 136 mmol/L (ref 135–145)
TOTAL PROTEIN: 6.9 g/dL (ref 6.5–8.1)

## 2017-06-25 LAB — GLUCOSE, CAPILLARY
GLUCOSE-CAPILLARY: 333 mg/dL — AB (ref 70–99)
GLUCOSE-CAPILLARY: 324 mg/dL — AB (ref 70–99)
Glucose-Capillary: 336 mg/dL — ABNORMAL HIGH (ref 70–99)
Glucose-Capillary: 281 mg/dL — ABNORMAL HIGH (ref 70–99)

## 2017-06-25 LAB — MAGNESIUM: Magnesium: 2.4 mg/dL (ref 1.7–2.4)

## 2017-06-25 LAB — PHOSPHORUS: Phosphorus: 3.7 mg/dL (ref 2.5–4.6)

## 2017-06-25 MED ORDER — SODIUM CHLORIDE 0.9 % IV SOLN
1.0000 g | INTRAVENOUS | Status: DC
  Administered 2017-06-25 – 2017-06-26 (×2): 1 g via INTRAVENOUS
  Filled 2017-06-25 (×2): qty 1

## 2017-06-25 MED ORDER — FLUCONAZOLE 200 MG PO TABS
200.0000 mg | ORAL_TABLET | Freq: Once | ORAL | Status: AC
  Administered 2017-06-25: 200 mg via ORAL
  Filled 2017-06-25: qty 1

## 2017-06-25 MED ORDER — LEVALBUTEROL HCL 0.63 MG/3ML IN NEBU
0.6300 mg | INHALATION_SOLUTION | Freq: Four times a day (QID) | RESPIRATORY_TRACT | Status: DC
  Administered 2017-06-25 – 2017-06-27 (×6): 0.63 mg via RESPIRATORY_TRACT
  Filled 2017-06-25 (×6): qty 3

## 2017-06-25 MED ORDER — IPRATROPIUM BROMIDE 0.02 % IN SOLN
0.5000 mg | Freq: Four times a day (QID) | RESPIRATORY_TRACT | Status: DC
Start: 2017-06-25 — End: 2017-06-27
  Administered 2017-06-25 – 2017-06-27 (×6): 0.5 mg via RESPIRATORY_TRACT
  Filled 2017-06-25 (×7): qty 2.5

## 2017-06-25 NOTE — Progress Notes (Signed)
PROGRESS NOTE    Elaine Lee  ZOX:[id number] DOB: 21-Mar-1945 DOA: 06/24/2017 PCP: Jethro Bastos, MD   Brief Narrative:  Elaine Lee is a 72 y.o. female with medical history significant of hypertension, hyperlipidemia, diastolic CHF, CKD stage III, type II days mellitus on Victoza, obstructive sleep apnea, atrial fibrillation on apixaban, history of asthm and other comorbidities who presents with worsening shortness of breath progressively got worse over the last week.  She states that shortness of breath is worse in the last 3 days and started to have a productive sputum along with a sore throat.  Patient was using her home nebulizer without much relief.  Patient also had diarrhea which is resolved now after she is taking the Imodium.  She states that she wants to breathe better and has been nauseous but has not had anything to eat.  States that she coughs so much she coughed her dentures out of her mouth.  States that her sputum used to be clear in color however changed to yellow a few days ago.  She went to go to be evaluated by [facility] who gave her IM Solu-Medrol, albuterol, Atrovent, and magnesium but because she remained hypoxic she was referred to the emergency room for evaluation. [facility] was called to admit this patient for acute respiratory failure with hypoxia along with a COPD exacerbation.  Assessment & Plan:   Active Problems:   COPD exacerbation (HCC)   DM2 (diabetes mellitus, type 2) (HCC)   CKD (chronic kidney disease), stage III (HCC)   HTN (hypertension)   Type 2 diabetes with stage 3 chronic kidney disease GFR 30-59 (HCC)   Acute on chronic respiratory failure with hypoxia (HCC)   OSA (obstructive sleep apnea)   COPD (chronic obstructive pulmonary disease) (HCC)   Atrial fibrillation with RVR (HCC)  Acute on chronic respiratory failure with hypoxia secondary to COPD exacerbation -Patient previously used to wear 2 to 3 L of oxygen  nightly but no longer wears oxygen per patient  -Patient was found to have a O2 saturation of 87% in the ED and was saturating 88 to 90% on 2 L so she was increased to 4 L; Continue to Wean O2 as tolerated  -Chest x-ray showed retro-cardiac density concerning for atelectasis versus early pneumonia -Repeat CXR this AM showed Right middle lobe infiltrate. -Patient was diffusely wheezing on examination yesterday and has had increasing shortness of breath with productive sputum over the last week; Seems somewhat improved today  -Continue supplemental oxygen via nasal cannula and wean as tolerated -Continuous pulse oximetry and maintain O2 saturations greater than 90% -Respiratory Virus Panel Positive for Parainfluenza Virus 3 -We will give 1 dose of IV Lasix 40 mg at this time -Repeat chest x-ray in the a.m. -Will need Home Ambulatory Walk Screen prior to D/C  -If not improving or significantly worsening will obtain CT of the chest to evaluate and consider pulmonary evaluation.  Patient sees Dr. Craige Cotta in outpatient setting  Acute exacerbation of COPD/Asthma  -Diffusely wheezing on examination -Continue supplemental oxygen via nasal cannula and wean as tolerated -Continuous pulse oximetry and maintain O2 saturations greater than 90% -Respiratory Virus Panel ordered and was positive for Parainfluenza Virus 3 -Was started on Doxycycline IV 100 mg twice daily on admission and will add IV Ceftriaxone  -Continue with Xopenex/Atrovent every 6 scheduled and also start scheduled Brovana and Budesonide however will stop Brovana today  -Was given IM Solu-Medrol patient's primary care physician's  office.  We will start the patient on scheduled Solu-Medrol 60 mg every 6h and continue today   -Guaifenesin 1200 mg p.o. twice daily, flutter valve, incentive spirometer -Also adding Pulmonary Toilet -Continue montelukast 10 mg p.o. Nightly  Right Middle Lobe PNA  -Present on Admission and in the setting of  Parainfluenza Virus 3 -Started on IV Doxycycline 100 mg BID -Will broaden and also add IV Ceftriaxone -Treatment as above  Atrial Fibrillation, stable  -Has mild RVR and likely was worsened in the setting of albuterol administration; HR's have now improved  -TSH was 1.158 -Given 2.5 mg of verapamil in the ED -Continue with home diltiazem 240 mill grams p.o. Daily -Continue with anticoagulation with Apixaban 5 mg p.o. twice daily  Chronic Diastolic CHF -Currently not in exacerbation -Continue home furosemide but in the interim we will give IV furosemide 40 mg to help with work of breathing -Checked BNP and was 124.0 -Strict I's and O's, daily weights -Patient is -1.503 Liters and Weight was not done  -Continue with Losartan 100 mg p.o. Daily  Obstructive sleep apnea -Patient has a CPAP at home but she is noncompliant with it -We will order CPAP while hospitalized  CKD stage III -BUN/Creatinine Stable on Admission at 27/0.98; Today it was 28/0.94 -BUN likely elevated in the setting of Steroid Administration -Continue to Monitor  GERD -Continue with Pantoprazole 40 mg p.o. daily as a substitution for Omeprazole 20 mg  Hypercholesterolemia/Hyperlipidemia -Continue with Atorvastatin 10 mg p.o. nightly  Depression -Continue with Citalopram 20 mg p.o. daily  Urinary frequency -Continue with Fesoterodine 4 mg daily  Diabetes mellitus type 2 -Patient only takes Victoza at home now -Placed on a sensitive sliding scale insulin before meals and at bedtime but will go up to Resistant Novolog SSI AC/HS -Checked Hemoglobin A1c and was 7.1 -Blood glucose on BMP was 265 on admission. -Expect blood sugars to be elevated in the setting of IV steroid use and will continue monitor very carefully -CBG's ranging from 333-336 prior to Increased Scale so will continue to Monitor CBG's  Normocytic Anemia -Likely in the setting of chronic kidney disease -Hb/Hct went from 10.9/33/7  -> 10.5/32.4 -Continue to monitor for signs and symptoms of bleeding -We will check anemia panel in a.m. -Repeat CBC in the a.m.  DVT prophylaxis: Anticoagulated with Apixaban 5 mg po BID Code Status: FULL CODE Family Communication: No family present at bedside  Disposition Plan: Remain Inpatient and will get PT/OT to evaluate  Consultants:   None  Procedures: None   Antimicrobials:  Anti-infectives (From admission, onward)   Start     Dose/Rate Route Frequency Ordered Stop   06/25/17 1545  cefTRIAXone (ROCEPHIN) 1 g in sodium chloride 0.9 % 100 mL IVPB     1 g 200 mL/hr over 30 Minutes Intravenous Every 24 hours 06/25/17 1539     06/25/17 0600  doxycycline (VIBRAMYCIN) 100 mg in sodium chloride 0.9 % 250 mL IVPB     100 mg 125 mL/hr over 120 Minutes Intravenous Every 12 hours 06/24/17 1820     06/24/17 1730  cefTRIAXone (ROCEPHIN) 1 g in sodium chloride 0.9 % 100 mL IVPB     1 g 200 mL/hr over 30 Minutes Intravenous  Once 06/24/17 1717 06/24/17 1756   06/24/17 1730  azithromycin (ZITHROMAX) tablet 500 mg     500 mg Oral  Once 06/24/17 1717 06/24/17 1724     Subjective: Seen and examined at bedside and states that she was feeling tremendously  better and was not a short of breath.  Wheezing is significantly improved.  No chest pain, lightheadedness or dizziness.  No other concerns or complaints at this time.  Objective: Vitals:   06/25/17 0218 06/25/17 0234 06/25/17 0557 06/25/17 0813  BP:   139/78   Pulse:  79 68 84  Resp:  20 (!) 22 18  Temp:   98.2 F (36.8 C)   TempSrc:      SpO2: 93% 98% 95% 95%  Weight:      Height:        Intake/Output Summary (Last 24 hours) at 06/25/2017 0827 Last data filed at 06/25/2017 0734 Gross per 24 hour  Intake 580 ml  Output 1700 ml  Net -1120 ml   Filed Weights   06/24/17 1427  Weight: 131.5 kg (290 lb)   Examination: Physical Exam:  Constitutional: WN/WD morbidly obese Caucasian female in NAD and appears calm and more  comfortable today  Eyes:  Lids and conjunctivae normal, sclerae anicteric  ENMT: External Ears, Nose appear normal. Grossly normal hearing. Mucous membranes are moist.  Neck: Appears normal, supple, no cervical masses, normal ROM, no appreciable thyromegaly, no JVD Respiratory: Diminished to auscultation bilaterally with improved wheezing and mild rhonchi; No rales or crackles. Normal respiratory effort and patient is not tachypenic. No accessory muscle use but is wearing supplemental O2 via [place] Cardiovascular: Irregularly Irregular, no murmurs / rubs / gallops. S1 and S2 auscultated. No appreciable extremity edema. Abdomen: Soft, non-tender, Distended due to body habitus. No masses palpated. No appreciable hepatosplenomegaly. Bowel sounds positive x4.  GU: Deferred. Musculoskeletal: No clubbing / cyanosis of digits/nails. No joint deformity upper and lower extremities.  Skin: No rashes, lesions, ulcers on a limited skin evaluation. No induration; Warm and dry.  Neurologic: CN 2-12 grossly intact with no focal deficits. Romberg sign and cerebellar reflexes not assessed.  Psychiatric: Normal judgment and insight. Alert and oriented x 3. Normal mood and appropriate affect.   Data Reviewed: I have personally reviewed following labs and imaging studies  CBC: Recent Labs  Lab 06/24/17 1529 06/25/17 0609  WBC 7.7 6.8  NEUTROABS 6.8  --   HGB 10.9* 10.5*  HCT 33.7* 32.4*  MCV 89.4 89.0  PLT 255 256   Basic Metabolic Panel: Recent Labs  Lab 06/24/17 1529 06/25/17 0609  NA 135 136  K 4.5 4.2  CL 99* 98  CO2 25 29  GLUCOSE 265* 294*  BUN 27* 28*  CREATININE 0.98 0.94  CALCIUM 9.0 9.2  MG  --  2.4  PHOS  --  3.7   GFR: Estimated Creatinine Clearance: 71.9 mL/min (by C-G formula based on SCr of 0.94 mg/dL). Liver Function Tests: Recent Labs  Lab 06/24/17 1529 06/25/17 0609  AST 19 18  ALT 20 20  ALKPHOS 81 78  BILITOT 0.5 0.4  PROT 6.9 6.9  ALBUMIN 3.7 3.4*   No results  for input(s): LIPASE, AMYLASE in the last 168 hours. No results for input(s): AMMONIA in the last 168 hours. Coagulation Profile: No results for input(s): INR, PROTIME in the last 168 hours. Cardiac Enzymes: No results for input(s): CKTOTAL, CKMB, CKMBINDEX, TROPONINI in the last 168 hours. BNP (last 3 results) No results for input(s): PROBNP in the last 8760 hours. HbA1C: Recent Labs    06/25/17 0609  HGBA1C 7.1*   CBG: Recent Labs  Lab 06/24/17 2033  GLUCAP 514*   Lipid Profile: No results for input(s): CHOL, HDL, LDLCALC, TRIG, CHOLHDL, LDLDIRECT in the last  72 hours. Thyroid Function Tests: Recent Labs    06/24/17 1529  TSH 1.158   Anemia Panel: No results for input(s): VITAMINB12, FOLATE, FERRITIN, TIBC, IRON, RETICCTPCT in the last 72 hours. Sepsis Labs: No results for input(s): PROCALCITON, LATICACIDVEN in the last 168 hours.  No results found for this or any previous visit (from the past 240 hour(s)).       Radiology Studies: Dg Chest 2 View  Result Date: 06/24/2017 CLINICAL DATA:  Shortness of breath for 3 days. EXAM: CHEST - 2 VIEW COMPARISON:  04/04/2016. FINDINGS: Cardiomegaly. Mild vascular congestion. Slight retrocardiac density could represent atelectasis or developing consolidation. Small LEFT effusion. No pneumothorax. Aortic atherosclerosis. Bones unremarkable. IMPRESSION: Cardiomegaly. Slight retrocardiac density could represent atelectasis or developing consolidation. Electronically Signed   By: Elsie Stain M.D.   On: 06/24/2017 17:07   Scheduled Meds: . apixaban  5 mg Oral BID  . arformoterol  15 mcg Nebulization BID  . atorvastatin  10 mg Oral Daily  . budesonide (PULMICORT) nebulizer solution  0.25 mg Nebulization BID  . citalopram  20 mg Oral Daily  . diltiazem  240 mg Oral Daily  . fesoterodine  4 mg Oral Daily  . furosemide  60 mg Oral Daily  . gabapentin  600 mg Oral Daily   And  . gabapentin  1,200 mg Oral QHS  . guaiFENesin  1,200  mg Oral BID  . insulin aspart  0-15 Units Subcutaneous TID WC  . insulin aspart  0-5 Units Subcutaneous QHS  . ipratropium  0.5 mg Nebulization Q6H  . levalbuterol  0.63 mg Nebulization Q6H  . loratadine  10 mg Oral q morning - 10a  . losartan  100 mg Oral Daily  . methylPREDNISolone (SOLU-MEDROL) injection  60 mg Intravenous Q6H  . montelukast  10 mg Oral QHS  . pantoprazole  40 mg Oral Daily  . potassium chloride  10 mEq Oral Daily  . senna  1 tablet Oral BID  . traMADol  50 mg Oral BID   Continuous Infusions: . doxycycline (VIBRAMYCIN) IV 100 mg (06/25/17 0734)    LOS: 1 day   Merlene Laughter, DO [facility] Pager [phone]  If 7PM-7AM, please contact night-coverage [email or web address] Password [facility] 06/25/2017, 8:27 AM

## 2017-06-26 ENCOUNTER — Inpatient Hospital Stay (HOSPITAL_COMMUNITY): Payer: Medicare (Managed Care)

## 2017-06-26 DIAGNOSIS — J189 Pneumonia, unspecified organism: Secondary | ICD-10-CM

## 2017-06-26 DIAGNOSIS — J45901 Unspecified asthma with (acute) exacerbation: Secondary | ICD-10-CM

## 2017-06-26 DIAGNOSIS — B348 Other viral infections of unspecified site: Secondary | ICD-10-CM

## 2017-06-26 LAB — CBC WITH DIFFERENTIAL/PLATELET
BASOS ABS: 0 10*3/uL (ref 0.0–0.1)
Basophils Relative: 0 %
EOS ABS: 0 10*3/uL (ref 0.0–0.7)
EOS PCT: 0 %
HCT: 34.4 % — ABNORMAL LOW (ref 36.0–46.0)
Hemoglobin: 11 g/dL — ABNORMAL LOW (ref 12.0–15.0)
LYMPHS ABS: 0.6 10*3/uL — AB (ref 0.7–4.0)
LYMPHS PCT: 5 %
MCH: 28.6 pg (ref 26.0–34.0)
MCHC: 32 g/dL (ref 30.0–36.0)
MCV: 89.6 fL (ref 78.0–100.0)
MONO ABS: 0.4 10*3/uL (ref 0.1–1.0)
Monocytes Relative: 3 %
Neutro Abs: 11.6 10*3/uL — ABNORMAL HIGH (ref 1.7–7.7)
Neutrophils Relative %: 92 %
PLATELETS: 274 10*3/uL (ref 150–400)
RBC: 3.84 MIL/uL — ABNORMAL LOW (ref 3.87–5.11)
RDW: 13.7 % (ref 11.5–15.5)
WBC: 12.6 10*3/uL — AB (ref 4.0–10.5)

## 2017-06-26 LAB — VITAMIN B12: VITAMIN B 12: 496 pg/mL (ref 180–914)

## 2017-06-26 LAB — IRON AND TIBC
Iron: 37 ug/dL (ref 28–170)
SATURATION RATIOS: 11 % (ref 10.4–31.8)
TIBC: 343 ug/dL (ref 250–450)
UIBC: 306 ug/dL

## 2017-06-26 LAB — COMPREHENSIVE METABOLIC PANEL
ALK PHOS: 75 U/L (ref 38–126)
ALT: 22 U/L (ref 0–44)
ANION GAP: 10 (ref 5–15)
AST: 22 U/L (ref 15–41)
Albumin: 3.4 g/dL — ABNORMAL LOW (ref 3.5–5.0)
BILIRUBIN TOTAL: 0.5 mg/dL (ref 0.3–1.2)
BUN: 33 mg/dL — ABNORMAL HIGH (ref 8–23)
CO2: 28 mmol/L (ref 22–32)
Calcium: 9.3 mg/dL (ref 8.9–10.3)
Chloride: 99 mmol/L (ref 98–111)
Creatinine, Ser: 0.88 mg/dL (ref 0.44–1.00)
GFR calc non Af Amer: >60 mL/min (ref >60)
GLUCOSE: 351 mg/dL — AB (ref 70–99)
POTASSIUM: 3.7 mmol/L (ref 3.5–5.1)
SODIUM: 137 mmol/L (ref 135–145)
TOTAL PROTEIN: 6.9 g/dL (ref 6.5–8.1)

## 2017-06-26 LAB — GLUCOSE, CAPILLARY
GLUCOSE-CAPILLARY: 327 mg/dL — AB (ref 70–99)
GLUCOSE-CAPILLARY: 328 mg/dL — AB (ref 70–99)
GLUCOSE-CAPILLARY: 284 mg/dL — AB (ref 70–99)

## 2017-06-26 LAB — FERRITIN: Ferritin: 39 ng/mL (ref 11–307)

## 2017-06-26 LAB — PHOSPHORUS: PHOSPHORUS: 3.5 mg/dL (ref 2.5–4.6)

## 2017-06-26 LAB — FOLATE: FOLATE: 11.8 ng/mL (ref >5.9)

## 2017-06-26 LAB — MAGNESIUM: Magnesium: 2.2 mg/dL (ref 1.7–2.4)

## 2017-06-26 LAB — RETICULOCYTES
RBC.: 3.84 MIL/uL — ABNORMAL LOW (ref 3.87–5.11)
RETIC COUNT ABSOLUTE: 80.6 10*3/uL (ref 19.0–186.0)
Retic Ct Pct: 2.1 % (ref 0.4–3.1)

## 2017-06-26 MED ORDER — HYDROCOD POLST-CPM POLST ER 10-8 MG/5ML PO SUER
5.0000 mL | Freq: Once | ORAL | Status: AC
  Administered 2017-06-26: 5 mL via ORAL
  Filled 2017-06-26: qty 5

## 2017-06-26 MED ORDER — LIRAGLUTIDE 18 MG/3ML ~~LOC~~ SOPN: 1.2000 mg | PEN_INJECTOR | Freq: Every day | SUBCUTANEOUS | Status: DC

## 2017-06-26 MED ORDER — METHYLPREDNISOLONE SODIUM SUCC 125 MG IJ SOLR
60.0000 mg | Freq: Two times a day (BID) | INTRAMUSCULAR | Status: DC
  Administered 2017-06-26 – 2017-06-27 (×2): 60 mg via INTRAVENOUS
  Filled 2017-06-26 (×2): qty 2

## 2017-06-26 MED ORDER — INSULIN GLARGINE 100 UNIT/ML ~~LOC~~ SOLN
20.0000 [IU] | Freq: Every day | SUBCUTANEOUS | Status: DC
  Administered 2017-06-26: 20 [IU] via SUBCUTANEOUS
  Filled 2017-06-26 (×2): qty 0.2

## 2017-06-26 MED ORDER — HYDROCOD POLST-CPM POLST ER 10-8 MG/5ML PO SUER
5.0000 mL | Freq: Two times a day (BID) | ORAL | Status: DC
  Administered 2017-06-26 – 2017-06-27 (×2): 5 mL via ORAL
  Filled 2017-06-26 (×3): qty 5

## 2017-06-26 MED ORDER — MELATONIN 5 MG PO TABS
10.0000 mg | ORAL_TABLET | Freq: Every day | ORAL | Status: DC
  Administered 2017-06-26: 10 mg via ORAL
  Filled 2017-06-26: qty 2

## 2017-06-26 NOTE — Progress Notes (Signed)
PROGRESS NOTE    Elaine Lee   GNF:[id number]  DOB: [birth year]  DOA: 06/24/2017 PCP: Jethro Bastos, MD   Brief Narrative:  Elaine Lee is a 72 y.o.femalewith medical history significant ofhypertension, hyperlipidemia, diastolic CHF, CKD stage III, type II days mellitus on Victoza, obstructive sleep apnea, atrial fibrillation on apixaban, history of asthma and other comorbidities who presents with worsening shortness of breath progressively got worse over the last week. She also has yellow sputum.  Subjective: Has severe cough and ongoing wheezing. Mild dyspnea on exertion.    Assessment & Plan:   Principal Problem:   Acute on chronic respiratory failure with hypoxia (HCC)   COPD/ asthma exacerbation   Parainfluenza + with RML infiltrate - cont Doxycycline, Nebs, and Solumedrol - cont Singulair - add Tussionex routine for cough - f/u ambulatory pulse ox     Atrial fibrillation with RVR  - Eliquis- Cardizem    DM2 (diabetes mellitus, type 2)  - add Lantus- she was on 70/30 int he past but this was stopped once she lost weight - she will go home with Lantus while on a steroid taper as her sugars are > 300 - cont SSI - resume Victoza today as well    HTN (hypertension) - Cozaar, Cardizem    OSA (obstructive sleep apnea) - CPAP   DVT prophylaxis: Eliquis Code Status: Full code Family Communication:  Disposition Plan: home  Consultants:   none Procedures:   none Antimicrobials:  Anti-infectives (From admission, onward)   Start     Dose/Rate Route Frequency Ordered Stop   06/25/17 2200  fluconazole (DIFLUCAN) tablet 200 mg     200 mg Oral  Once 06/25/17 2147 06/25/17 2224   06/25/17 1800  cefTRIAXone (ROCEPHIN) 1 g in sodium chloride 0.9 % 100 mL IVPB     1 g 200 mL/hr over 30 Minutes Intravenous Every 24 hours 06/25/17 1539     06/25/17 0600  doxycycline (VIBRAMYCIN) 100 mg in sodium chloride 0.9 % 250 mL IVPB     100 mg 125 mL/hr over  120 Minutes Intravenous Every 12 hours 06/24/17 1820     06/24/17 1730  cefTRIAXone (ROCEPHIN) 1 g in sodium chloride 0.9 % 100 mL IVPB     1 g 200 mL/hr over 30 Minutes Intravenous  Once 06/24/17 1717 06/24/17 1756   06/24/17 1730  azithromycin (ZITHROMAX) tablet 500 mg     500 mg Oral  Once 06/24/17 1717 06/24/17 1724       Objective: Vitals:   06/26/17 0618 06/26/17 0820 06/26/17 1121 06/26/17 1349  BP: 137/78   139/72  Pulse: (!) 59   (!) 50  Resp: 20     Temp: 97.7 F (36.5 C)   98.2 F (36.8 C)  TempSrc:    Oral  SpO2: 97% 97% 95% 96%  Weight:      Height:        Intake/Output Summary (Last 24 hours) at 06/26/2017 1421 Last data filed at 06/26/2017 1352 Gross per 24 hour  Intake 1190 ml  Output 900 ml  Net 290 ml   Filed Weights   06/24/17 1427  Weight: 131.5 kg (290 lb)    Examination: General exam: Appears comfortable  HEENT: PERRLA, oral mucosa moist, no sclera icterus or thrush Respiratory system: Clear to auscultation. Respiratory effort normal. Cardiovascular system: S1 & S2 heard, RRR.   Gastrointestinal system: Abdomen soft, non-tender, nondistended. Normal bowel sound. No organomegaly Central nervous system: Alert and oriented. No  focal neurological deficits. Extremities: No cyanosis, clubbing or edema Skin: No rashes or ulcers Psychiatry:  Mood & affect appropriate.     Data Reviewed: I have personally reviewed following labs and imaging studies  CBC: Recent Labs  Lab 06/24/17 1529 06/25/17 0609 06/26/17 0614  WBC 7.7 6.8 12.6*  NEUTROABS 6.8  --  11.6*  HGB 10.9* 10.5* 11.0*  HCT 33.7* 32.4* 34.4*  MCV 89.4 89.0 89.6  PLT 255 256 274   Basic Metabolic Panel: Recent Labs  Lab 06/24/17 1529 06/25/17 0609 06/26/17 0614  NA 135 136 137  K 4.5 4.2 3.7  CL 99* 98 99  CO2 25 29 28   GLUCOSE 265* 294* 351*  BUN 27* 28* 33*  CREATININE 0.98 0.94 0.88  CALCIUM 9.0 9.2 9.3  MG  --  2.4 2.2  PHOS  --  3.7 3.5   GFR: Estimated  Creatinine Clearance: 76.9 mL/min (by C-G formula based on SCr of 0.88 mg/dL). Liver Function Tests: Recent Labs  Lab 06/24/17 1529 06/25/17 0609 06/26/17 0614  AST 19 18 22   ALT 20 20 22   ALKPHOS 81 78 75  BILITOT 0.5 0.4 0.5  PROT 6.9 6.9 6.9  ALBUMIN 3.7 3.4* 3.4*   No results for input(s): LIPASE, AMYLASE in the last 168 hours. No results for input(s): AMMONIA in the last 168 hours. Coagulation Profile: No results for input(s): INR, PROTIME in the last 168 hours. Cardiac Enzymes: No results for input(s): CKTOTAL, CKMB, CKMBINDEX, TROPONINI in the last 168 hours. BNP (last 3 results) No results for input(s): PROBNP in the last 8760 hours. HbA1C: Recent Labs    06/25/17 0609  HGBA1C 7.1*   CBG: Recent Labs  Lab 06/25/17 1116 06/25/17 1644 06/25/17 2137 06/26/17 0738 06/26/17 1132  GLUCAP 336* 324* 281* 327* 328*   Lipid Profile: No results for input(s): CHOL, HDL, LDLCALC, TRIG, CHOLHDL, LDLDIRECT in the last 72 hours. Thyroid Function Tests: Recent Labs    06/24/17 1529  TSH 1.158   Anemia Panel: Recent Labs    06/26/17 0614  VITAMINB12 496  FOLATE 11.8  FERRITIN 39  TIBC 343  IRON 37  RETICCTPCT 2.1   Urine analysis:    Component Value Date/Time   COLORURINE AMBER (A) 04/04/2016 1627   APPEARANCEUR CLOUDY (A) 04/04/2016 1627   LABSPEC 1.017 04/04/2016 1627   PHURINE 8.0 04/04/2016 1627   GLUCOSEU NEGATIVE 04/04/2016 1627   HGBUR NEGATIVE 04/04/2016 1627   BILIRUBINUR NEGATIVE 04/04/2016 1627   KETONESUR NEGATIVE 04/04/2016 1627   PROTEINUR 30 (A) 04/04/2016 1627   UROBILINOGEN 0.2 06/14/2014 0636   NITRITE POSITIVE (A) 04/04/2016 1627   LEUKOCYTESUR NEGATIVE 04/04/2016 1627   Sepsis Labs: @LABRCNTIP (procalcitonin:4,lacticidven:4) ) Recent Results (from the past 240 hour(s))  Respiratory Panel by PCR     Status: Abnormal   Collection Time: 06/25/17 12:15 AM  Result Value Ref Range Status   Adenovirus NOT DETECTED NOT DETECTED Final    Coronavirus 229E NOT DETECTED NOT DETECTED Final   Coronavirus HKU1 NOT DETECTED NOT DETECTED Final   Coronavirus NL63 NOT DETECTED NOT DETECTED Final   Coronavirus OC43 NOT DETECTED NOT DETECTED Final   Metapneumovirus NOT DETECTED NOT DETECTED Final   Rhinovirus / Enterovirus NOT DETECTED NOT DETECTED Final   Influenza A NOT DETECTED NOT DETECTED Final   Influenza B NOT DETECTED NOT DETECTED Final   Parainfluenza Virus 1 NOT DETECTED NOT DETECTED Final   Parainfluenza Virus 2 NOT DETECTED NOT DETECTED Final   Parainfluenza Virus 3 DETECTED (  A) NOT DETECTED Final   Parainfluenza Virus 4 NOT DETECTED NOT DETECTED Final   Respiratory Syncytial Virus NOT DETECTED NOT DETECTED Final   Bordetella pertussis NOT DETECTED NOT DETECTED Final   Chlamydophila pneumoniae NOT DETECTED NOT DETECTED Final   Mycoplasma pneumoniae NOT DETECTED NOT DETECTED Final    Comment: Performed at [facility], [address] [place], [place] [address]         Radiology Studies: Dg Chest 2 View  Result Date: 06/25/2017 CLINICAL DATA:  Fever and cough EXAM: CHEST - 2 VIEW COMPARISON:  06/24/2017 FINDINGS: Cardiac shadow is enlarged but accentuated by the frontal technique. The lungs are well aerated bilaterally. Increased density is noted in the medial right lung base consistent with right middle lobe infiltrate. Small effusions are noted posteriorly. IMPRESSION: Right middle lobe infiltrate. Electronically Signed   By: Alcide CleverMark  Lukens M.D.   On: 06/25/2017 10:23   Dg Chest 2 View  Result Date: 06/24/2017 CLINICAL DATA:  Shortness of breath for 3 days. EXAM: CHEST - 2 VIEW COMPARISON:  04/04/2016. FINDINGS: Cardiomegaly. Mild vascular congestion. Slight retrocardiac density could represent atelectasis or developing consolidation. Small LEFT effusion. No pneumothorax. Aortic atherosclerosis. Bones unremarkable. IMPRESSION: Cardiomegaly. Slight retrocardiac density could represent atelectasis or developing  consolidation. Electronically Signed   By: Elsie StainJohn T Curnes M.D.   On: 06/24/2017 17:07   Dg Chest Port 1 View  Result Date: 06/26/2017 CLINICAL DATA:  Shortness of breath EXAM: PORTABLE CHEST 1 VIEW COMPARISON:  06/25/2017 FINDINGS: Cardiac shadow remains enlarged. The lungs are well aerated bilaterally. Improved aeration is noted in the right lung base. Some minimal residual density is noted medially. No other focal abnormality is seen. IMPRESSION: Significant improved aeration in the right middle lobe. Some mild residual infiltrate is noted. The previously seen changes may have been related to mucous plugging. Electronically Signed   By: Alcide CleverMark  Lukens M.D.   On: 06/26/2017 08:21      Scheduled Meds: . apixaban  5 mg Oral BID  . atorvastatin  10 mg Oral Daily  . budesonide (PULMICORT) nebulizer solution  0.25 mg Nebulization BID  . chlorpheniramine-HYDROcodone  5 mL Oral Q12H  . citalopram  20 mg Oral Daily  . diltiazem  240 mg Oral Daily  . fesoterodine  4 mg Oral Daily  . furosemide  60 mg Oral Daily  . gabapentin  600 mg Oral Daily   And  . gabapentin  1,200 mg Oral QHS  . guaiFENesin  1,200 mg Oral BID  . insulin aspart  0-15 Units Subcutaneous TID WC  . insulin aspart  0-5 Units Subcutaneous QHS  . insulin glargine  20 Units Subcutaneous Daily  . ipratropium  0.5 mg Nebulization QID  . levalbuterol  0.63 mg Nebulization QID  . liraglutide  1.2 mg Subcutaneous Daily  . loratadine  10 mg Oral q morning - 10a  . losartan  100 mg Oral Daily  . Melatonin  10 mg Oral QHS  . methylPREDNISolone (SOLU-MEDROL) injection  60 mg Intravenous Q12H  . montelukast  10 mg Oral QHS  . pantoprazole  40 mg Oral Daily  . potassium chloride  10 mEq Oral Daily  . senna  1 tablet Oral BID  . traMADol  50 mg Oral BID   Continuous Infusions: . cefTRIAXone (ROCEPHIN)  IV Stopped (06/25/17 1745)  . doxycycline (VIBRAMYCIN) IV Stopped (06/26/17 0730)     LOS: 2 days    Time spent in minutes:  35  Calvert Cantor, MD [facility] Pager: [email or web address] Password [email or web address] 06/26/2017, 2:21 PM

## 2017-06-26 NOTE — Progress Notes (Signed)
Inpatient Diabetes Program Recommendations  AACE/ADA: New Consensus Statement on Inpatient Glycemic Control (2015)  Target Ranges:  Prepandial:   less than 140 mg/dL      Peak postprandial:   less than 180 mg/dL (1-2 hours)      Critically ill patients:  140 - 180 mg/dL   Lab Results  Component Value Date   GLUCAP 328 (H) 06/26/2017   HGBA1C 7.1 (H) 06/25/2017    Review of Glycemic Control  Agree with addition of basal insulin. Post-prandials elevated. Needs meal coverage insulin.  Inpatient Diabetes Program Recommendations:     Add Novolog 4 units tidwc for meal coverage insulin.  Will follow closely.  Thank you. Ailene Ardshonda Scottlyn Mchaney, RD, LDN, CDE Inpatient Diabetes Coordinator [phone]

## 2017-06-26 NOTE — Progress Notes (Signed)
PT demonstrated hands on understanding of Flutter device. 

## 2017-06-27 DIAGNOSIS — B348 Other viral infections of unspecified site: Secondary | ICD-10-CM

## 2017-06-27 LAB — GLUCOSE, CAPILLARY: GLUCOSE-CAPILLARY: 306 mg/dL — AB (ref 70–99)

## 2017-06-27 MED ORDER — IPRATROPIUM-ALBUTEROL 0.5-2.5 (3) MG/3ML IN SOLN: 3.0000 mL | Freq: Four times a day (QID) | RESPIRATORY_TRACT | 0 refills | Status: DC

## 2017-06-27 MED ORDER — MELATONIN 5 MG PO TABS: 10.0000 mg | ORAL_TABLET | Freq: Every day | ORAL | 0 refills | Status: DC

## 2017-06-27 MED ORDER — SENNA 8.6 MG PO TABS
1.0000 | ORAL_TABLET | Freq: Two times a day (BID) | ORAL | 0 refills | Status: DC
Start: 2017-06-27 — End: 2017-12-06

## 2017-06-27 MED ORDER — INSULIN GLARGINE 100 UNIT/ML ~~LOC~~ SOLN
28.0000 [IU] | Freq: Every day | SUBCUTANEOUS | Status: DC
  Administered 2017-06-27: 28 [IU] via SUBCUTANEOUS
  Filled 2017-06-27: qty 0.28

## 2017-06-27 MED ORDER — DOXYCYCLINE MONOHYDRATE 100 MG PO TABS: 100.0000 mg | ORAL_TABLET | Freq: Two times a day (BID) | ORAL | 0 refills | Status: DC

## 2017-06-27 MED ORDER — DEXTROMETHORPHAN-GUAIFENESIN 10-100 MG/5ML PO LIQD: 5.0000 mL | ORAL | 0 refills | Status: DC | PRN

## 2017-06-27 MED ORDER — PREDNISONE 10 MG PO TABS: ORAL_TABLET | ORAL | 0 refills | Status: DC

## 2017-06-27 MED ORDER — HYDROCOD POLST-CPM POLST ER 10-8 MG/5ML PO SUER: 5.0000 mL | Freq: Two times a day (BID) | ORAL | 0 refills | Status: DC

## 2017-06-27 MED ORDER — INSULIN GLARGINE 100 UNIT/ML ~~LOC~~ SOLN: 28.0000 [IU] | Freq: Every day | SUBCUTANEOUS | 11 refills | Status: DC

## 2017-06-27 NOTE — Care Management Note (Signed)
Case Management Note  Patient Details  Name: Larey BrickMelanie K Markie MRN: [id number] Date of Birth: [birth year]  Subjective/Objective:                  discharge Action/Plan: [facility]  They are arranging home transportation. Expected Discharge Date:  06/27/17               Expected Discharge Plan:  Home/Self Care  In-House Referral:     Discharge planning Services  CM Consult, Other - See comment  Post Acute Care Choice:    Choice offered to:     DME Arranged:    DME Agency:     HH Arranged:    HH Agency:     Status of Service:  Completed, signed off  If discussed at [employer] Length of Stay Meetings, dates discussed:    Additional Comments:  Golda AcreDavis, Rhonda Lynn, RN 06/27/2017, 10:45 AM

## 2017-06-27 NOTE — Progress Notes (Signed)
SATURATION QUALIFICATIONS: (This note is used to comply with regulatory documentation for home oxygen)  Patient Saturations on Room Air at Rest = 96%  Patient Saturations on Room Air while Ambulating  93%  Patient Saturations on N/A Liters of oxygen while Ambulating = N/A%  Please briefly explain why patient needs home oxygen: 

## 2017-06-27 NOTE — Discharge Summary (Signed)
Physician Discharge Summary  Elaine BrickMelanie K Almquist ZOX:[id number] DOB: [birth year] DOA: 06/24/2017  PCP: Jethro BastosKoehler, Robert N, MD  Admit date: 06/24/2017 Discharge date: 06/27/2017  Admitted From: home Disposition:  home   Recommendations for Outpatient Follow-up:  1. F/u on glucose and Lantus dosing     Discharge Condition:  stable   CODE STATUS:  Full code   Consultations:  none    Discharge Diagnoses:  Principal Problem:   Acute on chronic respiratory failure with hypoxia (HCC) Active Problems:   COPD exacerbation (HCC)   DM2 (diabetes mellitus, type 2) (HCC)   CKD (chronic kidney disease), stage III (HCC)   HTN (hypertension)   Type 2 diabetes with stage 3 chronic kidney disease GFR 30-59 (HCC)   OSA (obstructive sleep apnea)   Atrial fibrillation with RVR (HCC)   Infection due to parainfluenza virus 3       Brief Summary: Elaine Lee is a 72 y.o.femalewith medical history significant ofhypertension, hyperlipidemia, diastolic CHF, CKD stage III, type II days mellitus on Victoza, obstructive sleep apnea, atrial fibrillation on apixaban, history of asthma and other comorbidities who presents with worsening shortness of breath progressively got worse over the last week. She also has yellow sputum.  Hospital Course:  Principal Problem:   Acute on chronic respiratory failure with hypoxia (HCC)   COPD/ asthma exacerbation   Parainfluenza + with RML infiltrate - significantly improved - cont Doxycycline x 5 days, Nebs, prednisone taper and Tussionex - cont Singulair - was weaned to room air yesterday-  ambulatory pulse ox today 93% on room air    Atrial fibrillation with RVR  - Eliquis- Cardizem    DM2 (diabetes mellitus, type 2)  - added Lantus and increased this today as AM sugar is still > 300- she has peaches and honey nut cheerios on her tray and has been advised to make better food choices this AM - she was on 70/30 int he past but this was stopped once  she lost weight - cont SSI - resumed Victoza  - advised to wean Lantus if sugars start to improve once Prednisone is tapered    HTN (hypertension) - Cozaar, Cardizem    OSA (obstructive sleep apnea) - CPAP  Discharge Exam: Vitals:   06/27/17 0603 06/27/17 0805  BP: (!) 151/82   Pulse: 63   Resp: 20   Temp:    SpO2: 94% 92%   Vitals:   06/26/17 2000 06/26/17 2056 06/27/17 0603 06/27/17 0805  BP:  139/70 (!) 151/82   Pulse:  66 63   Resp:  (!) 21 20   Temp:  98.4 F (36.9 C)    TempSrc:  Oral    SpO2: 98% 96% 94% 92%  Weight:   112.8 kg (248 lb 10.9 oz)   Height:        General: Pt is alert, awake, not in acute distress Cardiovascular: RRR, S1/S2 +, no rubs, no gallops Respiratory: CTA bilaterally, no wheezing, no rhonchi Abdominal: Soft, NT, ND, bowel sounds + Extremities: no edema, no cyanosis   Discharge Instructions  Discharge Instructions    Diet - low sodium heart healthy   Complete by:  As directed    Diet Carb Modified   Complete by:  As directed    Discharge instructions   Complete by:  As directed    Check your sugars 3 x a day before meals and start to cut back on your Lantus by 4 U daily if sugars is < 100 every  morning. If sugar is > 100, continue the same dose of Lantus. Take your record of sugars to your PCP next week.   Increase activity slowly   Complete by:  As directed      Allergies as of 06/27/2017      Reactions   Other Shortness Of Breath   ROSEMARY AND HEAVY SCENTED PERFUMES, DETERGENTS, ETC   Tape Itching, Rash   Burns and blisters USE PAPER TAPE   Celebrex [celecoxib] Other (See Comments)   HARD TIME HEARING, LIKE BEING UNDERWATER   Latex Hives, Itching, Rash   Other reaction(s): RASH   Mysoline [primidone] Other (See Comments)   unknown   Niacin And Related Hives, Swelling   Sulfa Antibiotics Other (See Comments)   TONGUE BLISTERS    Aspirin Other (See Comments)   GI PROBLEMS AND PAIN   Trazodone And Nefazodone Swelling       Medication List    STOP taking these medications   BIOFREEZE ROLL-ON 4 % Gel Generic drug:  Menthol (Topical Analgesic)   cephALEXin 500 MG capsule Commonly known as:  KEFLEX   guaiFENesin 600 MG 12 hr tablet Commonly known as:  MUCINEX   insulin aspart protamine- aspart (70-30) 100 UNIT/ML injection Commonly known as:  NOVOLOG MIX 70/30   OXYGEN     TAKE these medications   acetaminophen 325 MG tablet Commonly known as:  TYLENOL Take 2 tablets (650 mg total) by mouth every 8 (eight) hours as needed.   albuterol 108 (90 Base) MCG/ACT inhaler Commonly known as:  PROVENTIL HFA;VENTOLIN HFA Inhale 2 puffs into the lungs every 6 (six) hours as needed for wheezing or shortness of breath.   antiseptic oral rinse Liqd 15 mLs by Mouth Rinse route every 2 (two) hours as needed for dry mouth.   atorvastatin 10 MG tablet Commonly known as:  LIPITOR Take 10 mg by mouth daily.   budesonide-formoterol 160-4.5 MCG/ACT inhaler Commonly known as:  SYMBICORT Inhale 2 puffs into the lungs 2 (two) times daily.   chlorpheniramine-HYDROcodone 10-8 MG/5ML Suer Commonly known as:  TUSSIONEX Take 5 mLs by mouth every 12 (twelve) hours.   citalopram 20 MG tablet Commonly known as:  CELEXA Take 20 mg by mouth daily.   dextromethorphan-guaiFENesin 10-100 MG/5ML liquid Commonly known as:  SILTUSSIN DM DAS Take 5 mLs by mouth every 4 (four) hours as needed for cough.   diltiazem 240 MG 24 hr capsule Commonly known as:  CARDIZEM CD Take 240 mg by mouth daily.   doxycycline 100 MG tablet Commonly known as:  ADOXA Take 1 tablet (100 mg total) by mouth 2 (two) times daily.   ELIQUIS 5 MG Tabs tablet Generic drug:  apixaban Take 5 mg by mouth 2 (two) times daily.   furosemide 40 MG tablet Commonly known as:  LASIX Take 60 mg by mouth every morning. What changed:  Another medication with the same name was removed. Continue taking this medication, and follow the directions you see  here.   gabapentin 600 MG tablet Commonly known as:  NEURONTIN Take 1 tablet (600 mg total) by mouth 2 (two) times daily. What changed:    how much to take  additional instructions   insulin glargine 100 UNIT/ML injection Commonly known as:  LANTUS Inject 0.28 mLs (28 Units total) into the skin daily. Start taking on:  06/28/2017   ipratropium-albuterol 0.5-2.5 (3) MG/3ML Soln Commonly known as:  DUONEB Take 3 mLs by nebulization 4 (four) times daily.   loratadine 10  MG tablet Commonly known as:  CLARITIN Take 10 mg by mouth every morning.   losartan 100 MG tablet Commonly known as:  COZAAR Take 100 mg by mouth daily.   Melatonin 5 MG Tabs Take 2 tablets (10 mg total) by mouth at bedtime.   montelukast 10 MG tablet Commonly known as:  SINGULAIR Take 1 tablet (10 mg total) by mouth at bedtime.   nystatin powder Generic drug:  nystatin Apply 1 Bottle topically 4 (four) times daily as needed (skin folds).   omeprazole 20 MG capsule Commonly known as:  PRILOSEC Take 20 mg by mouth daily.   polyethylene glycol packet Commonly known as:  MIRALAX / GLYCOLAX Take 17 g by mouth daily. What changed:    when to take this  reasons to take this   potassium chloride 10 MEQ tablet Commonly known as:  K-DUR,KLOR-CON Take 10 mEq by mouth daily.   predniSONE 10 MG tablet Commonly known as:  DELTASONE 60 mg tomorrow, taper by 10 mg daily until complete   senna 8.6 MG Tabs tablet Commonly known as:  SENOKOT Take 1 tablet (8.6 mg total) by mouth 2 (two) times daily.   simethicone 125 MG chewable tablet Commonly known as:  MYLICON Chew 125 mg by mouth every 6 (six) hours as needed for flatulence.   TOVIAZ 4 MG Tb24 tablet Generic drug:  fesoterodine Take 4 mg by mouth daily.   traMADol 50 MG tablet Commonly known as:  ULTRAM Take 50 mg by mouth 2 (two) times daily.   traMADol 50 MG tablet Commonly known as:  ULTRAM Take 50 mg by mouth daily as needed (midday  pain).   VICTOZA 18 MG/3ML Sopn Generic drug:  liraglutide Inject 1.2 mg into the skin daily.   VISINE 0.05 % ophthalmic solution Generic drug:  tetrahydrozoline Place 1 drop into both eyes 4 (four) times daily as needed (eye irritation).       Allergies  Allergen Reactions  . Other Shortness Of Breath    ROSEMARY AND HEAVY SCENTED PERFUMES, DETERGENTS, ETC  . Tape Itching and Rash    Burns and blisters USE PAPER TAPE  . Celebrex [Celecoxib] Other (See Comments)    HARD TIME HEARING, LIKE BEING UNDERWATER  . Latex Hives, Itching and Rash    Other reaction(s): RASH  . Mysoline [Primidone] Other (See Comments)    unknown  . Niacin And Related Hives and Swelling  . Sulfa Antibiotics Other (See Comments)    TONGUE BLISTERS    . Aspirin Other (See Comments)    GI PROBLEMS AND PAIN  . Trazodone And Nefazodone Swelling     Procedures/Studies:    Dg Chest 2 View  Result Date: 06/25/2017 CLINICAL DATA:  Fever and cough EXAM: CHEST - 2 VIEW COMPARISON:  06/24/2017 FINDINGS: Cardiac shadow is enlarged but accentuated by the frontal technique. The lungs are well aerated bilaterally. Increased density is noted in the medial right lung base consistent with right middle lobe infiltrate. Small effusions are noted posteriorly. IMPRESSION: Right middle lobe infiltrate. Electronically Signed   By: Alcide Clever M.D.   On: 06/25/2017 10:23   Dg Chest 2 View  Result Date: 06/24/2017 CLINICAL DATA:  Shortness of breath for 3 days. EXAM: CHEST - 2 VIEW COMPARISON:  04/04/2016. FINDINGS: Cardiomegaly. Mild vascular congestion. Slight retrocardiac density could represent atelectasis or developing consolidation. Small LEFT effusion. No pneumothorax. Aortic atherosclerosis. Bones unremarkable. IMPRESSION: Cardiomegaly. Slight retrocardiac density could represent atelectasis or developing consolidation. Electronically Signed  By: Elsie Stain M.D.   On: 06/24/2017 17:07   Dg Chest Port 1  View  Result Date: 06/26/2017 CLINICAL DATA:  Shortness of breath EXAM: PORTABLE CHEST 1 VIEW COMPARISON:  06/25/2017 FINDINGS: Cardiac shadow remains enlarged. The lungs are well aerated bilaterally. Improved aeration is noted in the right lung base. Some minimal residual density is noted medially. No other focal abnormality is seen. IMPRESSION: Significant improved aeration in the right middle lobe. Some mild residual infiltrate is noted. The previously seen changes may have been related to mucous plugging. Electronically Signed   By: Alcide Clever M.D.   On: 06/26/2017 08:21     The results of significant diagnostics from this hospitalization (including imaging, microbiology, ancillary and laboratory) are listed below for reference.     Microbiology: Recent Results (from the past 240 hour(s))  Respiratory Panel by PCR     Status: Abnormal   Collection Time: 06/25/17 12:15 AM  Result Value Ref Range Status   Adenovirus NOT DETECTED NOT DETECTED Final   Coronavirus 229E NOT DETECTED NOT DETECTED Final   Coronavirus HKU1 NOT DETECTED NOT DETECTED Final   Coronavirus NL63 NOT DETECTED NOT DETECTED Final   Coronavirus OC43 NOT DETECTED NOT DETECTED Final   Metapneumovirus NOT DETECTED NOT DETECTED Final   Rhinovirus / Enterovirus NOT DETECTED NOT DETECTED Final   Influenza A NOT DETECTED NOT DETECTED Final   Influenza B NOT DETECTED NOT DETECTED Final   Parainfluenza Virus 1 NOT DETECTED NOT DETECTED Final   Parainfluenza Virus 2 NOT DETECTED NOT DETECTED Final   Parainfluenza Virus 3 DETECTED (A) NOT DETECTED Final   Parainfluenza Virus 4 NOT DETECTED NOT DETECTED Final   Respiratory Syncytial Virus NOT DETECTED NOT DETECTED Final   Bordetella pertussis NOT DETECTED NOT DETECTED Final   Chlamydophila pneumoniae NOT DETECTED NOT DETECTED Final   Mycoplasma pneumoniae NOT DETECTED NOT DETECTED Final    Comment: Performed at [facility], [address], [place], [place] [address]      Labs: BNP (last 3 results) Recent Labs    06/24/17 1529  BNP 124.0*   Basic Metabolic Panel: Recent Labs  Lab 06/24/17 1529 06/25/17 0609 06/26/17 0614  NA 135 136 137  K 4.5 4.2 3.7  CL 99* 98 99  CO2 25 29 28   GLUCOSE 265* 294* 351*  BUN 27* 28* 33*  CREATININE 0.98 0.94 0.88  CALCIUM 9.0 9.2 9.3  MG  --  2.4 2.2  PHOS  --  3.7 3.5   Liver Function Tests: Recent Labs  Lab 06/24/17 1529 06/25/17 0609 06/26/17 0614  AST 19 18 22   ALT 20 20 22   ALKPHOS 81 78 75  BILITOT 0.5 0.4 0.5  PROT 6.9 6.9 6.9  ALBUMIN 3.7 3.4* 3.4*   No results for input(s): LIPASE, AMYLASE in the last 168 hours. No results for input(s): AMMONIA in the last 168 hours. CBC: Recent Labs  Lab 06/24/17 1529 06/25/17 0609 06/26/17 0614  WBC 7.7 6.8 12.6*  NEUTROABS 6.8  --  11.6*  HGB 10.9* 10.5* 11.0*  HCT 33.7* 32.4* 34.4*  MCV 89.4 89.0 89.6  PLT 255 256 274   Cardiac Enzymes: No results for input(s): CKTOTAL, CKMB, CKMBINDEX, TROPONINI in the last 168 hours. BNP: Invalid input(s): POCBNP CBG: Recent Labs  Lab 06/25/17 2137 06/26/17 0738 06/26/17 1132 06/26/17 1650 06/27/17 0748  GLUCAP 281* 327* 328* 284* 306*   D-Dimer No results for input(s): DDIMER in the last 72 hours. Hgb A1c  Recent Labs    06/25/17 0609  HGBA1C 7.1*   Lipid Profile No results for input(s): CHOL, HDL, LDLCALC, TRIG, CHOLHDL, LDLDIRECT in the last 72 hours. Thyroid function studies Recent Labs    06/24/17 1529  TSH 1.158   Anemia work up Recent Labs    06/26/17 0614  VITAMINB12 496  FOLATE 11.8  FERRITIN 39  TIBC 343  IRON 37  RETICCTPCT 2.1   Urinalysis    Component Value Date/Time   COLORURINE AMBER (A) 04/04/2016 1627   APPEARANCEUR CLOUDY (A) 04/04/2016 1627   LABSPEC 1.017 04/04/2016 1627   PHURINE 8.0 04/04/2016 1627   GLUCOSEU NEGATIVE 04/04/2016 1627   HGBUR NEGATIVE 04/04/2016 1627   BILIRUBINUR NEGATIVE 04/04/2016 1627   KETONESUR NEGATIVE 04/04/2016 1627    PROTEINUR 30 (A) 04/04/2016 1627   UROBILINOGEN 0.2 06/14/2014 0636   NITRITE POSITIVE (A) 04/04/2016 1627   LEUKOCYTESUR NEGATIVE 04/04/2016 1627   Sepsis Labs Invalid input(s): PROCALCITONIN,  WBC,  LACTICIDVEN Microbiology Recent Results (from the past 240 hour(s))  Respiratory Panel by PCR     Status: Abnormal   Collection Time: 06/25/17 12:15 AM  Result Value Ref Range Status   Adenovirus NOT DETECTED NOT DETECTED Final   Coronavirus 229E NOT DETECTED NOT DETECTED Final   Coronavirus HKU1 NOT DETECTED NOT DETECTED Final   Coronavirus NL63 NOT DETECTED NOT DETECTED Final   Coronavirus OC43 NOT DETECTED NOT DETECTED Final   Metapneumovirus NOT DETECTED NOT DETECTED Final   Rhinovirus / Enterovirus NOT DETECTED NOT DETECTED Final   Influenza A NOT DETECTED NOT DETECTED Final   Influenza B NOT DETECTED NOT DETECTED Final   Parainfluenza Virus 1 NOT DETECTED NOT DETECTED Final   Parainfluenza Virus 2 NOT DETECTED NOT DETECTED Final   Parainfluenza Virus 3 DETECTED (A) NOT DETECTED Final   Parainfluenza Virus 4 NOT DETECTED NOT DETECTED Final   Respiratory Syncytial Virus NOT DETECTED NOT DETECTED Final   Bordetella pertussis NOT DETECTED NOT DETECTED Final   Chlamydophila pneumoniae NOT DETECTED NOT DETECTED Final   Mycoplasma pneumoniae NOT DETECTED NOT DETECTED Final    Comment: Performed at [facility], [address] [place], [place] [address]     Time coordinating discharge in minutes: 65  SIGNED:   Calvert Cantor, MD  [facility] 06/27/2017, 10:11 AM Pager   If 7PM-7AM, please contact night-coverage [email or web address] Password [email or web address]

## 2017-06-27 NOTE — Care Management Important Message (Signed)
Important Message  Patient Details  Name: Elaine BrickMelanie K Kirt MRN: [id number] Date of Birth: October 28, 1945   Medicare Important Message Given:  Yes    Caren MacadamFuller, Camauri Craton 06/27/2017, 11:17 AMImportant Message  Patient Details  Name: Elaine BrickMelanie K Trego MRN: [id number] Date of Birth: October 28, 1945   Medicare Important Message Given:  Yes    Caren MacadamFuller, Krithi Bray 06/27/2017, 11:17 AM

## 2017-06-28 LAB — GLUCOSE, CAPILLARY: Glucose-Capillary: 287 mg/dL — ABNORMAL HIGH (ref 70–99)

## 2017-08-01 ENCOUNTER — Other Ambulatory Visit: Payer: Self-pay | Admitting: Internal Medicine

## 2017-08-01 DIAGNOSIS — Z1231 Encounter for screening mammogram for malignant neoplasm of breast: Secondary | ICD-10-CM

## 2017-09-09 ENCOUNTER — Ambulatory Visit
Admission: RE | Admit: 2017-09-09 | Discharge: 2017-09-09 | Disposition: A | Discharge status: Home / Self Care | Payer: Medicare (Managed Care) | Source: Ambulatory Visit | Attending: Internal Medicine | Admitting: Internal Medicine

## 2017-09-09 DIAGNOSIS — Z1231 Encounter for screening mammogram for malignant neoplasm of breast: Secondary | ICD-10-CM

## 2017-11-14 NOTE — Progress Notes (Signed)
Please place orders in Epic as patient is being scheduled for a pre-op appointment! Thank you! 

## 2017-11-15 ENCOUNTER — Ambulatory Visit: Payer: Self-pay | Admitting: Orthopedic Surgery

## 2017-11-22 ENCOUNTER — Other Ambulatory Visit: Payer: Self-pay | Admitting: Nurse Practitioner

## 2017-11-22 ENCOUNTER — Ambulatory Visit: Payer: Self-pay | Admitting: Orthopedic Surgery

## 2017-11-22 ENCOUNTER — Ambulatory Visit
Admission: RE | Admit: 2017-11-22 | Discharge: 2017-11-22 | Disposition: A | Discharge status: Home / Self Care | Payer: Medicare (Managed Care) | Source: Ambulatory Visit | Attending: Nurse Practitioner | Admitting: Nurse Practitioner

## 2017-11-22 DIAGNOSIS — R053 Chronic cough: Secondary | ICD-10-CM

## 2017-11-22 DIAGNOSIS — R05 Cough: Secondary | ICD-10-CM

## 2017-11-22 NOTE — H&P (View-Only) (Signed)
TOTAL KNEE ADMISSION H&P  Patient is being admitted for right total knee arthroplasty.  Subjective:  Chief Complaint:right knee pain.  HPI: Elaine Lee, 72 y.o. female, has a history of pain and functional disability in the right knee due to arthritis and has failed non-surgical conservative treatments for greater than 12 weeks to includeNSAID's and/or analgesics, corticosteriod injections, viscosupplementation injections, flexibility and strengthening excercises, use of assistive devices, weight reduction as appropriate and activity modification.  Onset of symptoms was gradual, starting >10 years ago with gradually worsening course since that time. The patient noted no past surgery on the right knee(s).  Patient currently rates pain in the right knee(s) at 10 out of 10 with activity. Patient has night pain, worsening of pain with activity and weight bearing, pain that interferes with activities of daily living, pain with passive range of motion, crepitus and joint swelling.  Patient has evidence of subchondral cysts, subchondral sclerosis, periarticular osteophytes and joint space narrowing by imaging studies.  There is no active infection.  Patient Active Problem List   Diagnosis Date Noted  . Infection due to parainfluenza virus 3 06/27/2017  . Atrial fibrillation with RVR (HCC) 06/24/2017  . UTI (urinary tract infection) 04/05/2016  . Pleuritic chest pain 04/05/2016  . AKI (acute kidney injury) (HCC) 04/05/2016  . Tremor 04/05/2016  . Fall   . Asthma flare 10/25/2014  . OSA (obstructive sleep apnea) 10/25/2014  . Morbid obesity (HCC) 10/25/2014  . Acute on chronic respiratory failure with hypoxia (HCC)   . DM2 (diabetes mellitus, type 2) (HCC) 06/14/2014  . CKD (chronic kidney disease), stage III (HCC) 06/14/2014  . Neuropathic pain 06/14/2014  . HTN (hypertension) 06/14/2014  . Type 2 diabetes with stage 3 chronic kidney disease GFR 30-59 (HCC)   . COPD exacerbation (HCC)  06/13/2014  . Acute exacerbation of CHF (congestive heart failure) (HCC) 06/13/2014   Past Medical History:  Diagnosis Date  . A-fib (HCC)   . Asthma   . CHF (congestive heart failure) (HCC)   . Chronic edema   . CKD (chronic kidney disease), stage III (HCC)   . COPD (chronic obstructive pulmonary disease) (HCC)   . Degenerative lumbar disc   . Depression   . Diabetes mellitus without complication (HCC)   . Diabetic neuropathy (HCC)   . GERD (gastroesophageal reflux disease)   . Gout   . Hearing loss   . Hypercholesteremia   . Hypertension   . Insomnia   . Obstructive sleep apnea on CPAP   . Osteoarthritis    knees and shoulders  . Renal disorder   . Urinary frequency     Past Surgical History:  Procedure Laterality Date  . ABDOMINAL HYSTERECTOMY      Current Outpatient Medications  Medication Sig Dispense Refill Last Dose  . acetaminophen (TYLENOL) 325 MG tablet Take 2 tablets (650 mg total) by mouth every 8 (eight) hours as needed.   06/24/2017 at Unknown time  . albuterol (PROVENTIL HFA;VENTOLIN HFA) 108 (90 BASE) MCG/ACT inhaler Inhale 2 puffs into the lungs every 6 (six) hours as needed for wheezing or shortness of breath.   06/24/2017 at Unknown time  . antiseptic oral rinse (BIOTENE) LIQD 15 mLs by Mouth Rinse route every 2 (two) hours as needed for dry mouth.    Past Week at Unknown time  . apixaban (ELIQUIS) 5 MG TABS tablet Take 5 mg by mouth 2 (two) times daily.   06/24/2017 at 730  . atorvastatin (LIPITOR) 10 MG tablet  Take 10 mg by mouth daily.   06/24/2017 at Unknown time  . budesonide-formoterol (SYMBICORT) 160-4.5 MCG/ACT inhaler Inhale 2 puffs into the lungs 2 (two) times daily.   06/24/2017 at Unknown time  . chlorpheniramine-HYDROcodone (TUSSIONEX) 10-8 MG/5ML SUER Take 5 mLs by mouth every 12 (twelve) hours. 115 mL 0   . citalopram (CELEXA) 20 MG tablet Take 20 mg by mouth daily.   06/24/2017 at Unknown time  . dextromethorphan-guaiFENesin (SILTUSSIN DM DAS)  10-100 MG/5ML liquid Take 5 mLs by mouth every 4 (four) hours as needed for cough. 355 mL 0   . diltiazem (CARDIZEM CD) 240 MG 24 hr capsule Take 240 mg by mouth daily.   06/24/2017 at Unknown time  . doxycycline (ADOXA) 100 MG tablet Take 1 tablet (100 mg total) by mouth 2 (two) times daily. 3 tablet 0   . fesoterodine (TOVIAZ) 4 MG TB24 tablet Take 4 mg by mouth daily.    06/24/2017 at Unknown time  . furosemide (LASIX) 40 MG tablet Take 60 mg by mouth every morning.    06/24/2017 at Unknown time  . gabapentin (NEURONTIN) 600 MG tablet Take 1 tablet (600 mg total) by mouth 2 (two) times daily. (Patient taking differently: Take 600-1,200 mg by mouth 2 (two) times daily. Take 1 tablet in the am and Take 2 tablets in the evening.) 60 tablet 2 06/24/2017 at Unknown time  . insulin glargine (LANTUS) 100 UNIT/ML injection Inject 0.28 mLs (28 Units total) into the skin daily. 10 mL 11   . ipratropium-albuterol (DUONEB) 0.5-2.5 (3) MG/3ML SOLN Take 3 mLs by nebulization 4 (four) times daily. 300 mL 0   . liraglutide (VICTOZA) 18 MG/3ML SOPN Inject 1.2 mg into the skin daily.    06/24/2017 at Unknown time  . loratadine (CLARITIN) 10 MG tablet Take 10 mg by mouth every morning.   06/24/2017 at Unknown time  . losartan (COZAAR) 100 MG tablet Take 100 mg by mouth daily.   06/24/2017 at Unknown time  . Melatonin 5 MG TABS Take 2 tablets (10 mg total) by mouth at bedtime.  0   . montelukast (SINGULAIR) 10 MG tablet Take 1 tablet (10 mg total) by mouth at bedtime. 30 tablet 5 06/23/2017 at Unknown time  . nystatin (NYSTATIN) powder Apply 1 Bottle topically 4 (four) times daily as needed (skin folds).   06/23/2017 at Unknown time  . omeprazole (PRILOSEC) 20 MG capsule Take 20 mg by mouth daily.   06/24/2017 at Unknown time  . polyethylene glycol (MIRALAX / GLYCOLAX) packet Take 17 g by mouth daily. (Patient taking differently: Take 17 g by mouth daily as needed for moderate constipation. ) 14 each 0 unknown  . potassium  chloride (K-DUR,KLOR-CON) 10 MEQ tablet Take 10 mEq by mouth daily.   06/24/2017 at Unknown time  . predniSONE (DELTASONE) 10 MG tablet 60 mg tomorrow, taper by 10 mg daily until complete 21 tablet 0   . senna (SENOKOT) 8.6 MG TABS tablet Take 1 tablet (8.6 mg total) by mouth 2 (two) times daily. 120 each 0   . simethicone (MYLICON) 125 MG chewable tablet Chew 125 mg by mouth every 6 (six) hours as needed for flatulence.   unknown  . tetrahydrozoline (VISINE) 0.05 % ophthalmic solution Place 1 drop into both eyes 4 (four) times daily as needed (eye irritation).     . traMADol (ULTRAM) 50 MG tablet Take 50 mg by mouth 2 (two) times daily.    06/24/2017 at Unknown time  .  traMADol (ULTRAM) 50 MG tablet Take 50 mg by mouth daily as needed (midday pain).   06/24/2017 at Unknown time   No current facility-administered medications for this visit.    Allergies  Allergen Reactions  . Other Shortness Of Breath    ROSEMARY AND HEAVY SCENTED PERFUMES, DETERGENTS, ETC  . Tape Itching and Rash    Burns and blisters USE PAPER TAPE  . Celebrex [Celecoxib] Other (See Comments)    HARD TIME HEARING, LIKE BEING UNDERWATER  . Latex Hives, Itching and Rash    Other reaction(s): RASH  . Mysoline [Primidone] Other (See Comments)    unknown  . Niacin And Related Hives and Swelling  . Sulfa Antibiotics Other (See Comments)    TONGUE BLISTERS    . Aspirin Other (See Comments)    GI PROBLEMS AND PAIN  . Trazodone And Nefazodone Swelling    Social History   Tobacco Use  . Smoking status: Never Smoker  . Smokeless tobacco: Never Used  Substance Use Topics  . Alcohol use: No    Alcohol/week: 0.0 standard drinks    Family History  Problem Relation Age of Onset  . Allergies Mother   . Rheumatologic disease Mother   . Cancer Mother   . Allergies Father   . Asthma Father   . Heart disease Father   . Rheumatologic disease Father   . Cancer Father   . Allergies Daughter   . Cancer Sister   . Breast  cancer Maternal Aunt   . Breast cancer Paternal Aunt   . Breast cancer Maternal Aunt      Review of Systems  Constitutional: Positive for malaise/fatigue.  HENT: Positive for hearing loss and tinnitus.   Eyes: Negative.   Respiratory: Positive for shortness of breath.   Cardiovascular: Positive for leg swelling.  Gastrointestinal: Positive for constipation and heartburn.  Genitourinary: Positive for frequency and urgency.  Musculoskeletal: Positive for back pain and joint pain.  Skin: Negative.   Neurological: Negative.   Endo/Heme/Allergies: Positive for environmental allergies.  Psychiatric/Behavioral: Negative.     Objective:  Physical Exam  Vitals reviewed. Constitutional: She is oriented to person, place, and time. She appears well-developed and well-nourished.  HENT:  Head: Normocephalic and atraumatic.  Eyes: Pupils are equal, round, and reactive to light. Conjunctivae and EOM are normal.  Neck: Normal range of motion. No thyromegaly present.  Cardiovascular: Normal rate, regular rhythm and intact distal pulses.  Respiratory: Effort normal. No respiratory distress.  GI: Soft. She exhibits no distension.  Genitourinary:  Genitourinary Comments: deferred  Musculoskeletal:       Right knee: She exhibits decreased range of motion, swelling and abnormal alignment. Tenderness found. Medial joint line and lateral joint line tenderness noted.  Neurological: She is alert and oriented to person, place, and time. She has normal reflexes.  Skin: Skin is warm and dry.  Psychiatric: She has a normal mood and affect. Her behavior is normal. Judgment and thought content normal.    Vital signs in last 24 hours: @VSRANGES @  Labs:   Estimated body mass index is 41.38 kg/m as calculated from the following:   Height as of 06/24/17: 5\' 5"  (1.651 m).   Weight as of 06/27/17: 112.8 kg.   Imaging Review Plain radiographs demonstrate severe degenerative joint disease of the right  knee(s). The overall alignment issignificant valgus. The bone quality appears to be adequate for age and reported activity level.   Preoperative templating of the joint replacement has been completed, documented, and  submitted to the Operating Room personnel in order to optimize intra-operative equipment management.   Anticipated LOS equal to or greater than 2 midnights due to - Age 74 and older with one or more of the following:  - Obesity  - Expected need for hospital services (PT, OT, Nursing) required for safe  discharge  - Anticipated need for postoperative skilled nursing care or inpatient rehab  - Active co-morbidities: Diabetes OR   - Unanticipated findings during/Post Surgery: None  - Patient is a high risk of re-admission due to: None     Assessment/Plan:  End stage arthritis, right knee   The patient history, physical examination, clinical judgment of the provider and imaging studies are consistent with end stage degenerative joint disease of the right knee(s) and total knee arthroplasty is deemed medically necessary. The treatment options including medical management, injection therapy arthroscopy and arthroplasty were discussed at length. The risks and benefits of total knee arthroplasty were presented and reviewed. The risks due to aseptic loosening, infection, stiffness, patella tracking problems, thromboembolic complications and other imponderables were discussed. The patient acknowledged the explanation, agreed to proceed with the plan and consent was signed. Patient is being admitted for inpatient treatment for surgery, pain control, PT, OT, prophylactic antibiotics, VTE prophylaxis, progressive ambulation and ADL's and discharge planning. The patient is planning to be discharged home with outpatient PT. Has DME.

## 2017-11-22 NOTE — H&P (Signed)
TOTAL KNEE ADMISSION H&P  Patient is being admitted for right total knee arthroplasty.  Subjective:  Chief Complaint:right knee pain.  HPI: Elaine Lee, 72 y.o. female, has a history of pain and functional disability in the right knee due to arthritis and has failed non-surgical conservative treatments for greater than 12 weeks to includeNSAID's and/or analgesics, corticosteriod injections, viscosupplementation injections, flexibility and strengthening excercises, use of assistive devices, weight reduction as appropriate and activity modification.  Onset of symptoms was gradual, starting >10 years ago with gradually worsening course since that time. The patient noted no past surgery on the right knee(s).  Patient currently rates pain in the right knee(s) at 10 out of 10 with activity. Patient has night pain, worsening of pain with activity and weight bearing, pain that interferes with activities of daily living, pain with passive range of motion, crepitus and joint swelling.  Patient has evidence of subchondral cysts, subchondral sclerosis, periarticular osteophytes and joint space narrowing by imaging studies.  There is no active infection.  Patient Active Problem List   Diagnosis Date Noted  . Infection due to parainfluenza virus 3 06/27/2017  . Atrial fibrillation with RVR (HCC) 06/24/2017  . UTI (urinary tract infection) 04/05/2016  . Pleuritic chest pain 04/05/2016  . AKI (acute kidney injury) (HCC) 04/05/2016  . Tremor 04/05/2016  . Fall   . Asthma flare 10/25/2014  . OSA (obstructive sleep apnea) 10/25/2014  . Morbid obesity (HCC) 10/25/2014  . Acute on chronic respiratory failure with hypoxia (HCC)   . DM2 (diabetes mellitus, type 2) (HCC) 06/14/2014  . CKD (chronic kidney disease), stage III (HCC) 06/14/2014  . Neuropathic pain 06/14/2014  . HTN (hypertension) 06/14/2014  . Type 2 diabetes with stage 3 chronic kidney disease GFR 30-59 (HCC)   . COPD exacerbation (HCC)  06/13/2014  . Acute exacerbation of CHF (congestive heart failure) (HCC) 06/13/2014   Past Medical History:  Diagnosis Date  . A-fib (HCC)   . Asthma   . CHF (congestive heart failure) (HCC)   . Chronic edema   . CKD (chronic kidney disease), stage III (HCC)   . COPD (chronic obstructive pulmonary disease) (HCC)   . Degenerative lumbar disc   . Depression   . Diabetes mellitus without complication (HCC)   . Diabetic neuropathy (HCC)   . GERD (gastroesophageal reflux disease)   . Gout   . Hearing loss   . Hypercholesteremia   . Hypertension   . Insomnia   . Obstructive sleep apnea on CPAP   . Osteoarthritis    knees and shoulders  . Renal disorder   . Urinary frequency     Past Surgical History:  Procedure Laterality Date  . ABDOMINAL HYSTERECTOMY      Current Outpatient Medications  Medication Sig Dispense Refill Last Dose  . acetaminophen (TYLENOL) 325 MG tablet Take 2 tablets (650 mg total) by mouth every 8 (eight) hours as needed.   06/24/2017 at Unknown time  . albuterol (PROVENTIL HFA;VENTOLIN HFA) 108 (90 BASE) MCG/ACT inhaler Inhale 2 puffs into the lungs every 6 (six) hours as needed for wheezing or shortness of breath.   06/24/2017 at Unknown time  . antiseptic oral rinse (BIOTENE) LIQD 15 mLs by Mouth Rinse route every 2 (two) hours as needed for dry mouth.    Past Week at Unknown time  . apixaban (ELIQUIS) 5 MG TABS tablet Take 5 mg by mouth 2 (two) times daily.   06/24/2017 at 730  . atorvastatin (LIPITOR) 10 MG tablet  Take 10 mg by mouth daily.   06/24/2017 at Unknown time  . budesonide-formoterol (SYMBICORT) 160-4.5 MCG/ACT inhaler Inhale 2 puffs into the lungs 2 (two) times daily.   06/24/2017 at Unknown time  . chlorpheniramine-HYDROcodone (TUSSIONEX) 10-8 MG/5ML SUER Take 5 mLs by mouth every 12 (twelve) hours. 115 mL 0   . citalopram (CELEXA) 20 MG tablet Take 20 mg by mouth daily.   06/24/2017 at Unknown time  . dextromethorphan-guaiFENesin (SILTUSSIN DM DAS)  10-100 MG/5ML liquid Take 5 mLs by mouth every 4 (four) hours as needed for cough. 355 mL 0   . diltiazem (CARDIZEM CD) 240 MG 24 hr capsule Take 240 mg by mouth daily.   06/24/2017 at Unknown time  . doxycycline (ADOXA) 100 MG tablet Take 1 tablet (100 mg total) by mouth 2 (two) times daily. 3 tablet 0   . fesoterodine (TOVIAZ) 4 MG TB24 tablet Take 4 mg by mouth daily.    06/24/2017 at Unknown time  . furosemide (LASIX) 40 MG tablet Take 60 mg by mouth every morning.    06/24/2017 at Unknown time  . gabapentin (NEURONTIN) 600 MG tablet Take 1 tablet (600 mg total) by mouth 2 (two) times daily. (Patient taking differently: Take 600-1,200 mg by mouth 2 (two) times daily. Take 1 tablet in the am and Take 2 tablets in the evening.) 60 tablet 2 06/24/2017 at Unknown time  . insulin glargine (LANTUS) 100 UNIT/ML injection Inject 0.28 mLs (28 Units total) into the skin daily. 10 mL 11   . ipratropium-albuterol (DUONEB) 0.5-2.5 (3) MG/3ML SOLN Take 3 mLs by nebulization 4 (four) times daily. 300 mL 0   . liraglutide (VICTOZA) 18 MG/3ML SOPN Inject 1.2 mg into the skin daily.    06/24/2017 at Unknown time  . loratadine (CLARITIN) 10 MG tablet Take 10 mg by mouth every morning.   06/24/2017 at Unknown time  . losartan (COZAAR) 100 MG tablet Take 100 mg by mouth daily.   06/24/2017 at Unknown time  . Melatonin 5 MG TABS Take 2 tablets (10 mg total) by mouth at bedtime.  0   . montelukast (SINGULAIR) 10 MG tablet Take 1 tablet (10 mg total) by mouth at bedtime. 30 tablet 5 06/23/2017 at Unknown time  . nystatin (NYSTATIN) powder Apply 1 Bottle topically 4 (four) times daily as needed (skin folds).   06/23/2017 at Unknown time  . omeprazole (PRILOSEC) 20 MG capsule Take 20 mg by mouth daily.   06/24/2017 at Unknown time  . polyethylene glycol (MIRALAX / GLYCOLAX) packet Take 17 g by mouth daily. (Patient taking differently: Take 17 g by mouth daily as needed for moderate constipation. ) 14 each 0 unknown  . potassium  chloride (K-DUR,KLOR-CON) 10 MEQ tablet Take 10 mEq by mouth daily.   06/24/2017 at Unknown time  . predniSONE (DELTASONE) 10 MG tablet 60 mg tomorrow, taper by 10 mg daily until complete 21 tablet 0   . senna (SENOKOT) 8.6 MG TABS tablet Take 1 tablet (8.6 mg total) by mouth 2 (two) times daily. 120 each 0   . simethicone (MYLICON) 125 MG chewable tablet Chew 125 mg by mouth every 6 (six) hours as needed for flatulence.   unknown  . tetrahydrozoline (VISINE) 0.05 % ophthalmic solution Place 1 drop into both eyes 4 (four) times daily as needed (eye irritation).     . traMADol (ULTRAM) 50 MG tablet Take 50 mg by mouth 2 (two) times daily.    06/24/2017 at Unknown time  .  traMADol (ULTRAM) 50 MG tablet Take 50 mg by mouth daily as needed (midday pain).   06/24/2017 at Unknown time   No current facility-administered medications for this visit.    Allergies  Allergen Reactions  . Other Shortness Of Breath    ROSEMARY AND HEAVY SCENTED PERFUMES, DETERGENTS, ETC  . Tape Itching and Rash    Burns and blisters USE PAPER TAPE  . Celebrex [Celecoxib] Other (See Comments)    HARD TIME HEARING, LIKE BEING UNDERWATER  . Latex Hives, Itching and Rash    Other reaction(s): RASH  . Mysoline [Primidone] Other (See Comments)    unknown  . Niacin And Related Hives and Swelling  . Sulfa Antibiotics Other (See Comments)    TONGUE BLISTERS    . Aspirin Other (See Comments)    GI PROBLEMS AND PAIN  . Trazodone And Nefazodone Swelling    Social History   Tobacco Use  . Smoking status: Never Smoker  . Smokeless tobacco: Never Used  Substance Use Topics  . Alcohol use: No    Alcohol/week: 0.0 standard drinks    Family History  Problem Relation Age of Onset  . Allergies Mother   . Rheumatologic disease Mother   . Cancer Mother   . Allergies Father   . Asthma Father   . Heart disease Father   . Rheumatologic disease Father   . Cancer Father   . Allergies Daughter   . Cancer Sister   . Breast  cancer Maternal Aunt   . Breast cancer Paternal Aunt   . Breast cancer Maternal Aunt      Review of Systems  Constitutional: Positive for malaise/fatigue.  HENT: Positive for hearing loss and tinnitus.   Eyes: Negative.   Respiratory: Positive for shortness of breath.   Cardiovascular: Positive for leg swelling.  Gastrointestinal: Positive for constipation and heartburn.  Genitourinary: Positive for frequency and urgency.  Musculoskeletal: Positive for back pain and joint pain.  Skin: Negative.   Neurological: Negative.   Endo/Heme/Allergies: Positive for environmental allergies.  Psychiatric/Behavioral: Negative.     Objective:  Physical Exam  Vitals reviewed. Constitutional: She is oriented to person, place, and time. She appears well-developed and well-nourished.  HENT:  Head: Normocephalic and atraumatic.  Eyes: Pupils are equal, round, and reactive to light. Conjunctivae and EOM are normal.  Neck: Normal range of motion. No thyromegaly present.  Cardiovascular: Normal rate, regular rhythm and intact distal pulses.  Respiratory: Effort normal. No respiratory distress.  GI: Soft. She exhibits no distension.  Genitourinary:  Genitourinary Comments: deferred  Musculoskeletal:       Right knee: She exhibits decreased range of motion, swelling and abnormal alignment. Tenderness found. Medial joint line and lateral joint line tenderness noted.  Neurological: She is alert and oriented to person, place, and time. She has normal reflexes.  Skin: Skin is warm and dry.  Psychiatric: She has a normal mood and affect. Her behavior is normal. Judgment and thought content normal.    Vital signs in last 24 hours: @VSRANGES @  Labs:   Estimated body mass index is 41.38 kg/m as calculated from the following:   Height as of 06/24/17: 5\' 5"  (1.651 m).   Weight as of 06/27/17: 112.8 kg.   Imaging Review Plain radiographs demonstrate severe degenerative joint disease of the right  knee(s). The overall alignment issignificant valgus. The bone quality appears to be adequate for age and reported activity level.   Preoperative templating of the joint replacement has been completed, documented, and  submitted to the Operating Room personnel in order to optimize intra-operative equipment management.   Anticipated LOS equal to or greater than 2 midnights due to - Age 74 and older with one or more of the following:  - Obesity  - Expected need for hospital services (PT, OT, Nursing) required for safe  discharge  - Anticipated need for postoperative skilled nursing care or inpatient rehab  - Active co-morbidities: Diabetes OR   - Unanticipated findings during/Post Surgery: None  - Patient is a high risk of re-admission due to: None     Assessment/Plan:  End stage arthritis, right knee   The patient history, physical examination, clinical judgment of the provider and imaging studies are consistent with end stage degenerative joint disease of the right knee(s) and total knee arthroplasty is deemed medically necessary. The treatment options including medical management, injection therapy arthroscopy and arthroplasty were discussed at length. The risks and benefits of total knee arthroplasty were presented and reviewed. The risks due to aseptic loosening, infection, stiffness, patella tracking problems, thromboembolic complications and other imponderables were discussed. The patient acknowledged the explanation, agreed to proceed with the plan and consent was signed. Patient is being admitted for inpatient treatment for surgery, pain control, PT, OT, prophylactic antibiotics, VTE prophylaxis, progressive ambulation and ADL's and discharge planning. The patient is planning to be discharged home with outpatient PT. Has DME.

## 2017-11-26 NOTE — Progress Notes (Signed)
  11-24-17 (Epic) CXR  10-30-17 Surgical Clearance from Dr. Charissa BashJulie Williams on chart  06-24-17 (Epic ) EKG  04-16-16 (Epic) ECHO

## 2017-11-26 NOTE — Patient Instructions (Addendum)
Elaine Lee  11/26/2017   Your procedure is scheduled on: 12-05-17    Report to [facility] Main  Entrance    Report to Admitting at 8:15 AM    Call this number if you have problems the morning of surgery [phone]    Remember: Do not eat food or drink liquids :After Midnight.     BRUSH YOUR TEETH MORNING OF SURGERY AND RINSE YOUR MOUTH OUT, NO CHEWING GUM CANDY OR MINTS.     Take these medicines the morning of surgery with A SIP OF WATER: Citalopram (Celexa), Diltiazem (Cardizem), Gabapentin (Neurontin), Loratadine (Claritin),  and Ranitidine (Zantac). You may also use and bring your inhaler and eyedrops   DO NOT TAKE ANY DIABETIC MEDICATIONS DAY OF YOUR SURGERY                               You may not have any metal on your body including hair pins and              piercings  Do not wear jewelry, make-up, lotions, powders or perfumes, deodorant             Do not wear nail polish.  Do not shave  48 hours prior to surgery.               Do not bring valuables to the hospital. [facility] IS NOT             RESPONSIBLE   FOR VALUABLES.  Contacts, dentures or bridgework may not be worn into surgery.  Leave suitcase in the car. After surgery it may be brought to your room.     Patients discharged the day of surgery will not be allowed to drive home.  Name and phone number of your driver:  Special Instructions: N/A              Please read over the following fact sheets you were given: _____________________________________________________________________  How to Manage Your Diabetes Before and After Surgery  Why is it important to control my blood sugar before and after surgery? . Improving blood sugar levels before and after surgery helps healing and can limit problems. . A way of improving blood sugar control is eating a healthy diet by: o  Eating less sugar and carbohydrates o  Increasing activity/exercise o  Talking with your doctor  about reaching your blood sugar goals . High blood sugars (greater than 180 mg/dL) can raise your risk of infections and slow your recovery, so you will need to focus on controlling your diabetes during the weeks before surgery. . Make sure that the doctor who takes care of your diabetes knows about your planned surgery including the date and location.  How do I manage my blood sugar before surgery? . Check your blood sugar at least 4 times a day, starting 2 days before surgery, to make sure that the level is not too high or low. o Check your blood sugar the morning of your surgery when you wake up and every 2 hours until you get to the Short Stay unit. . If your blood sugar is less than 70 mg/dL, you will need to treat for low blood sugar: o Do not take insulin. o Treat a low blood sugar (less than 70 mg/dL) with  cup of clear juice (cranberry or  apple), 4 glucose tablets, OR glucose gel. o Recheck blood sugar in 15 minutes after treatment (to make sure it is greater than 70 mg/dL). If your blood sugar is not greater than 70 mg/dL on recheck, call [phone] for further instructions. . Report your blood sugar to the short stay nurse when you get to Short Stay.  . If you are admitted to the hospital after surgery: o Your blood sugar will be checked by the staff and you will probably be given insulin after surgery (instead of oral diabetes medicines) to make sure you have good blood sugar levels. o The goal for blood sugar control after surgery is 80-180 mg/dL.   WHAT DO I DO ABOUT MY DIABETES MEDICATION?  Marland Kitchen Do not take oral diabetes medicines (pills) the morning of surgery.  . THE DAY BEFORE SURGERY,  take  6 units of Glargine insulin, and your 1.51ml of Victoza  . The day of surgery, do not take other diabetes injectables, including Byetta (exenatide), Bydureon (exenatide ER), Victoza (liraglutide), or Trulicity (dulaglutide).  . If your CBG is greater than 220 mg/dL, you may take  of  your sliding scale  . (correction) dose of insulin.     Reviewed and Endorsed by [facility] Patient Education Committee, August 2015           [facility] - Preparing for Surgery Before surgery, you can play an important role.  Because skin is not sterile, your skin needs to be as free of germs as possible.  You can reduce the number of germs on your skin by washing with CHG (chlorahexidine gluconate) soap before surgery.  CHG is an antiseptic cleaner which kills germs and bonds with the skin to continue killing germs even after washing. Please DO NOT use if you have an allergy to CHG or antibacterial soaps.  If your skin becomes reddened/irritated stop using the CHG and inform your nurse when you arrive at Short Stay. Do not shave (including legs and underarms) for at least 48 hours prior to the first CHG shower.  You may shave your face/neck. Please follow these instructions carefully:  1.  Shower with CHG Soap the night before surgery and the  morning of Surgery.  2.  If you choose to wash your hair, wash your hair first as usual with your  normal  shampoo.  3.  After you shampoo, rinse your hair and body thoroughly to remove the  shampoo.                           4.  Use CHG as you would any other liquid soap.  You can apply chg directly  to the skin and wash                       Gently with a scrungie or clean washcloth.  5.  Apply the CHG Soap to your body ONLY FROM THE NECK DOWN.   Do not use on face/ open                           Wound or open sores. Avoid contact with eyes, ears mouth and genitals (private parts).                       Wash face,  Genitals (private parts) with your normal soap.  6.  Wash thoroughly, paying special attention to the area where your surgery  will be performed.  7.  Thoroughly rinse your body with warm water from the neck down.  8.  DO NOT shower/wash with your normal soap after using and rinsing off  the CHG Soap.                9.  Pat  yourself dry with a clean towel.            10.  Wear clean pajamas.            11.  Place clean sheets on your bed the night of your first shower and do not  sleep with pets. Day of Surgery : Do not apply any lotions/deodorants the morning of surgery.  Please wear clean clothes to the hospital/surgery center.  FAILURE TO FOLLOW THESE INSTRUCTIONS MAY RESULT IN THE CANCELLATION OF YOUR SURGERY PATIENT SIGNATURE_________________________________  NURSE SIGNATURE__________________________________  ________________________________________________________________________   Rogelia MireIncentive Spirometer  An incentive spirometer is a tool that can help keep your lungs clear and active. This tool measures how well you are filling your lungs with each breath. Taking long deep breaths may help reverse or decrease the chance of developing breathing (pulmonary) problems (especially infection) following:  A long period of time when you are unable to move or be active. BEFORE THE PROCEDURE   If the spirometer includes an indicator to show your best effort, your nurse or respiratory therapist will set it to a desired goal.  If possible, sit up straight or lean slightly forward. Try not to slouch.  Hold the incentive spirometer in an upright position. INSTRUCTIONS FOR USE  1. Sit on the edge of your bed if possible, or sit up as far as you can in bed or on a chair. 2. Hold the incentive spirometer in an upright position. 3. Breathe out normally. 4. Place the mouthpiece in your mouth and seal your lips tightly around it. 5. Breathe in slowly and as deeply as possible, raising the piston or the ball toward the top of the column. 6. Hold your breath for 3-5 seconds or for as long as possible. Allow the piston or ball to fall to the bottom of the column. 7. Remove the mouthpiece from your mouth and breathe out normally. 8. Rest for a few seconds and repeat Steps 1 through 7 at least 10 times every 1-2 hours  when you are awake. Take your time and take a few normal breaths between deep breaths. 9. The spirometer may include an indicator to show your best effort. Use the indicator as a goal to work toward during each repetition. 10. After each set of 10 deep breaths, practice coughing to be sure your lungs are clear. If you have an incision (the cut made at the time of surgery), support your incision when coughing by placing a pillow or rolled up towels firmly against it. Once you are able to get out of bed, walk around indoors and cough well. You may stop using the incentive spirometer when instructed by your caregiver.  RISKS AND COMPLICATIONS  Take your time so you do not get dizzy or light-headed.  If you are in pain, you may need to take or ask for pain medication before doing incentive spirometry. It is harder to take a deep breath if you are having pain. AFTER USE  Rest and breathe slowly and easily.  It can be helpful to keep track of a log of  your progress. Your caregiver can provide you with a simple table to help with this. If you are using the spirometer at home, follow these instructions: [place] IF:   You are having difficultly using the spirometer.  You have trouble using the spirometer as often as instructed.  Your pain medication is not giving enough relief while using the spirometer.  You develop fever of 100.5 F (38.1 C) or higher. SEEK IMMEDIATE MEDICAL CARE IF:   You cough up bloody sputum that had not been present before.  You develop fever of 102 F (38.9 C) or greater.  You develop worsening pain at or near the incision site. MAKE SURE YOU:   Understand these instructions.  Will watch your condition.  Will get help right away if you are not doing well or get worse. Document Released: 04/30/2006 Document Revised: 03/12/2011 Document Reviewed: 07/01/2006 ExitCare Patient Information 2014 ExitCare,  [place].   ________________________________________________________________________  WHAT IS A BLOOD TRANSFUSION? Blood Transfusion Information  A transfusion is the replacement of blood or some of its parts. Blood is made up of multiple cells which provide different functions.  Red blood cells carry oxygen and are used for blood loss replacement.  White blood cells fight against infection.  Platelets control bleeding.  Plasma helps clot blood.  Other blood products are available for specialized needs, such as hemophilia or other clotting disorders. BEFORE THE TRANSFUSION  Who gives blood for transfusions?   Healthy volunteers who are fully evaluated to make sure their blood is safe. This is blood bank blood. Transfusion therapy is the safest it has ever been in the practice of medicine. Before blood is taken from a donor, a complete history is taken to make sure that person has no history of diseases nor engages in risky social behavior (examples are intravenous drug use or sexual activity with multiple partners). The donor's travel history is screened to minimize risk of transmitting infections, such as malaria. The donated blood is tested for signs of infectious diseases, such as HIV and hepatitis. The blood is then tested to be sure it is compatible with you in order to minimize the chance of a transfusion reaction. If you or a relative donates blood, this is often done in anticipation of surgery and is not appropriate for emergency situations. It takes many days to process the donated blood. RISKS AND COMPLICATIONS Although transfusion therapy is very safe and saves many lives, the main dangers of transfusion include:   Getting an infectious disease.  Developing a transfusion reaction. This is an allergic reaction to something in the blood you were given. Every precaution is taken to prevent this. The decision to have a blood transfusion has been considered carefully by your caregiver  before blood is given. Blood is not given unless the benefits outweigh the risks. AFTER THE TRANSFUSION  Right after receiving a blood transfusion, you will usually feel much better and more energetic. This is especially true if your red blood cells have gotten low (anemic). The transfusion raises the level of the red blood cells which carry oxygen, and this usually causes an energy increase.  The nurse administering the transfusion will monitor you carefully for complications. HOME CARE INSTRUCTIONS  No special instructions are needed after a transfusion. You may find your energy is better. Speak with your caregiver about any limitations on activity for underlying diseases you may have. SEEK MEDICAL CARE IF:   Your condition is not improving after your transfusion.  You develop redness  or irritation at the intravenous (IV) site. SEEK IMMEDIATE MEDICAL CARE IF:  Any of the following symptoms occur over the next 12 hours:  Shaking chills.  You have a temperature by mouth above 102 F (38.9 C), not controlled by medicine.  Chest, back, or muscle pain.  People around you feel you are not acting correctly or are confused.  Shortness of breath or difficulty breathing.  Dizziness and fainting.  You get a rash or develop hives.  You have a decrease in urine output.  Your urine turns a dark color or changes to pink, red, or brown. Any of the following symptoms occur over the next 10 days:  You have a temperature by mouth above 102 F (38.9 C), not controlled by medicine.  Shortness of breath.  Weakness after normal activity.  The white part of the eye turns yellow (jaundice).  You have a decrease in the amount of urine or are urinating less often.  Your urine turns a dark color or changes to pink, red, or brown. Document Released: 12/16/1999 Document Revised: 03/12/2011 Document Reviewed: 08/04/2007 [facility] Patient Information 2014 [place],  [place].  _______________________________________________________________________

## 2017-11-27 ENCOUNTER — Encounter (HOSPITAL_COMMUNITY)
Admission: RE | Admit: 2017-11-27 | Discharge: 2017-11-27 | Disposition: A | Discharge status: Home / Self Care | Payer: Medicare (Managed Care) | Source: Ambulatory Visit | Attending: Orthopedic Surgery | Admitting: Orthopedic Surgery

## 2017-11-27 ENCOUNTER — Encounter (HOSPITAL_COMMUNITY): Payer: Self-pay

## 2017-11-27 ENCOUNTER — Other Ambulatory Visit: Payer: Self-pay

## 2017-11-27 DIAGNOSIS — I1310 Hypertensive heart and chronic kidney disease without heart failure, with stage 1 through stage 4 chronic kidney disease, or unspecified chronic kidney disease: Secondary | ICD-10-CM | POA: Diagnosis not present

## 2017-11-27 DIAGNOSIS — M25561 Pain in right knee: Secondary | ICD-10-CM | POA: Insufficient documentation

## 2017-11-27 DIAGNOSIS — Z79891 Long term (current) use of opiate analgesic: Secondary | ICD-10-CM | POA: Insufficient documentation

## 2017-11-27 DIAGNOSIS — Z79899 Other long term (current) drug therapy: Secondary | ICD-10-CM | POA: Diagnosis not present

## 2017-11-27 DIAGNOSIS — N183 Chronic kidney disease, stage 3 (moderate): Secondary | ICD-10-CM | POA: Insufficient documentation

## 2017-11-27 DIAGNOSIS — Z01812 Encounter for preprocedural laboratory examination: Secondary | ICD-10-CM | POA: Insufficient documentation

## 2017-11-27 DIAGNOSIS — M1711 Unilateral primary osteoarthritis, right knee: Secondary | ICD-10-CM | POA: Insufficient documentation

## 2017-11-27 DIAGNOSIS — E1122 Type 2 diabetes mellitus with diabetic chronic kidney disease: Secondary | ICD-10-CM | POA: Insufficient documentation

## 2017-11-27 DIAGNOSIS — M1712 Unilateral primary osteoarthritis, left knee: Secondary | ICD-10-CM | POA: Diagnosis not present

## 2017-11-27 DIAGNOSIS — Z9109 Other allergy status, other than to drugs and biological substances: Secondary | ICD-10-CM | POA: Diagnosis not present

## 2017-11-27 LAB — CBC
HEMATOCRIT: 38.4 % (ref 36.0–46.0)
HEMOGLOBIN: 11.9 g/dL — AB (ref 12.0–15.0)
MCH: 28 pg (ref 26.0–34.0)
MCHC: 31 g/dL (ref 30.0–36.0)
MCV: 90.4 fL (ref 80.0–100.0)
Platelets: 289 10*3/uL (ref 150–400)
RBC: 4.25 MIL/uL (ref 3.87–5.11)
RDW: 13.6 % (ref 11.5–15.5)
WBC: 8.9 10*3/uL (ref 4.0–10.5)
nRBC: 0 % (ref 0.0–0.2)

## 2017-11-27 LAB — ABO/RH: ABO/RH(D): A POS

## 2017-11-27 LAB — BASIC METABOLIC PANEL
Anion gap: 8 (ref 5–15)
BUN: 25 mg/dL — AB (ref 8–23)
CHLORIDE: 105 mmol/L (ref 98–111)
CO2: 27 mmol/L (ref 22–32)
CREATININE: 0.8 mg/dL (ref 0.44–1.00)
Calcium: 9.1 mg/dL (ref 8.9–10.3)
GFR calc Af Amer: >60 mL/min (ref >60)
GFR calc non Af Amer: >60 mL/min (ref >60)
GLUCOSE: 119 mg/dL — AB (ref 70–99)
POTASSIUM: 3.5 mmol/L (ref 3.5–5.1)
Sodium: 140 mmol/L (ref 135–145)

## 2017-11-27 LAB — SURGICAL PCR SCREEN
MRSA, PCR: NEGATIVE
STAPHYLOCOCCUS AUREUS: POSITIVE — AB

## 2017-11-27 LAB — GLUCOSE, CAPILLARY: Glucose-Capillary: 86 mg/dL (ref 70–99)

## 2017-11-27 NOTE — Progress Notes (Addendum)
11-27-17 BMP result routed to Dr. Linna CapriceSwinteck for review  Hx of mild stenosis per 4-18 ECHO. Chart reviewed with Dr.Hodierne Anesthesiologist. Okay for patient to proceed with surgery.

## 2017-12-05 ENCOUNTER — Encounter (HOSPITAL_COMMUNITY): Payer: Self-pay | Admitting: *Deleted

## 2017-12-05 ENCOUNTER — Other Ambulatory Visit: Payer: Self-pay

## 2017-12-05 ENCOUNTER — Encounter (HOSPITAL_COMMUNITY)
Admission: RE | Disposition: A | Discharge status: Skilled Nursing Facility | Payer: Self-pay | Source: Ambulatory Visit | Attending: Orthopedic Surgery

## 2017-12-05 ENCOUNTER — Inpatient Hospital Stay (HOSPITAL_COMMUNITY): Payer: Medicare (Managed Care)

## 2017-12-05 ENCOUNTER — Inpatient Hospital Stay (HOSPITAL_COMMUNITY): Payer: Medicare (Managed Care) | Admitting: Certified Registered Nurse Anesthetist

## 2017-12-05 ENCOUNTER — Inpatient Hospital Stay (HOSPITAL_COMMUNITY)
Admission: RE | Admit: 2017-12-05 | Discharge: 2017-12-12 | DRG: 470 | Disposition: A | Discharge status: Skilled Nursing Facility | Payer: Medicare (Managed Care) | Source: Ambulatory Visit | Attending: Orthopedic Surgery | Admitting: Orthopedic Surgery

## 2017-12-05 DIAGNOSIS — G4733 Obstructive sleep apnea (adult) (pediatric): Secondary | ICD-10-CM | POA: Diagnosis present

## 2017-12-05 DIAGNOSIS — E1140 Type 2 diabetes mellitus with diabetic neuropathy, unspecified: Secondary | ICD-10-CM | POA: Diagnosis present

## 2017-12-05 DIAGNOSIS — Z7951 Long term (current) use of inhaled steroids: Secondary | ICD-10-CM | POA: Diagnosis not present

## 2017-12-05 DIAGNOSIS — Z886 Allergy status to analgesic agent status: Secondary | ICD-10-CM

## 2017-12-05 DIAGNOSIS — Z888 Allergy status to other drugs, medicaments and biological substances status: Secondary | ICD-10-CM | POA: Diagnosis not present

## 2017-12-05 DIAGNOSIS — E7800 Pure hypercholesterolemia, unspecified: Secondary | ICD-10-CM | POA: Diagnosis present

## 2017-12-05 DIAGNOSIS — Z6841 Body Mass Index (BMI) 40.0 and over, adult: Secondary | ICD-10-CM | POA: Diagnosis not present

## 2017-12-05 DIAGNOSIS — E1122 Type 2 diabetes mellitus with diabetic chronic kidney disease: Secondary | ICD-10-CM | POA: Diagnosis present

## 2017-12-05 DIAGNOSIS — Z882 Allergy status to sulfonamides status: Secondary | ICD-10-CM

## 2017-12-05 DIAGNOSIS — Z79899 Other long term (current) drug therapy: Secondary | ICD-10-CM | POA: Diagnosis not present

## 2017-12-05 DIAGNOSIS — H9190 Unspecified hearing loss, unspecified ear: Secondary | ICD-10-CM | POA: Diagnosis present

## 2017-12-05 DIAGNOSIS — I4891 Unspecified atrial fibrillation: Secondary | ICD-10-CM | POA: Diagnosis present

## 2017-12-05 DIAGNOSIS — M1711 Unilateral primary osteoarthritis, right knee: Principal | ICD-10-CM | POA: Diagnosis present

## 2017-12-05 DIAGNOSIS — Z7901 Long term (current) use of anticoagulants: Secondary | ICD-10-CM | POA: Diagnosis not present

## 2017-12-05 DIAGNOSIS — K219 Gastro-esophageal reflux disease without esophagitis: Secondary | ICD-10-CM | POA: Diagnosis present

## 2017-12-05 DIAGNOSIS — F329 Major depressive disorder, single episode, unspecified: Secondary | ICD-10-CM | POA: Diagnosis present

## 2017-12-05 DIAGNOSIS — J449 Chronic obstructive pulmonary disease, unspecified: Secondary | ICD-10-CM | POA: Diagnosis present

## 2017-12-05 DIAGNOSIS — Z91040 Latex allergy status: Secondary | ICD-10-CM | POA: Diagnosis not present

## 2017-12-05 DIAGNOSIS — N183 Chronic kidney disease, stage 3 (moderate): Secondary | ICD-10-CM | POA: Diagnosis present

## 2017-12-05 DIAGNOSIS — Z794 Long term (current) use of insulin: Secondary | ICD-10-CM | POA: Diagnosis not present

## 2017-12-05 DIAGNOSIS — I130 Hypertensive heart and chronic kidney disease with heart failure and stage 1 through stage 4 chronic kidney disease, or unspecified chronic kidney disease: Secondary | ICD-10-CM | POA: Diagnosis present

## 2017-12-05 DIAGNOSIS — Z09 Encounter for follow-up examination after completed treatment for conditions other than malignant neoplasm: Secondary | ICD-10-CM

## 2017-12-05 DIAGNOSIS — I509 Heart failure, unspecified: Secondary | ICD-10-CM | POA: Diagnosis present

## 2017-12-05 HISTORY — PX: KNEE ARTHROPLASTY: SHX992

## 2017-12-05 LAB — HEMOGLOBIN A1C
Hgb A1c MFr Bld: 6.6 % — ABNORMAL HIGH (ref 4.8–5.6)
Mean Plasma Glucose: 142.72 mg/dL

## 2017-12-05 LAB — GLUCOSE, CAPILLARY
Glucose-Capillary: 111 mg/dL — ABNORMAL HIGH (ref 70–99)
Glucose-Capillary: 153 mg/dL — ABNORMAL HIGH (ref 70–99)
Glucose-Capillary: 206 mg/dL — ABNORMAL HIGH (ref 70–99)
Glucose-Capillary: 261 mg/dL — ABNORMAL HIGH (ref 70–99)

## 2017-12-05 LAB — TYPE AND SCREEN
ABO/RH(D): A POS
ANTIBODY SCREEN: NEGATIVE

## 2017-12-05 SURGERY — ARTHROPLASTY, KNEE, TOTAL, USING IMAGELESS COMPUTER-ASSISTED NAVIGATION
Anesthesia: Spinal | Site: Knee | Laterality: Right

## 2017-12-05 MED ORDER — PROPOFOL 10 MG/ML IV BOLUS
INTRAVENOUS | Status: AC
  Filled 2017-12-05: qty 40

## 2017-12-05 MED ORDER — PROMETHAZINE HCL 25 MG/ML IJ SOLN
6.2500 mg | INTRAMUSCULAR | Status: DC | PRN
  Administered 2017-12-05: 6.25 mg via INTRAVENOUS

## 2017-12-05 MED ORDER — ATORVASTATIN CALCIUM 10 MG PO TABS
10.0000 mg | ORAL_TABLET | Freq: Every day | ORAL | Status: DC
  Administered 2017-12-05 – 2017-12-11 (×7): 10 mg via ORAL
  Filled 2017-12-05 (×8): qty 1

## 2017-12-05 MED ORDER — KETOROLAC TROMETHAMINE 30 MG/ML IJ SOLN
INTRAMUSCULAR | Status: DC | PRN
  Administered 2017-12-05: 30 mg

## 2017-12-05 MED ORDER — TRANEXAMIC ACID-NACL 1000-0.7 MG/100ML-% IV SOLN
1000.0000 mg | INTRAVENOUS | Status: AC
  Administered 2017-12-05: 1000 mg via INTRAVENOUS
  Filled 2017-12-05: qty 100

## 2017-12-05 MED ORDER — PROPOFOL 10 MG/ML IV BOLUS
INTRAVENOUS | Status: AC
  Filled 2017-12-05: qty 20

## 2017-12-05 MED ORDER — GABAPENTIN 400 MG PO CAPS
1200.0000 mg | ORAL_CAPSULE | Freq: Every day | ORAL | Status: DC
  Administered 2017-12-05 – 2017-12-11 (×7): 1200 mg via ORAL
  Filled 2017-12-05 (×8): qty 3

## 2017-12-05 MED ORDER — MORPHINE SULFATE (PF) 2 MG/ML IV SOLN
0.5000 mg | INTRAVENOUS | Status: DC | PRN
  Administered 2017-12-05: 1 mg via INTRAVENOUS
  Filled 2017-12-05: qty 1

## 2017-12-05 MED ORDER — MEPERIDINE HCL 50 MG/ML IJ SOLN: 6.2500 mg | INTRAMUSCULAR | Status: DC | PRN

## 2017-12-05 MED ORDER — FAMOTIDINE 20 MG PO TABS
20.0000 mg | ORAL_TABLET | Freq: Two times a day (BID) | ORAL | Status: DC
  Administered 2017-12-05 – 2017-12-12 (×14): 20 mg via ORAL
  Filled 2017-12-05 (×14): qty 1

## 2017-12-05 MED ORDER — CEFAZOLIN SODIUM-DEXTROSE 2-4 GM/100ML-% IV SOLN
2.0000 g | Freq: Four times a day (QID) | INTRAVENOUS | Status: AC
  Administered 2017-12-05 (×2): 2 g via INTRAVENOUS
  Filled 2017-12-05 (×2): qty 100

## 2017-12-05 MED ORDER — LACTATED RINGERS IV SOLN
INTRAVENOUS | Status: DC
  Administered 2017-12-05 (×2): via INTRAVENOUS

## 2017-12-05 MED ORDER — DILTIAZEM HCL ER COATED BEADS 240 MG PO CP24
240.0000 mg | ORAL_CAPSULE | Freq: Every day | ORAL | Status: DC
  Administered 2017-12-06 – 2017-12-12 (×7): 240 mg via ORAL
  Filled 2017-12-05 (×7): qty 1

## 2017-12-05 MED ORDER — ACETAMINOPHEN 10 MG/ML IV SOLN
1000.0000 mg | INTRAVENOUS | Status: AC
  Administered 2017-12-05: 1000 mg via INTRAVENOUS
  Filled 2017-12-05: qty 100

## 2017-12-05 MED ORDER — DEXAMETHASONE SODIUM PHOSPHATE 10 MG/ML IJ SOLN
10.0000 mg | Freq: Once | INTRAMUSCULAR | Status: AC
  Administered 2017-12-06: 10 mg via INTRAVENOUS
  Filled 2017-12-05: qty 1

## 2017-12-05 MED ORDER — PHENOL 1.4 % MT LIQD: 1.0000 | OROMUCOSAL | Status: DC | PRN

## 2017-12-05 MED ORDER — GUAIFENESIN ER 600 MG PO TB12
600.0000 mg | ORAL_TABLET | Freq: Two times a day (BID) | ORAL | Status: DC
  Administered 2017-12-05 – 2017-12-12 (×14): 600 mg via ORAL
  Filled 2017-12-05 (×14): qty 1

## 2017-12-05 MED ORDER — METOCLOPRAMIDE HCL 5 MG PO TABS: 5.0000 mg | ORAL_TABLET | Freq: Three times a day (TID) | ORAL | Status: DC | PRN

## 2017-12-05 MED ORDER — KETOROLAC TROMETHAMINE 30 MG/ML IJ SOLN
INTRAMUSCULAR | Status: AC
  Filled 2017-12-05: qty 1

## 2017-12-05 MED ORDER — GLYCOPYRROLATE PF 0.2 MG/ML IJ SOSY
PREFILLED_SYRINGE | INTRAMUSCULAR | Status: DC | PRN
  Administered 2017-12-05: .2 mg via INTRAVENOUS

## 2017-12-05 MED ORDER — MIDAZOLAM HCL 2 MG/2ML IJ SOLN: 0.5000 mg | Freq: Once | INTRAMUSCULAR | Status: DC | PRN

## 2017-12-05 MED ORDER — INSULIN ASPART 100 UNIT/ML ~~LOC~~ SOLN
0.0000 [IU] | Freq: Three times a day (TID) | SUBCUTANEOUS | Status: DC
  Administered 2017-12-05: 5 [IU] via SUBCUTANEOUS
  Administered 2017-12-06: 8 [IU] via SUBCUTANEOUS
  Administered 2017-12-06: 5 [IU] via SUBCUTANEOUS
  Administered 2017-12-06: 3 [IU] via SUBCUTANEOUS
  Administered 2017-12-07 (×2): 5 [IU] via SUBCUTANEOUS
  Administered 2017-12-07: 8 [IU] via SUBCUTANEOUS
  Administered 2017-12-08 (×3): 3 [IU] via SUBCUTANEOUS
  Administered 2017-12-09: 2 [IU] via SUBCUTANEOUS
  Administered 2017-12-09: 5 [IU] via SUBCUTANEOUS
  Administered 2017-12-09: 3 [IU] via SUBCUTANEOUS
  Administered 2017-12-10: 2 [IU] via SUBCUTANEOUS
  Administered 2017-12-10: 5 [IU] via SUBCUTANEOUS
  Administered 2017-12-10: 8 [IU] via SUBCUTANEOUS
  Administered 2017-12-11 – 2017-12-12 (×4): 3 [IU] via SUBCUTANEOUS

## 2017-12-05 MED ORDER — ONDANSETRON HCL 4 MG PO TABS: 4.0000 mg | ORAL_TABLET | Freq: Four times a day (QID) | ORAL | Status: DC | PRN

## 2017-12-05 MED ORDER — ONDANSETRON HCL 4 MG/2ML IJ SOLN: 4.0000 mg | Freq: Four times a day (QID) | INTRAMUSCULAR | Status: DC | PRN

## 2017-12-05 MED ORDER — DIPHENHYDRAMINE HCL 12.5 MG/5ML PO ELIX: 12.5000 mg | ORAL_SOLUTION | ORAL | Status: DC | PRN

## 2017-12-05 MED ORDER — FESOTERODINE FUMARATE ER 4 MG PO TB24
4.0000 mg | ORAL_TABLET | Freq: Every day | ORAL | Status: DC
  Administered 2017-12-06 – 2017-12-12 (×7): 4 mg via ORAL
  Filled 2017-12-05 (×7): qty 1

## 2017-12-05 MED ORDER — POLYETHYLENE GLYCOL 3350 17 G PO PACK
17.0000 g | PACK | Freq: Every day | ORAL | Status: DC | PRN
  Filled 2017-12-05: qty 1

## 2017-12-05 MED ORDER — ROPINIROLE HCL 1 MG PO TABS
1.0000 mg | ORAL_TABLET | Freq: Every day | ORAL | Status: DC
  Administered 2017-12-05 – 2017-12-11 (×7): 1 mg via ORAL
  Filled 2017-12-05 (×7): qty 1

## 2017-12-05 MED ORDER — PROMETHAZINE HCL 25 MG/ML IJ SOLN
INTRAMUSCULAR | Status: AC
  Filled 2017-12-05: qty 1

## 2017-12-05 MED ORDER — POVIDONE-IODINE 10 % EX SWAB: 2.0000 "application " | Freq: Once | CUTANEOUS | Status: DC

## 2017-12-05 MED ORDER — EPHEDRINE SULFATE-NACL 50-0.9 MG/10ML-% IV SOSY
PREFILLED_SYRINGE | INTRAVENOUS | Status: DC | PRN
  Administered 2017-12-05 (×2): 5 mg via INTRAVENOUS

## 2017-12-05 MED ORDER — POLYETHYLENE GLYCOL 3350 17 G PO PACK
17.0000 g | PACK | Freq: Every day | ORAL | Status: DC | PRN
  Administered 2017-12-09 – 2017-12-12 (×2): 17 g via ORAL
  Filled 2017-12-05: qty 1

## 2017-12-05 MED ORDER — HYDROMORPHONE HCL 1 MG/ML IJ SOLN
0.5000 mg | INTRAMUSCULAR | Status: DC | PRN
  Administered 2017-12-05 – 2017-12-06 (×4): 1 mg via INTRAVENOUS
  Filled 2017-12-05 (×5): qty 1

## 2017-12-05 MED ORDER — PROPOFOL 500 MG/50ML IV EMUL
INTRAVENOUS | Status: DC | PRN
  Administered 2017-12-05: 50 ug/kg/min via INTRAVENOUS

## 2017-12-05 MED ORDER — GABAPENTIN 600 MG PO TABS: 600.0000 mg | ORAL_TABLET | ORAL | Status: DC

## 2017-12-05 MED ORDER — BENZONATATE 100 MG PO CAPS: 100.0000 mg | ORAL_CAPSULE | Freq: Three times a day (TID) | ORAL | Status: DC | PRN

## 2017-12-05 MED ORDER — APIXABAN 2.5 MG PO TABS
2.5000 mg | ORAL_TABLET | Freq: Two times a day (BID) | ORAL | Status: DC
  Administered 2017-12-06 – 2017-12-12 (×13): 2.5 mg via ORAL
  Filled 2017-12-05 (×13): qty 1

## 2017-12-05 MED ORDER — CITALOPRAM HYDROBROMIDE 20 MG PO TABS
20.0000 mg | ORAL_TABLET | Freq: Every day | ORAL | Status: DC
  Administered 2017-12-06 – 2017-12-12 (×7): 20 mg via ORAL
  Filled 2017-12-05 (×7): qty 1

## 2017-12-05 MED ORDER — WATER BOTTLE MISC
Status: DC | PRN
  Administered 2017-12-05: 2000 mL

## 2017-12-05 MED ORDER — SIMETHICONE 80 MG PO CHEW
80.0000 mg | CHEWABLE_TABLET | Freq: Four times a day (QID) | ORAL | Status: DC | PRN
  Administered 2017-12-12: 80 mg via ORAL
  Filled 2017-12-05: qty 1

## 2017-12-05 MED ORDER — BUPIVACAINE IN DEXTROSE 0.75-8.25 % IT SOLN
INTRATHECAL | Status: DC | PRN
  Administered 2017-12-05: 1.6 mL via INTRATHECAL

## 2017-12-05 MED ORDER — ISOPROPYL ALCOHOL 70 % SOLN
Status: DC | PRN
  Administered 2017-12-05: 1 via TOPICAL

## 2017-12-05 MED ORDER — HYDROMORPHONE HCL 1 MG/ML IJ SOLN: 0.2500 mg | INTRAMUSCULAR | Status: DC | PRN

## 2017-12-05 MED ORDER — SENNA 8.6 MG PO TABS
1.0000 | ORAL_TABLET | Freq: Two times a day (BID) | ORAL | Status: DC
  Administered 2017-12-05 – 2017-12-12 (×13): 8.6 mg via ORAL
  Filled 2017-12-05 (×14): qty 1

## 2017-12-05 MED ORDER — HYDROCODONE-ACETAMINOPHEN 5-325 MG PO TABS
1.0000 | ORAL_TABLET | ORAL | Status: DC | PRN
  Administered 2017-12-05 – 2017-12-06 (×4): 2 via ORAL
  Filled 2017-12-05 (×4): qty 2

## 2017-12-05 MED ORDER — FENTANYL CITRATE (PF) 100 MCG/2ML IJ SOLN
50.0000 ug | INTRAMUSCULAR | Status: DC
  Administered 2017-12-05: 100 ug via INTRAVENOUS
  Filled 2017-12-05: qty 2

## 2017-12-05 MED ORDER — LIRAGLUTIDE 18 MG/3ML ~~LOC~~ SOPN: 1.2000 mg | PEN_INJECTOR | Freq: Every day | SUBCUTANEOUS | Status: DC

## 2017-12-05 MED ORDER — ONDANSETRON HCL 4 MG/2ML IJ SOLN
INTRAMUSCULAR | Status: DC | PRN
  Administered 2017-12-05: 4 mg via INTRAVENOUS

## 2017-12-05 MED ORDER — FUROSEMIDE 40 MG PO TABS
40.0000 mg | ORAL_TABLET | Freq: Every day | ORAL | Status: DC
  Administered 2017-12-05 – 2017-12-12 (×8): 40 mg via ORAL
  Filled 2017-12-05 (×8): qty 1

## 2017-12-05 MED ORDER — HYDROCODONE-ACETAMINOPHEN 7.5-325 MG PO TABS: 1.0000 | ORAL_TABLET | ORAL | Status: DC | PRN

## 2017-12-05 MED ORDER — GABAPENTIN 300 MG PO CAPS
600.0000 mg | ORAL_CAPSULE | Freq: Every morning | ORAL | Status: DC
  Administered 2017-12-06 – 2017-12-12 (×7): 600 mg via ORAL
  Filled 2017-12-05 (×7): qty 2

## 2017-12-05 MED ORDER — SODIUM CHLORIDE 0.9 % IR SOLN
Status: DC | PRN
  Administered 2017-12-05 (×2): 1000 mL
  Administered 2017-12-05: 3000 mL

## 2017-12-05 MED ORDER — MENTHOL 3 MG MT LOZG: 1.0000 | LOZENGE | OROMUCOSAL | Status: DC | PRN

## 2017-12-05 MED ORDER — MONTELUKAST SODIUM 10 MG PO TABS
10.0000 mg | ORAL_TABLET | Freq: Every day | ORAL | Status: DC
  Administered 2017-12-05 – 2017-12-11 (×7): 10 mg via ORAL
  Filled 2017-12-05 (×7): qty 1

## 2017-12-05 MED ORDER — SODIUM CHLORIDE 0.9 % IV SOLN
INTRAVENOUS | Status: DC
  Administered 2017-12-05 – 2017-12-06 (×3): via INTRAVENOUS

## 2017-12-05 MED ORDER — ACETAMINOPHEN 325 MG PO TABS
325.0000 mg | ORAL_TABLET | Freq: Four times a day (QID) | ORAL | Status: DC | PRN
  Administered 2017-12-07 – 2017-12-12 (×7): 650 mg via ORAL
  Filled 2017-12-05 (×6): qty 2

## 2017-12-05 MED ORDER — PHENYLEPHRINE HCL 10 MG/ML IJ SOLN
INTRAMUSCULAR | Status: AC
  Filled 2017-12-05: qty 1

## 2017-12-05 MED ORDER — PROPOFOL 10 MG/ML IV BOLUS
INTRAVENOUS | Status: DC | PRN
  Administered 2017-12-05 (×6): 10 mg via INTRAVENOUS

## 2017-12-05 MED ORDER — EPHEDRINE 5 MG/ML INJ
INTRAVENOUS | Status: AC
  Filled 2017-12-05: qty 10

## 2017-12-05 MED ORDER — CHLORHEXIDINE GLUCONATE 4 % EX LIQD: 60.0000 mL | Freq: Once | CUTANEOUS | Status: DC

## 2017-12-05 MED ORDER — DOCUSATE SODIUM 100 MG PO CAPS
100.0000 mg | ORAL_CAPSULE | Freq: Two times a day (BID) | ORAL | Status: DC
  Administered 2017-12-05 – 2017-12-12 (×14): 100 mg via ORAL
  Filled 2017-12-05 (×14): qty 1

## 2017-12-05 MED ORDER — BUPIVACAINE-EPINEPHRINE (PF) 0.25% -1:200000 IJ SOLN
INTRAMUSCULAR | Status: AC
  Filled 2017-12-05: qty 30

## 2017-12-05 MED ORDER — ALBUTEROL SULFATE (2.5 MG/3ML) 0.083% IN NEBU: 3.0000 mL | INHALATION_SOLUTION | Freq: Three times a day (TID) | RESPIRATORY_TRACT | Status: DC | PRN

## 2017-12-05 MED ORDER — LOSARTAN POTASSIUM 50 MG PO TABS
100.0000 mg | ORAL_TABLET | Freq: Every day | ORAL | Status: DC
  Administered 2017-12-06 – 2017-12-12 (×6): 100 mg via ORAL
  Filled 2017-12-05 (×7): qty 2

## 2017-12-05 MED ORDER — SODIUM CHLORIDE 0.9 % IV SOLN
INTRAVENOUS | Status: DC | PRN
  Administered 2017-12-05: 40 ug/min via INTRAVENOUS

## 2017-12-05 MED ORDER — SODIUM CHLORIDE (PF) 0.9 % IJ SOLN
INTRAMUSCULAR | Status: AC
  Filled 2017-12-05: qty 50

## 2017-12-05 MED ORDER — BUPIVACAINE-EPINEPHRINE 0.25% -1:200000 IJ SOLN
INTRAMUSCULAR | Status: DC | PRN
  Administered 2017-12-05: 30 mL

## 2017-12-05 MED ORDER — CEFAZOLIN SODIUM-DEXTROSE 2-4 GM/100ML-% IV SOLN
2.0000 g | INTRAVENOUS | Status: AC
  Administered 2017-12-05: 2 g via INTRAVENOUS
  Filled 2017-12-05: qty 100

## 2017-12-05 MED ORDER — ROPIVACAINE HCL 7.5 MG/ML IJ SOLN
INTRAMUSCULAR | Status: DC | PRN
  Administered 2017-12-05: 20 mL via PERINEURAL

## 2017-12-05 MED ORDER — SODIUM CHLORIDE 0.9 % IV SOLN: INTRAVENOUS | Status: DC

## 2017-12-05 MED ORDER — METOCLOPRAMIDE HCL 5 MG/ML IJ SOLN: 5.0000 mg | Freq: Three times a day (TID) | INTRAMUSCULAR | Status: DC | PRN

## 2017-12-05 MED ORDER — ALUM & MAG HYDROXIDE-SIMETH 200-200-20 MG/5ML PO SUSP: 30.0000 mL | ORAL | Status: DC | PRN

## 2017-12-05 MED ORDER — METHOCARBAMOL 500 MG IVPB - SIMPLE MED
500.0000 mg | Freq: Four times a day (QID) | INTRAVENOUS | Status: DC | PRN
  Filled 2017-12-05: qty 50

## 2017-12-05 MED ORDER — DEXAMETHASONE SODIUM PHOSPHATE 10 MG/ML IJ SOLN
INTRAMUSCULAR | Status: DC | PRN
  Administered 2017-12-05: 10 mg via INTRAVENOUS

## 2017-12-05 MED ORDER — MOMETASONE FURO-FORMOTEROL FUM 200-5 MCG/ACT IN AERO
2.0000 | INHALATION_SPRAY | Freq: Two times a day (BID) | RESPIRATORY_TRACT | Status: DC
  Administered 2017-12-05 – 2017-12-12 (×12): 2 via RESPIRATORY_TRACT
  Filled 2017-12-05 (×2): qty 8.8

## 2017-12-05 MED ORDER — METHOCARBAMOL 500 MG PO TABS
500.0000 mg | ORAL_TABLET | Freq: Four times a day (QID) | ORAL | Status: DC | PRN
  Administered 2017-12-06 – 2017-12-12 (×15): 500 mg via ORAL
  Filled 2017-12-05 (×15): qty 1

## 2017-12-05 MED ORDER — SODIUM CHLORIDE 0.9% FLUSH
INTRAVENOUS | Status: DC | PRN
  Administered 2017-12-05: 30 mL

## 2017-12-05 MED ORDER — LORATADINE 10 MG PO TABS
10.0000 mg | ORAL_TABLET | Freq: Every morning | ORAL | Status: DC
  Administered 2017-12-06 – 2017-12-12 (×7): 10 mg via ORAL
  Filled 2017-12-05 (×7): qty 1

## 2017-12-05 SURGICAL SUPPLY — 62 items
BAG ZIPLOCK 12X15 (MISCELLANEOUS) IMPLANT
BANDAGE ACE 4X5 VEL STRL LF (GAUZE/BANDAGES/DRESSINGS) ×3 IMPLANT
BANDAGE ACE 6X5 VEL STRL LF (GAUZE/BANDAGES/DRESSINGS) ×3 IMPLANT
BATTERY INSTRU NAVIGATION (MISCELLANEOUS) ×9 IMPLANT
BLADE SAW RECIPROCATING 77.5 (BLADE) ×3 IMPLANT
BNDG ELASTIC 6X10 VLCR STRL LF (GAUZE/BANDAGES/DRESSINGS) ×3 IMPLANT
CHLORAPREP W/TINT 26ML (MISCELLANEOUS) ×6 IMPLANT
COVER SURGICAL LIGHT HANDLE (MISCELLANEOUS) ×3 IMPLANT
COVER WAND RF STERILE (DRAPES) ×3 IMPLANT
CUFF TOURN SGL QUICK 34 (TOURNIQUET CUFF) ×2
CUFF TRNQT CYL 34X4X40X1 (TOURNIQUET CUFF) ×1 IMPLANT
DECANTER SPIKE VIAL GLASS SM (MISCELLANEOUS) ×6 IMPLANT
DERMABOND ADVANCED (GAUZE/BANDAGES/DRESSINGS) ×2
DERMABOND ADVANCED .7 DNX12 (GAUZE/BANDAGES/DRESSINGS) ×1 IMPLANT
DRAPE SHEET LG 3/4 BI-LAMINATE (DRAPES) ×6 IMPLANT
DRAPE U-SHAPE 47X51 STRL (DRAPES) ×3 IMPLANT
DRSG AQUACEL AG ADV 3.5X10 (GAUZE/BANDAGES/DRESSINGS) ×3 IMPLANT
DRSG TEGADERM 4X4.75 (GAUZE/BANDAGES/DRESSINGS) IMPLANT
ELECT BLADE TIP CTD 4 INCH (ELECTRODE) ×3 IMPLANT
ELECT REM PT RETURN 15FT ADLT (MISCELLANEOUS) ×3 IMPLANT
EVACUATOR 1/8 PVC DRAIN (DRAIN) IMPLANT
GAUZE SPONGE 4X4 12PLY STRL (GAUZE/BANDAGES/DRESSINGS) ×3 IMPLANT
GLOVE BIO SURGEON STRL SZ8.5 (GLOVE) ×6 IMPLANT
GLOVE BIOGEL PI IND STRL 8.5 (GLOVE) ×1 IMPLANT
GLOVE BIOGEL PI INDICATOR 8.5 (GLOVE) ×2
GOWN SPEC L3 XXLG W/TWL (GOWN DISPOSABLE) ×3 IMPLANT
HANDPIECE INTERPULSE COAX TIP (DISPOSABLE) ×2
HOOD PEEL AWAY FLYTE STAYCOOL (MISCELLANEOUS) ×9 IMPLANT
INSERT TIBIAL BEARING KNEE 9MM (Knees) ×3 IMPLANT
KNEE FEMORAL COMP RT RETAIN (Knees) ×3 IMPLANT
KNEE PATELLA ASYMMETRIC 10X32 (Knees) ×3 IMPLANT
KNEE TIBIAL COMP TRI SZ4 (Knees) ×3 IMPLANT
MARKER SKIN DUAL TIP RULER LAB (MISCELLANEOUS) ×3 IMPLANT
NEEDLE SPNL 18GX3.5 QUINCKE PK (NEEDLE) ×3 IMPLANT
NS IRRIG 1000ML POUR BTL (IV SOLUTION) ×3 IMPLANT
PACK TOTAL KNEE CUSTOM (KITS) ×3 IMPLANT
PADDING CAST COTTON 6X4 STRL (CAST SUPPLIES) ×3 IMPLANT
PADDING CAST SYN 6 (CAST SUPPLIES) ×2
PADDING CAST SYNTHETIC 6X4 NS (CAST SUPPLIES) ×1 IMPLANT
PROTECTOR NERVE ULNAR (MISCELLANEOUS) ×3 IMPLANT
SAW OSC TIP CART 19.5X105X1.3 (SAW) ×3 IMPLANT
SEALER BIPOLAR AQUA 6.0 (INSTRUMENTS) ×3 IMPLANT
SET HNDPC FAN SPRY TIP SCT (DISPOSABLE) ×1 IMPLANT
SET PAD KNEE POSITIONER (MISCELLANEOUS) ×3 IMPLANT
SPONGE DRAIN TRACH 4X4 STRL 2S (GAUZE/BANDAGES/DRESSINGS) IMPLANT
SPONGE LAP 18X18 RF (DISPOSABLE) IMPLANT
SUT MNCRL AB 3-0 PS2 18 (SUTURE) ×3 IMPLANT
SUT MNCRL AB 4-0 PS2 18 (SUTURE) ×3 IMPLANT
SUT MON AB 2-0 CT1 36 (SUTURE) ×6 IMPLANT
SUT STRATAFIX PDO 1 14 VIOLET (SUTURE) ×2
SUT STRATFX PDO 1 14 VIOLET (SUTURE) ×1
SUT VIC AB 1 CTX 36 (SUTURE) ×4
SUT VIC AB 1 CTX36XBRD ANBCTR (SUTURE) ×2 IMPLANT
SUT VIC AB 2-0 CT1 27 (SUTURE) ×2
SUT VIC AB 2-0 CT1 TAPERPNT 27 (SUTURE) ×1 IMPLANT
SUTURE STRATFX PDO 1 14 VIOLET (SUTURE) ×1 IMPLANT
SYR 50ML LL SCALE MARK (SYRINGE) ×3 IMPLANT
TOWER CARTRIDGE SMART MIX (DISPOSABLE) IMPLANT
TRAY FOLEY MTR SLVR 16FR STAT (SET/KITS/TRAYS/PACK) IMPLANT
WATER STERILE IRR 1000ML POUR (IV SOLUTION) ×6 IMPLANT
WRAP KNEE MAXI GEL POST OP (GAUZE/BANDAGES/DRESSINGS) ×3 IMPLANT
YANKAUER SUCT BULB TIP 10FT TU (MISCELLANEOUS) ×3 IMPLANT

## 2017-12-05 NOTE — Interval H&P Note (Signed)
History and Physical Interval Note:  12/05/2017 9:55 AM  Elaine Lee  has presented today for surgery, with the diagnosis of Degenerative joint disease right knee- valgus  The various methods of treatment have been discussed with the patient and family. After consideration of risks, benefits and other options for treatment, the patient has consented to  Procedure(s): COMPUTER ASSISTED TOTAL KNEE ARTHROPLASTY (Right) as a surgical intervention .  The patient's history has been reviewed, patient examined, no change in status, stable for surgery.  I have reviewed the patient's chart and labs.  Questions were answered to the patient's satisfaction.     Iline OvenBrian J Caelin Rayl

## 2017-12-05 NOTE — Progress Notes (Signed)
AssistedDr. Carswell Jackson with right, ultrasound guided, adductor canal block. Side rails up, monitors on throughout procedure. See vital signs in flow sheet. Tolerated Procedure well.  

## 2017-12-05 NOTE — Anesthesia Procedure Notes (Signed)
Anesthesia Regional Block: Adductor canal block   Pre-Anesthetic Checklist: ,, timeout performed, Correct Patient, Correct Site, Correct Laterality, Correct Procedure, Correct Position, site marked, Risks and benefits discussed,  Surgical consent,  Pre-op evaluation,  At surgeon's request and post-op pain management  Laterality: Right and Lower  Prep: chloraprep       Needles:  Injection technique: Single-shot  Needle Type: Echogenic Needle     Needle Length: 9cm  Needle Gauge: 21     Additional Needles:   Procedures:,,,, ultrasound used (permanent image in chart),,,,  Narrative:  Start time: 12/05/2017 10:05 AM End time: 12/05/2017 10:11 AM Injection made incrementally with aspirations every 5 mL.  Performed by: Personally  Anesthesiologist: Jairo BenJackson, Kamuela Magos, MD  Additional Notes: Pt identified in Holding room.  Monitors applied. Working IV access confirmed. Sterile prep, drape R thigh.  #21ga ECHOgenic needle into adductor canal with US guidance.  20cc 0.75% Ropivacaine injected incrementally after negative test dose.  Patient asymptomatic, VSS, no heme aspirated, tolerated well.  Sandford Craze Hally Colella, MD

## 2017-12-05 NOTE — Anesthesia Preprocedure Evaluation (Addendum)
Anesthesia Evaluation  Patient identified by MRN, date of birth, ID band Patient awake    Reviewed: Allergy & Precautions, NPO status , Patient's Chart, lab work & pertinent test results  History of Anesthesia Complications Negative for: history of anesthetic complications  Airway Mallampati: II  TM Distance: >3 FB Neck ROM: Full    Dental  (+) Edentulous Upper, Partial Lower, Dental Advisory Given, Poor Dentition   Pulmonary sleep apnea and Continuous Positive Airway Pressure Ventilation , COPD,  COPD inhaler,    breath sounds clear to auscultation       Cardiovascular hypertension, Pt. on medications (-) angina+ dysrhythmias Atrial Fibrillation  Rhythm:Irregular Rate:Normal  '18 ECHO: EF 60-65%. Wall motion was normal, mild aortic stenosis. Valve area (VTI): 1.58 cm^2, mild TR   Neuro/Psych negative neurological ROS     GI/Hepatic Neg liver ROS, GERD  Medicated and Controlled,  Endo/Other  diabetes (glu 111), Insulin DependentMorbid obesity  Renal/GU Renal InsufficiencyRenal disease     Musculoskeletal  (+) Arthritis ,   Abdominal (+) + obese,   Peds  Hematology eliquis   Anesthesia Other Findings   Reproductive/Obstetrics                            Anesthesia Physical Anesthesia Plan  ASA: III  Anesthesia Plan: Spinal   Post-op Pain Management:  Regional for Post-op pain   Induction:   PONV Risk Score and Plan: 2 and Ondansetron and Dexamethasone  Airway Management Planned: Natural Airway and Mask  Additional Equipment:   Intra-op Plan:   Post-operative Plan:   Informed Consent: I have reviewed the patients History and Physical, chart, labs and discussed the procedure including the risks, benefits and alternatives for the proposed anesthesia with the patient or authorized representative who has indicated his/her understanding and acceptance.   Dental advisory given  Plan  Discussed with: CRNA and Surgeon  Anesthesia Plan Comments: (Plan routine monitors, SAB with adductor canal block for post op analgesia)        Anesthesia Quick Evaluation

## 2017-12-05 NOTE — Transfer of Care (Signed)
Immediate Anesthesia Transfer of Care Note  Patient: Elaine Lee  Procedure(s) Performed: COMPUTER ASSISTED TOTAL KNEE ARTHROPLASTY (Right Knee)  Patient Location: PACU  Anesthesia Type:Spinal  Level of Consciousness: drowsy and patient cooperative  Airway & Oxygen Therapy: Patient Spontanous Breathing and Patient connected to face mask oxygen  Post-op Assessment: Report given to RN and Post -op Vital signs reviewed and stable  Post vital signs: Reviewed and stable  Last Vitals:  Vitals Value Taken Time  BP 96/62 12/05/2017  1:36 PM  Temp    Pulse 79 12/05/2017  1:39 PM  Resp 14 12/05/2017  1:39 PM  SpO2 100 % 12/05/2017  1:39 PM  Vitals shown include unvalidated device data.  Last Pain:  Vitals:   12/05/17 0901  TempSrc:   PainSc: 6       Patients Stated Pain Goal: 4 (12/05/17 0901)  Complications: No apparent anesthesia complications

## 2017-12-05 NOTE — Anesthesia Procedure Notes (Signed)
Spinal  Patient location during procedure: OR End time: 12/05/2017 11:15 AM Staffing Anesthesiologist: Jairo BenJackson, Valiant Dills, MD Performed: anesthesiologist  Preanesthetic Checklist Completed: patient identified, site marked, surgical consent, pre-op evaluation, timeout performed, IV checked, risks and benefits discussed and monitors and equipment checked Spinal Block Patient position: sitting Prep: site prepped and draped and DuraPrep Patient monitoring: blood pressure, continuous pulse ox, cardiac monitor and heart rate Approach: midline Location: L3-4 Injection technique: single-shot Needle Needle type: Pencan  Needle gauge: 24 G Needle length: 9 cm Additional Notes Pt identified in Operating room.  Monitors applied. Working IV access confirmed. Sterile prep, drape lumbar spine.  1% lido local L 3,4.  #25ga Quincke into clear CSF L 3,4.  12mg  0.75% Bupivacaine with dextrose injected with asp CSF beginning and end of injection.  Patient asymptomatic, VSS, no heme aspirated, tolerated well.  Sandford Craze Yarnell Kozloski, MD

## 2017-12-05 NOTE — Op Note (Signed)
OPERATIVE REPORT  SURGEON: Rod Can, MD   ASSISTANT: Nehemiah Massed, PA-C.  PREOPERATIVE DIAGNOSIS: Right knee arthritis.   POSTOPERATIVE DIAGNOSIS: Right knee arthritis.   PROCEDURE: Right total knee arthroplasty.   IMPLANTS: Stryker Triathlon CR femur, size 4. Stryker Tritanium tibia, size 4. X3 polyethelyene insert, size 9 mm, CS. 3 button asymmetric patella, size 32 mm.  ANESTHESIA:  MAC, Regional and Spinal  TOURNIQUET TIME: Not utilized.   ESTIMATED BLOOD LOSS:-300 mL    ANTIBIOTICS: 2 g Ancef.  DRAINS: None.  COMPLICATIONS: None   CONDITION: PACU - hemodynamically stable.   BRIEF CLINICAL NOTE: Elaine Lee is a 72 y.o. female with a long-standing history of Right knee arthritis. After failing conservative management, the patient was indicated for total knee arthroplasty. The risks, benefits, and alternatives to the procedure were explained, and the patient elected to proceed.  PROCEDURE IN DETAIL: Adductor canal block was obtained in the pre-op holding area. Once inside the operative room, spinal anesthesia was obtained, and a foley catheter was inserted. The patient was then positioned, a nonsterile tourniquet was placed, and the lower extremity was prepped and draped in the normal sterile surgical fashion. A time-out was called verifying side and site of surgery. The patient received IV antibiotics within 60 minutes of beginning the procedure. The tourniquet was not utilized.  An anterior approach to the knee was performed utilizing a midvastus arthrotomy. A medial release was performed and the patellar fat pad was excised. Stryker navigation was used to cut the distal femur perpendicular to the mechanical axis. A freehand patellar resection was performed, and the patella was sized an prepared with 3 lug holes.  Nagivation was used to make a neutral  proximal tibia resection, taking 3 mm of bone from the less affected medial side with 3 degrees of slope. The menisci were excised. A spacer block was placed, and the alignment and balance in extension were confirmed.   The distal femur was sized using the 3-degree external rotation guide referencing the posterior femoral cortex. The appropriate 4-in-1 cutting block was pinned into place. Rotation was checked using Whiteside's line, the epicondylar axis, and then confirmed with a spacer block in flexion. The remaining femoral cuts were performed, taking care to protect the MCL.  The tibia was sized and the trial tray was pinned into place. The remaining trail components were inserted. The knee was stable to varus and valgus stress through a full range of motion. The patella tracked centrally, and the PCL was well balanced. The trial components were removed, and the proximal tibial surface was prepared. Final components were impacted into place. The knee was tested for a final time and found to be well balanced.  The wound was copiously irrigated with normal saline using pulse lavage. Marcaine solution was injected into the periarticular soft tissue. The wound was closed in layers using #1 Vicryl and Stratafix for the fascia, 2-0 Vicryl for the subcutaneous fat, 2-0 Monocryl for the deep dermal layer, 3-0 running Monocryl subcuticular Stitch, and Dermabond for the skin. Once the glue was fully dried, an Aquacell Ag and compressive dressing were applied. Tthe patient was transported to the recovery room in stable condition. Sponge, needle, and instrument counts were correct at the end of the case x2. The patient tolerated the procedure well and there were no known complications.  Please note that a surgical assistant was a medical necessity for this procedure in order to perform it in a safe and expeditious manner. Surgical assistant was  necessary to retract the ligaments and vital neurovascular  structures to prevent injury to them and also necessary for proper positioning of the limb to allow for anatomic placement of the prosthesis.

## 2017-12-05 NOTE — Discharge Instructions (Signed)
° °Dr. Kalie Cabral °Total Joint Specialist °[facility] °[address] °[place], [place] [address] °[phone] ° °TOTAL KNEE REPLACEMENT POSTOPERATIVE DIRECTIONS ° ° ° °Knee Rehabilitation, Guidelines Following Surgery  °Results after knee surgery are often greatly improved when you follow the exercise, range of motion and muscle strengthening exercises prescribed by your doctor. Safety measures are also important to protect the knee from further injury. Any time any of these exercises cause you to have increased pain or swelling in your knee joint, decrease the amount until you are comfortable again and slowly increase them. If you have problems or questions, call your caregiver or physical therapist for advice.  ° °WEIGHT BEARING °Weight bearing as tolerated with assist device (walker, cane, etc) as directed, use it as long as suggested by your surgeon or therapist, typically at least 4-6 weeks. ° °HOME CARE INSTRUCTIONS  °Remove items at home which could result in a fall. This includes throw rugs or furniture in walking pathways.  °Continue medications as instructed at time of discharge. °You may have some home medications which will be placed on hold until you complete the course of blood thinner medication.  °You may start showering once you are discharged home but do not submerge the incision under water. Just pat the incision dry and apply a dry gauze dressing on daily. °Walk with walker as instructed.  °You may resume a sexual relationship in one month or when given the OK by your doctor.  °· Use walker as long as suggested by your caregivers. °· Avoid periods of inactivity such as sitting longer than an hour when not asleep. This helps prevent blood clots.  °You may put full weight on your legs and walk as much as is comfortable.  °You may return to work once you are cleared by your doctor.  °Do not drive a car for 6 weeks or until released by you surgeon.  °· Do not drive  while taking narcotics.  °Wear the elastic stockings for three weeks following surgery during the day but you may remove then at night. °Make sure you keep all of your appointments after your operation with all of your doctors and caregivers. You should call the office at the above phone number and make an appointment for approximately two weeks after the date of your surgery. °Do not remove your surgical dressing. The dressing is waterproof; you may take showers in 3 days, but do not take tub baths or submerge the dressing. °Please pick up a stool softener and laxative for home use as long as you are requiring pain medications. °· ICE to the affected knee every three hours for 30 minutes at a time and then as needed for pain and swelling.  Continue to use ice on the knee for pain and swelling from surgery. You may notice swelling that will progress down to the foot and ankle.  This is normal after surgery.  Elevate the leg when you are not up walking on it.   °It is important for you to complete the blood thinner medication as prescribed by your doctor. °· Continue to use the breathing machine which will help keep your temperature down.  It is common for your temperature to cycle up and down following surgery, especially at night when you are not up moving around and exerting yourself.  The breathing machine keeps your lungs expanded and your temperature down. ° °RANGE OF MOTION AND STRENGTHENING EXERCISES  °Rehabilitation of the knee is important following   a knee injury or an operation. After just a few days of immobilization, the muscles of the thigh which control the knee become weakened and shrink (atrophy). Knee exercises are designed to build up the tone and strength of the thigh muscles and to improve knee motion. Often times heat used for twenty to thirty minutes before working out will loosen up your tissues and help with improving the range of motion but do not use heat for the first two weeks following  surgery. These exercises can be done on a training (exercise) mat, on the floor, on a table or on a bed. Use what ever works the best and is most comfortable for you Knee exercises include:  °Leg Lifts - While your knee is still immobilized in a splint or cast, you can do straight leg raises. Lift the leg to 60 degrees, hold for 3 sec, and slowly lower the leg. Repeat 10-20 times 2-3 times daily. Perform this exercise against resistance later as your knee gets better.  °Quad and Hamstring Sets - Tighten up the muscle on the front of the thigh (Quad) and hold for 5-10 sec. Repeat this 10-20 times hourly. Hamstring sets are done by pushing the foot backward against an object and holding for 5-10 sec. Repeat as with quad sets.  °A rehabilitation program following serious knee injuries can speed recovery and prevent re-injury in the future due to weakened muscles. Contact your doctor or a physical therapist for more information on knee rehabilitation.  ° °SKILLED REHAB INSTRUCTIONS: °If the patient is transferred to a skilled rehab facility following release from the hospital, a list of the current medications will be sent to the facility for the patient to continue.  When discharged from the skilled rehab facility, please have the facility set up the patient's Home Health Physical Therapy prior to being released. Also, the skilled facility will be responsible for providing the patient with their medications at time of release from the facility to include their pain medication, the muscle relaxants, and their blood thinner medication. If the patient is still at the rehab facility at time of the two week follow up appointment, the skilled rehab facility will also need to assist the patient in arranging follow up appointment in our office and any transportation needs. ° °MAKE SURE YOU:  °Understand these instructions.  °Will watch your condition.  °Will get help right away if you are not doing well or get worse.   ° ° °Pick up stool softner and laxative for home use following surgery while on pain medications. °Do NOT remove your dressing. You may shower.  °Do not take tub baths or submerge incision under water. °May shower starting three days after surgery. °Please use a clean towel to pat the incision dry following showers. °Continue to use ice for pain and swelling after surgery. °Do not use any lotions or creams on the incision until instructed by your surgeon. ° °

## 2017-12-05 NOTE — Anesthesia Postprocedure Evaluation (Signed)
Anesthesia Post Note  Patient: Elaine Lee  Procedure(s) Performed: COMPUTER ASSISTED TOTAL KNEE ARTHROPLASTY (Right Knee)     Patient location during evaluation: PACU Anesthesia Type: Spinal Level of consciousness: awake and alert, oriented and patient cooperative Pain management: pain level controlled Vital Signs Assessment: post-procedure vital signs reviewed and stable Respiratory status: spontaneous breathing, nonlabored ventilation, respiratory function stable and patient connected to nasal cannula oxygen Cardiovascular status: blood pressure returned to baseline and stable Postop Assessment: adequate PO intake and spinal receding (nausea improved) Anesthetic complications: no    Last Vitals:  Vitals:   12/05/17 1430 12/05/17 1446  BP: (!) 118/47 (!) 110/59  Pulse: 62 62  Resp: 15 16  Temp: 36.4 C 36.5 C  SpO2: 95% 97%    Last Pain:  Vitals:   12/05/17 1446  TempSrc: Oral  PainSc:                  Flor Whitacre,E. Tanganika Barradas

## 2017-12-06 ENCOUNTER — Encounter (HOSPITAL_COMMUNITY): Payer: Self-pay | Admitting: Orthopedic Surgery

## 2017-12-06 LAB — CBC
HCT: 30.4 % — ABNORMAL LOW (ref 36.0–46.0)
Hemoglobin: 9.3 g/dL — ABNORMAL LOW (ref 12.0–15.0)
MCH: 28.3 pg (ref 26.0–34.0)
MCHC: 30.6 g/dL (ref 30.0–36.0)
MCV: 92.4 fL (ref 80.0–100.0)
NRBC: 0 % (ref 0.0–0.2)
Platelets: 201 10*3/uL (ref 150–400)
RBC: 3.29 MIL/uL — ABNORMAL LOW (ref 3.87–5.11)
RDW: 13.4 % (ref 11.5–15.5)
WBC: 13.3 10*3/uL — ABNORMAL HIGH (ref 4.0–10.5)

## 2017-12-06 LAB — GLUCOSE, CAPILLARY
Glucose-Capillary: 199 mg/dL — ABNORMAL HIGH (ref 70–99)
Glucose-Capillary: 211 mg/dL — ABNORMAL HIGH (ref 70–99)
Glucose-Capillary: 293 mg/dL — ABNORMAL HIGH (ref 70–99)
Glucose-Capillary: 215 mg/dL — ABNORMAL HIGH (ref 70–99)

## 2017-12-06 LAB — BASIC METABOLIC PANEL
Anion gap: 8 (ref 5–15)
BUN: 17 mg/dL (ref 8–23)
CO2: 25 mmol/L (ref 22–32)
Calcium: 8.8 mg/dL — ABNORMAL LOW (ref 8.9–10.3)
Chloride: 106 mmol/L (ref 98–111)
Creatinine, Ser: 0.73 mg/dL (ref 0.44–1.00)
GFR calc Af Amer: >60 mL/min (ref >60)
GFR calc non Af Amer: >60 mL/min (ref >60)
GLUCOSE: 210 mg/dL — AB (ref 70–99)
Potassium: 4.3 mmol/L (ref 3.5–5.1)
Sodium: 139 mmol/L (ref 135–145)

## 2017-12-06 MED ORDER — SENNA 8.6 MG PO TABS: 2.0000 | ORAL_TABLET | Freq: Every day | ORAL | 0 refills | Status: DC

## 2017-12-06 MED ORDER — LIP MEDEX EX OINT
TOPICAL_OINTMENT | CUTANEOUS | Status: AC
  Administered 2017-12-06: 11:00:00
  Filled 2017-12-06: qty 7

## 2017-12-06 MED ORDER — ONDANSETRON HCL 4 MG PO TABS: 4.0000 mg | ORAL_TABLET | Freq: Four times a day (QID) | ORAL | 0 refills | Status: DC | PRN

## 2017-12-06 MED ORDER — ACETAMINOPHEN 325 MG PO TABS: 325.0000 mg | ORAL_TABLET | Freq: Four times a day (QID) | ORAL | Status: DC | PRN

## 2017-12-06 MED ORDER — OXYCODONE HCL 5 MG PO TABS
5.0000 mg | ORAL_TABLET | ORAL | Status: DC | PRN
  Administered 2017-12-06 – 2017-12-12 (×23): 10 mg via ORAL
  Filled 2017-12-06 (×23): qty 2

## 2017-12-06 MED ORDER — DOCUSATE SODIUM 100 MG PO CAPS: 100.0000 mg | ORAL_CAPSULE | Freq: Two times a day (BID) | ORAL | 1 refills | Status: DC

## 2017-12-06 MED ORDER — OXYCODONE HCL 5 MG PO TABS: 5.0000 mg | ORAL_TABLET | Freq: Four times a day (QID) | ORAL | 0 refills | Status: DC | PRN

## 2017-12-06 MED ORDER — INSULIN GLARGINE 100 UNIT/ML ~~LOC~~ SOLN
6.0000 [IU] | Freq: Every day | SUBCUTANEOUS | Status: DC
  Administered 2017-12-06 – 2017-12-12 (×7): 6 [IU] via SUBCUTANEOUS
  Filled 2017-12-06 (×7): qty 0.06

## 2017-12-06 NOTE — Clinical Social Work Note (Addendum)
Clinical Social Work Assessment  Patient Details  Name: Elaine Lee MRN: [id number] Date of Birth: 11/07/1945  Date of referral:  12/06/17               Reason for consult:  Discharge Planning                Permission sought to share information with:  Case Manager Permission granted to share information::  Yes, Verbal Permission Granted  Name::        Agency:: SNF  Relationship::   [facility].   Contact Information:     Housing/Transportation Living arrangements for the past 2 months:  Skilled Nursing Facility Source of Information:  Patient Patient Interpreter Needed:  None Criminal Activity/Legal Involvement Pertinent to Current Situation/Hospitalization:  No - Comment as needed Significant Relationships:  Spouse, Adult Children  Lives with:  Spouse Do you feel safe going back to the place where you live?  No Need for family participation in patient care:  No (Coment)  Care giving concerns:   Patient is being admitted for right total knee arthroplasty.  [facility] community program is concerned about the patient going home with her spouse and son and their inability to care for her after surgery.  Ortho. Physician agreeable to SNF [facility].  Social Worker assessment / plan:  CSW received call from Charissa BashJulie Williams the [facility] physician at [facility]. The physician inform CSW [facility] has arranged for the [facility] to go [facility] for care and will do physical therapy with [facility] STAFF at their building. [facility] will transport the patient at discharge.  CSW actively working on the patient level II PASRR.  FL2 completed.   Plan: SNF  Employment status:  Disabled (Comment on whether or not currently receiving Disability) Insurance information:  Other (Comment Required)([facility]) PT Recommendations:  Skilled Nursing Facility Information / Referral to community resources:  Skilled Nursing Facility  Patient/Family's Response to care:  Agreeable and Responding well to  care.   Patient/Family's Understanding of and Emotional Response to Diagnosis, Current Treatment, and Prognosis: Patient understands her diagnosis and discharge plan. [facility] is active in her care.   Emotional Assessment Appearance:  Developmentally appropriate Attitude/Demeanor/Rapport:    Affect (typically observed):  Accepting, Pleasant Orientation:  Oriented to Self, Oriented to Place, Oriented to  Time, Oriented to Situation Alcohol / Substance use:  Not Applicable Psych involvement (Current and /or in the community):  No (Comment)  Discharge Needs  Concerns to be addressed:  Discharge Planning Concerns Readmission within the last 30 days:  No Current discharge risk:  Physical Impairment, Dependent with Mobility Barriers to Discharge:  Continued Medical Work up, Designer, jewelleryAwaiting State Approval ([place])   Clearance CootsNicole A Lennox Leikam, LCSW 12/06/2017, 1:24 PM

## 2017-12-06 NOTE — Progress Notes (Signed)
CSW faxed requested clinicals to [facility]. PASRR status is pending. Weekend CSW will follow up tomorrow.   Vivi BarrackNicole Buzz Axel, Alexander MtLCSW, MSW Clinical Social Worker  [phone] 12/06/2017  4:44 PM

## 2017-12-06 NOTE — Evaluation (Signed)
Physical Therapy Evaluation Patient Details Name: Elaine Lee MRN: [id number] DOB: 03/12/1945 Today's Date: 12/06/2017   History of Present Illness  Pt s/p R TKR and with hx of DM, COPD, CKD, CHF, A-fib, and diabetic neuropathy  Clinical Impression  Pt s/p R TKR and presents with decreased R LE strength/ROM, post op pain, decreased LE sensation and related balance deficits, obesity and premorbid deconditioning limiting functional mobility.    Follow Up Recommendations Follow surgeon's recommendation for DC plan and follow-up therapies    Equipment Recommendations  None recommended by PT    Recommendations for Other Services       Precautions / Restrictions Precautions Precautions: Knee;Fall Restrictions Weight Bearing Restrictions: No Other Position/Activity Restrictions: WBAT      Mobility  Bed Mobility Overal bed mobility: Needs Assistance Bed Mobility: Supine to Sit     Supine to sit: Min assist;Mod assist     General bed mobility comments: cues for sequence and use of L LE to self assist.  Transfers Overall transfer level: Needs assistance Equipment used: Rolling walker (2 wheeled) Transfers: Sit to/from Stand Sit to Stand: Min assist;Mod assist;From elevated surface         General transfer comment: cues for LE management and use of UEs to self assist  Ambulation/Gait Ambulation/Gait assistance: Min assist;Mod assist;+2 safety/equipment Gait Distance (Feet): 21 Feet Assistive device: Rolling walker (2 wheeled) Gait Pattern/deviations: Step-to pattern;Decreased step length - right;Decreased step length - left;Shuffle;Trunk flexed Gait velocity: decr   General Gait Details: cues for sequence, posture and position from [employer]            Wheelchair Mobility    Modified Rankin (Stroke Patients Only)       Balance Overall balance assessment: Needs assistance Sitting-balance support: No upper extremity supported;Feet  supported Sitting balance-Leahy Scale: Good     Standing balance support: Bilateral upper extremity supported Standing balance-Leahy Scale: Poor                               Pertinent Vitals/Pain Pain Assessment: 0-10 Pain Score: 5  Pain Location: R knee Pain Descriptors / Indicators: Aching;Sore Pain Intervention(s): Limited activity within patient's tolerance;Monitored during session;Premedicated before session;Ice applied    Home Living Family/patient expects to be discharged to:: Private residence Living Arrangements: Spouse/significant other;Children Available Help at Discharge: Family Type of Home: House Home Access: Ramped entrance     Home Layout: One level Home Equipment: Wheelchair - [employer] - 2 wheels;Walker - 4 wheels;Cane - single point;Grab bars - toilet;Grab bars - tub/shower      Prior Function Level of Independence: Independent with assistive device(s)               Hand Dominance   Dominant Hand: Right    Extremity/Trunk Assessment   Upper Extremity Assessment Upper Extremity Assessment: Overall WFL for tasks assessed    Lower Extremity Assessment Lower Extremity Assessment: RLE deficits/detail RLE Deficits / Details: 3/5 quads with AAROM at knee -10 - 70    Cervical / Trunk Assessment Cervical / Trunk Assessment: Kyphotic  Communication   Communication: HOH  Cognition Arousal/Alertness: Awake/alert Behavior During Therapy: WFL for tasks assessed/performed Overall Cognitive Status: Within Functional Limits for tasks assessed  General Comments      Exercises Total Joint Exercises Ankle Circles/Pumps: AROM;Both;15 reps;Supine Quad Sets: AROM;Both;10 reps;Supine Heel Slides: AAROM;Right;15 reps;Supine Straight Leg Raises: AAROM;AROM;Right;15 reps;Supine   Assessment/Plan    PT Assessment Patient needs continued PT services  PT Problem List Decreased  strength;Decreased range of motion;Decreased activity tolerance;Decreased balance;Decreased mobility;Decreased knowledge of use of DME;Obesity;Pain       PT Treatment Interventions DME instruction;Gait training;Functional mobility training;Therapeutic activities;Therapeutic exercise;Balance training;Patient/family education    PT Goals (Current goals can be found in the Care Plan section)  Acute Rehab PT Goals Patient Stated Goal: Regain IND PT Goal Formulation: With patient Time For Goal Achievement: 12/13/17 Potential to Achieve Goals: Fair    Frequency 7X/week   Barriers to discharge        Co-evaluation               AM-PAC PT "6 Clicks" Mobility  Outcome Measure Help needed turning from your back to your side while in a flat bed without using bedrails?: A Lot Help needed moving from lying on your back to sitting on the side of a flat bed without using bedrails?: A Lot Help needed moving to and from a bed to a chair (including a wheelchair)?: A Lot Help needed standing up from a chair using your arms (e.g., wheelchair or bedside chair)?: A Lot Help needed to walk in hospital room?: A Lot Help needed climbing 3-5 steps with a railing? : A Lot 6 Click Score: 12    End of Session Equipment Utilized During Treatment: Gait belt Activity Tolerance: Patient tolerated treatment well Patient left: in chair;with call bell/phone within reach;with chair alarm set Nurse Communication: Mobility status PT Visit Diagnosis: Difficulty in walking, not elsewhere classified (R26.2)    Time: [id number] PT Time Calculation (min) (ACUTE ONLY): 45 min   Charges:   PT Evaluation $PT Eval Low Complexity: 1 Low PT Treatments $Gait Training: 8-22 mins $Therapeutic Exercise: 8-22 mins        Mauro KaufmannHunter Pratham Cassatt PT Acute Rehabilitation Services Pager [phone] Office [phone]   Jeidy Hoerner 12/06/2017, 12:50 PM

## 2017-12-06 NOTE — [place] FL2 (Signed)
[place] MEDICAID FL2 LEVEL OF CARE SCREENING TOOL     IDENTIFICATION  Patient Name: Elaine Lee Birthdate: 10-16-1945 Sex: female Admission Date (Current Location): 12/05/2017  [facility] [place]   Facility and Address:  [facility],  [address], [place] [id number]      Provider Number: [id number]  Attending Physician Name and Address:  Samson FredericSwinteck, Brian, MD  Relative Name and Phone Number:       Current Level of Care: Hospital Recommended Level of Care: Skilled Nursing Facility Prior Approval Number:    Date Approved/Denied:   PASRR Number:    Discharge Plan: SNF    Current Diagnoses: Patient Active Problem List   Diagnosis Date Noted  . Osteoarthritis of right knee 12/05/2017  . Infection due to parainfluenza virus 3 06/27/2017  . Atrial fibrillation with RVR (HCC) 06/24/2017  . UTI (urinary tract infection) 04/05/2016  . Pleuritic chest pain 04/05/2016  . AKI (acute kidney injury) (HCC) 04/05/2016  . Tremor 04/05/2016  . Fall   . Asthma flare 10/25/2014  . OSA (obstructive sleep apnea) 10/25/2014  . Morbid obesity (HCC) 10/25/2014  . Acute on chronic respiratory failure with hypoxia (HCC)   . DM2 (diabetes mellitus, type 2) (HCC) 06/14/2014  . CKD (chronic kidney disease), stage III (HCC) 06/14/2014  . Neuropathic pain 06/14/2014  . HTN (hypertension) 06/14/2014  . Type 2 diabetes with stage 3 chronic kidney disease GFR 30-59 (HCC)   . COPD exacerbation (HCC) 06/13/2014  . Acute exacerbation of CHF (congestive heart failure) (HCC) 06/13/2014    Orientation RESPIRATION BLADDER Height & Weight     Self, Time, Situation, Place  Normal Continent Weight: 233 lb 8 oz (105.9 kg) Height:  5\' 5"  (165.1 cm)  BEHAVIORAL SYMPTOMS/MOOD NEUROLOGICAL BOWEL NUTRITION STATUS      Continent Diet(Carb Modified. )  AMBULATORY STATUS COMMUNICATION OF NEEDS Skin   Extensive Assist Verbally Surgical wounds                    Personal Care Assistance Level of Assistance  Bathing, Feeding, Dressing Bathing Assistance: Maximum assistance Feeding assistance: Independent Dressing Assistance: Maximum assistance     Functional Limitations Info  Sight, Hearing, Speech Sight Info: Impaired Hearing Info: Adequate Speech Info: Adequate    SPECIAL CARE FACTORS FREQUENCY  PT (By licensed PT), OT (By licensed OT)     PT Frequency: 7x/week OT Frequency: 7x/week            Contractures Contractures Info: Not present    Additional Factors Info  Code Status, Allergies, Psychotropic, Insulin Sliding Scale Code Status Info: Fullcode  Allergies Info: Allergies: Other, Tape, Trazodone And Nefazodone, Celebrex Celecoxib, Latex, Mysoline Primidone, Niacin And Related, Sulfa Antibiotics, Aspirin   Insulin Sliding Scale Info: See medication list        Current Medications (12/06/2017):  This is the current hospital active medication list Current Facility-Administered Medications  Medication Dose Route Frequency Provider Last Rate Last Dose  . 0.9 %  sodium chloride infusion   Intravenous Continuous Swinteck, Arlys JohnBrian, MD   Stopped at 12/06/17 1125  . acetaminophen (TYLENOL) tablet 325-650 mg  325-650 mg Oral Q6H PRN Swinteck, Arlys JohnBrian, MD      . albuterol (PROVENTIL) (2.5 MG/3ML) 0.083% nebulizer solution 3 mL  3 mL Nebulization TID PRN Swinteck, Arlys JohnBrian, MD      . alum & mag hydroxide-simeth (MAALOX/MYLANTA) 200-200-20 MG/5ML suspension 30 mL  30 mL Oral Q4H PRN Samson FredericSwinteck, Brian, MD      .  apixaban (ELIQUIS) tablet 2.5 mg  2.5 mg Oral Q12H Samson Frederic, MD   2.5 mg at 12/06/17 0849  . atorvastatin (LIPITOR) tablet 10 mg  10 mg Oral QHS Samson Frederic, MD   10 mg at 12/05/17 2142  . benzonatate (TESSALON) capsule 100 mg  100 mg Oral TID PRN Samson Frederic, MD      . citalopram (CELEXA) tablet 20 mg  20 mg Oral Daily Samson Frederic, MD   20 mg at 12/06/17 0852  . diltiazem (CARDIZEM CD) 24 hr capsule 240 mg  240 mg  Oral Daily Samson Frederic, MD   240 mg at 12/06/17 0854  . diphenhydrAMINE (BENADRYL) 12.5 MG/5ML elixir 12.5-25 mg  12.5-25 mg Oral Q4H PRN Swinteck, Arlys John, MD      . docusate sodium (COLACE) capsule 100 mg  100 mg Oral BID Samson Frederic, MD   100 mg at 12/06/17 0855  . famotidine (PEPCID) tablet 20 mg  20 mg Oral BID Samson Frederic, MD   20 mg at 12/06/17 0853  . fesoterodine (TOVIAZ) tablet 4 mg  4 mg Oral Daily Swinteck, Arlys John, MD   4 mg at 12/06/17 0859  . furosemide (LASIX) tablet 40 mg  40 mg Oral Daily Samson Frederic, MD   40 mg at 12/06/17 0851  . gabapentin (NEURONTIN) capsule 600 mg  600 mg Oral q morning - 10a Samson Frederic, MD   600 mg at 12/06/17 1610   And  . gabapentin (NEURONTIN) capsule 1,200 mg  1,200 mg Oral QHS Samson Frederic, MD   1,200 mg at 12/05/17 2143  . guaiFENesin (MUCINEX) 12 hr tablet 600 mg  600 mg Oral BID Samson Frederic, MD   600 mg at 12/06/17 0849  . HYDROmorphone (DILAUDID) injection 0.5-1 mg  0.5-1 mg Intravenous Q2H PRN Samson Frederic, MD   1 mg at 12/06/17 1122  . insulin aspart (novoLOG) injection 0-15 Units  0-15 Units Subcutaneous TID WC Samson Frederic, MD   3 Units at 12/06/17 [phone]  . insulin glargine (LANTUS) injection 6 Units  6 Units Subcutaneous Daily Swinteck, Arlys John, MD      . liraglutide (VICTOZA) SOPN 1.2 mg  1.2 mg Subcutaneous Daily Swinteck, Arlys John, MD      . loratadine (CLARITIN) tablet 10 mg  10 mg Oral q morning - 10a Samson Frederic, MD   10 mg at 12/06/17 0853  . losartan (COZAAR) tablet 100 mg  100 mg Oral Daily Samson Frederic, MD   100 mg at 12/06/17 0850  . menthol-cetylpyridinium (CEPACOL) lozenge 3 mg  1 lozenge Oral PRN Swinteck, Arlys John, MD       Or  . phenol (CHLORASEPTIC) mouth spray 1 spray  1 spray Mouth/Throat PRN Swinteck, Arlys John, MD      . methocarbamol (ROBAXIN) tablet 500 mg  500 mg Oral Q6H PRN Samson Frederic, MD   500 mg at 12/06/17 0848   Or  . methocarbamol (ROBAXIN) 500 mg in dextrose 5 % 50 mL IVPB  500 mg  Intravenous Q6H PRN Swinteck, Arlys John, MD      . metoCLOPramide (REGLAN) tablet 5-10 mg  5-10 mg Oral Q8H PRN Swinteck, Arlys John, MD       Or  . metoCLOPramide (REGLAN) injection 5-10 mg  5-10 mg Intravenous Q8H PRN Swinteck, Arlys John, MD      . mometasone-formoterol (DULERA) 200-5 MCG/ACT inhaler 2 puff  2 puff Inhalation BID Samson Frederic, MD   2 puff at 12/05/17 2128  . montelukast (SINGULAIR) tablet 10 mg  10 mg  Oral Abbe Amsterdam, MD   10 mg at 12/05/17 2143  . ondansetron (ZOFRAN) tablet 4 mg  4 mg Oral Q6H PRN Swinteck, Arlys John, MD       Or  . ondansetron (ZOFRAN) injection 4 mg  4 mg Intravenous Q6H PRN Swinteck, Arlys John, MD      . oxyCODONE (Oxy IR/ROXICODONE) immediate release tablet 5-10 mg  5-10 mg Oral Q4H PRN Swinteck, Arlys John, MD      . polyethylene glycol (MIRALAX / GLYCOLAX) packet 17 g  17 g Oral Daily PRN Swinteck, Arlys John, MD      . polyethylene glycol (MIRALAX / GLYCOLAX) packet 17 g  17 g Oral Daily PRN Swinteck, Arlys John, MD      . rOPINIRole (REQUIP) tablet 1 mg  1 mg Oral QHS Samson Frederic, MD   1 mg at 12/05/17 2143  . senna (SENOKOT) tablet 8.6 mg  1 tablet Oral BID Samson Frederic, MD   8.6 mg at 12/06/17 0854  . simethicone (MYLICON) chewable tablet 80 mg  80 mg Oral Q6H PRN Samson Frederic, MD         Discharge Medications: Please see discharge summary for a list of discharge medications.  Relevant Imaging Results:  Relevant Lab Results:   Additional Information SSN: [id number]  Clearance Coots, [place]

## 2017-12-06 NOTE — Progress Notes (Signed)
    Subjective:  Patient reports pain as moderate.  Denies N/V/CP/SOB. No c/o.  Objective:   VITALS:   Vitals:   12/06/17 0143 12/06/17 0504 12/06/17 0848 12/06/17 0903  BP: (!) 146/80 138/81 (!) 129/114 110/69  Pulse: 71 61 72 65  Resp: 16 14  16   Temp: 98.4 F (36.9 C) 98.2 F (36.8 C)  98 F (36.7 C)  TempSrc: Oral Oral    SpO2: 97% 95%    Weight:      Height:        NAD ABD soft Sensation intact distally Intact pulses distally Dorsiflexion/Plantar flexion intact Incision: dressing C/D/I Compartment soft   Lab Results  Component Value Date   WBC 13.3 (H) 12/06/2017   HGB 9.3 (L) 12/06/2017   HCT 30.4 (L) 12/06/2017   MCV 92.4 12/06/2017   PLT 201 12/06/2017   BMET    Component Value Date/Time   NA 139 12/06/2017 0614   K 4.3 12/06/2017 0614   CL 106 12/06/2017 0614   CO2 25 12/06/2017 0614   GLUCOSE 210 (H) 12/06/2017 0614   BUN 17 12/06/2017 0614   CREATININE 0.73 12/06/2017 0614   CALCIUM 8.8 (L) 12/06/2017 0614   GFRNONAA >60 12/06/2017 0614   GFRAA >60 12/06/2017 16100614     Assessment/Plan: 1 Day Post-Op   Principal Problem:   Osteoarthritis of right knee   WBAT with walker DVT ppx: Eliquis, SCDs, TEDS PO pain control PT/OT DM2: DM coordinator on board, will add lantus per recs Dispo: d/c to [facility] tomorrow due to home situation   Jonette PesaBrian J Tawanda Schall 12/06/2017, 12:11 PM   Samson FredericBrian Iona Stay, MD Cell: [phone] [facility] now [facility]  [place] [address], [place], [place] [address] Phone: [phone] [email or web address] Facebook  [employer]  LinkedIn  Twitter

## 2017-12-06 NOTE — Progress Notes (Signed)
At 1145, Swinteck, MD gave verbal orders for SNF placement if needed for a safer choice for pt's rehab/recovery. Pt was originally being d/c home, but the pt reported not having reliable help d/t her Husband and Son being alcoholics.

## 2017-12-06 NOTE — Progress Notes (Signed)
Inpatient Diabetes Program Recommendations  AACE/ADA: New Consensus Statement on Inpatient Glycemic Control (2015)  Target Ranges:  Prepandial:   less than 140 mg/dL      Peak postprandial:   less than 180 mg/dL (1-2 hours)      Critically ill patients:  140 - 180 mg/dL   Results for Larey BrickSTAPLETON, Alexarae K (MRN [id number]) as of 12/06/2017 08:38  Ref. Range 12/05/2017 09:11 12/05/2017 13:48 12/05/2017 16:40 12/05/2017 21:04  Glucose-Capillary Latest Ref Range: 70 - 99 mg/dL 409111 (H)  10 mg Decadron at 11am 153 (H) 206 (H)  5 units NOVOLOG  261 (H)   Results for Larey BrickSTAPLETON, Clorissa K (MRN [id number]) as of 12/06/2017 08:38  Ref. Range 12/06/2017 07:35  Glucose-Capillary Latest Ref Range: 70 - 99 mg/dL 956199 (H)  3 units NOVOLOG    Results for Larey BrickSTAPLETON, Huntley K (MRN [id number]) as of 12/06/2017 08:38  Ref. Range 06/25/2017 06:09 12/05/2017 08:24  Hemoglobin A1C Latest Ref Range: 4.8 - 5.6 % 7.1 (H) 6.6 (H)    Admit with: R Total Knee  History: DM  Home DM Meds: Basaglar 6 units Daily (insulin glargine)       Victoza 1.2 mg Daily  Current Orders: Novolog Moderate Correction Scale/ SSI (0-15 units) TID AC      Victoza 1.2 mg Daily      MD- Please consider starting Lantus 6 units Daily (home dose)    --Will follow patient during hospitalization--  Ambrose FinlandJeannine Johnston Melane Windholz RN, MSN, CDE Diabetes Coordinator Inpatient Glycemic Control Team Team Pager: [phone] (8a-5p)

## 2017-12-06 NOTE — Discharge Summary (Addendum)
Physician Discharge Summary  Patient ID: Elaine Lee MRN: [id number] DOB/AGE: [birth year] 72 y.o.  Admit date: 12/05/2017 Discharge date: 12/12/17  Admission Diagnoses:  Osteoarthritis of right knee  Discharge Diagnoses:  Principal Problem:   Osteoarthritis of right knee   Past Medical History:  Diagnosis Date  . A-fib (HCC)   . Asthma   . CHF (congestive heart failure) (HCC)   . Chronic edema   . CKD (chronic kidney disease), stage III (HCC)   . COPD (chronic obstructive pulmonary disease) (HCC)   . Degenerative lumbar disc   . Depression   . Diabetes mellitus without complication (HCC)   . Diabetic neuropathy (HCC)   . GERD (gastroesophageal reflux disease)   . Gout   . Hearing loss   . Hypercholesteremia   . Hypertension   . Insomnia   . Obstructive sleep apnea on CPAP   . Osteoarthritis    knees and shoulders  . Renal disorder   . Urinary frequency     Surgeries: Procedure(s): COMPUTER ASSISTED TOTAL KNEE ARTHROPLASTY on 12/05/2017   Consultants (if any):   Discharged Condition: Improved  Hospital Course: [name] is an 72 y.o. female who was admitted 12/05/2017 with a diagnosis of Osteoarthritis of right knee and went to the operating room on 12/05/2017 and underwent the above named procedures.    She was given perioperative antibiotics:  Anti-infectives (From admission, onward)   Start     Dose/Rate Route Frequency Ordered Stop   12/05/17 1730  ceFAZolin (ANCEF) IVPB 2g/100 mL premix     2 g 200 mL/hr over 30 Minutes Intravenous Every 6 hours 12/05/17 1447 12/05/17 2357   12/05/17 0830  ceFAZolin (ANCEF) IVPB 2g/100 mL premix     2 g 200 mL/hr over 30 Minutes Intravenous On call to O.R. 12/05/17 4098 12/05/17 1121    .  She was given sequential compression devices, early ambulation, and apixaban for DVT prophylaxis.  She benefited maximally from the hospital stay and there were no complications.    Recent vital signs:  Vitals:    12/12/17 0555 12/12/17 0847  BP: (!) 127/57 (!) 116/53  Pulse: (!) 55 72  Resp: 16   Temp: 98.5 F (36.9 C)   SpO2: 95%     Recent laboratory studies:  Lab Results  Component Value Date   HGB 8.7 (L) 12/08/2017   HGB 8.5 (L) 12/07/2017   HGB 9.3 (L) 12/06/2017   Lab Results  Component Value Date   WBC 10.8 (H) 12/08/2017   PLT 211 12/08/2017   No results found for: INR Lab Results  Component Value Date   NA 139 12/06/2017   K 4.3 12/06/2017   CL 106 12/06/2017   CO2 25 12/06/2017   BUN 17 12/06/2017   CREATININE 0.73 12/06/2017   GLUCOSE 210 (H) 12/06/2017    Discharge Medications:   Allergies as of 12/12/2017      Reactions   Other Shortness Of Breath, Other (See Comments)   ROSEMARY AND HEAVY SCENTED PERFUMES, DETERGENTS, ETC   Tape Itching, Rash, Other (See Comments)   Burns and blisters USE PAPER TAPE   Trazodone And Nefazodone Shortness Of Breath, Swelling   Celebrex [celecoxib] Other (See Comments)   HARD TIME HEARING, LIKE BEING UNDERWATER   Latex Hives, Itching, Rash   Mysoline [primidone] Other (See Comments)   unknown   Niacin And Related Hives, Swelling   Sulfa Antibiotics Other (See Comments)   TONGUE BLISTERS    Aspirin Other (  See Comments)   GI PROBLEMS AND PAIN      Medication List    STOP taking these medications   acetaminophen 650 MG CR tablet Commonly known as:  TYLENOL Replaced by:  acetaminophen 325 MG tablet   BIOFREEZE 4 % Gel Generic drug:  Menthol (Topical Analgesic)   traMADol 50 MG tablet Commonly known as:  ULTRAM     TAKE these medications   acetaminophen 325 MG tablet Commonly known as:  TYLENOL Take 1-2 tablets (325-650 mg total) by mouth every 6 (six) hours as needed for mild pain (pain score 1-3 or temp > 100.5). Replaces:  acetaminophen 650 MG CR tablet   albuterol 0.63 MG/3ML nebulizer solution Commonly known as:  ACCUNEB Take 1 ampule by nebulization 3 (three) times daily as needed for wheezing or shortness  of breath.   antiseptic oral rinse Liqd 15 mLs by Mouth Rinse route 2 (two) times daily.   atorvastatin 10 MG tablet Commonly known as:  LIPITOR Take 10 mg by mouth at bedtime.   benzonatate 100 MG capsule Commonly known as:  TESSALON Take 100 mg by mouth 3 (three) times daily as needed for cough.   budesonide-formoterol 160-4.5 MCG/ACT inhaler Commonly known as:  SYMBICORT Inhale 2 puffs into the lungs 2 (two) times daily.   citalopram 20 MG tablet Commonly known as:  CELEXA Take 20 mg by mouth daily.   diltiazem 240 MG 24 hr capsule Commonly known as:  CARDIZEM CD Take 240 mg by mouth daily.   docusate sodium 100 MG capsule Commonly known as:  COLACE Take 1 capsule (100 mg total) by mouth 2 (two) times daily.   ELIQUIS 5 MG Tabs tablet Generic drug:  apixaban Take 5 mg by mouth 2 (two) times daily.   furosemide 40 MG tablet Commonly known as:  LASIX Take 40 mg by mouth daily.   gabapentin 600 MG tablet Commonly known as:  NEURONTIN Take 1 tablet (600 mg total) by mouth 2 (two) times daily. What changed:    how much to take  when to take this  additional instructions   guaiFENesin 600 MG 12 hr tablet Commonly known as:  MUCINEX Take 600 mg by mouth 2 (two) times daily.   BASAGLAR KWIKPEN 100 UNIT/ML Sopn Inject 6 Units into the skin daily.   insulin glargine 100 UNIT/ML injection Commonly known as:  LANTUS Inject 0.28 mLs (28 Units total) into the skin daily.   ipratropium-albuterol 0.5-2.5 (3) MG/3ML Soln Commonly known as:  DUONEB Take 3 mLs by nebulization 4 (four) times daily.   loratadine 10 MG tablet Commonly known as:  CLARITIN Take 10 mg by mouth every morning.   losartan 100 MG tablet Commonly known as:  COZAAR Take 100 mg by mouth daily.   Melatonin 5 MG Tabs Take 2 tablets (10 mg total) by mouth at bedtime.   montelukast 10 MG tablet Commonly known as:  SINGULAIR Take 1 tablet (10 mg total) by mouth at bedtime.   nystatin  powder Generic drug:  nystatin Apply 1 g topically 4 (four) times daily as needed (for skin folds).   ondansetron 4 MG tablet Commonly known as:  ZOFRAN Take 1 tablet (4 mg total) by mouth every 6 (six) hours as needed for nausea.   oxyCODONE 5 MG immediate release tablet Commonly known as:  Oxy IR/ROXICODONE Take 1-2 tablets (5-10 mg total) by mouth every 6 (six) hours as needed for moderate pain or severe pain.   polyethylene glycol packet Commonly  known as:  MIRALAX / GLYCOLAX Take 17 g by mouth daily. What changed:    when to take this  reasons to take this   ranitidine 150 MG tablet Commonly known as:  ZANTAC Take 150 mg by mouth 2 (two) times daily.   rOPINIRole 0.5 MG tablet Commonly known as:  REQUIP Take 1 mg by mouth at bedtime.   senna 8.6 MG Tabs tablet Commonly known as:  SENOKOT Take 2 tablets (17.2 mg total) by mouth at bedtime. What changed:    how much to take  when to take this   simethicone 125 MG chewable tablet Commonly known as:  MYLICON Chew 125 mg by mouth every 6 (six) hours as needed for flatulence.   TOVIAZ 4 MG Tb24 tablet Generic drug:  fesoterodine Take 4 mg by mouth daily.   VICTOZA 18 MG/3ML Sopn Generic drug:  liraglutide Inject 1.2 mg into the skin daily.   VISINE 0.05 % ophthalmic solution Generic drug:  tetrahydrozoline Place 1 drop into both eyes 4 (four) times daily as needed (for eye irritation).       Diagnostic Studies: Dg Chest 2 View  Result Date: 11/22/2017 CLINICAL DATA:  Persistent cough, COPD EXAM: CHEST - 2 VIEW COMPARISON:  06/26/2017 chest radiograph. FINDINGS: Stable cardiomediastinal silhouette with mild cardiomegaly. There is asymmetric mild prominence of the left hilum, increased compared to the most recent PA chest radiograph comparison of 06/03/2015. No pneumothorax. No pleural effusion. Hyperinflated lungs. No overt pulmonary edema. IMPRESSION: Asymmetric mild prominence of the left hilum, increased  compared to the most recent PA chest radiograph comparison of 06/03/2015. Chest CT with IV contrast recommended to exclude adenopathy or central lung mass. Hyperinflated lungs, compatible with the provided history of COPD. These results will be called to the ordering clinician or representative by the Radiology Department at the imaging location. Electronically Signed   By: Delbert Phenix M.D.   On: 11/22/2017 15:23   Dg Knee Right Port  Result Date: 12/05/2017 CLINICAL DATA:  Postop right total hip replacement EXAM: PORTABLE RIGHT KNEE - 1-2 VIEW COMPARISON:  04/04/2016 FINDINGS: Right total knee arthroplasty is anatomically aligned. There is no breakage or loosening of the hardware. IMPRESSION: Right total knee arthroplasty anatomically aligned. Electronically Signed   By: Jolaine Click M.D.   On: 12/05/2017 15:07    Disposition: Discharge disposition: 03-Skilled Nursing Facility       Discharge Instructions    Call MD / Call 911   Complete by:  As directed    If you experience chest pain or shortness of breath, CALL 911 and be transported to the hospital emergency room.  If you develope a fever above 101 F, pus (white drainage) or increased drainage or redness at the wound, or calf pain, call your surgeon's office.   Constipation Prevention   Complete by:  As directed    Drink plenty of fluids.  Prune juice may be helpful.  You may use a stool softener, such as Colace (over the counter) 100 mg twice a day.  Use MiraLax (over the counter) for constipation as needed.   Diet - low sodium heart healthy   Complete by:  As directed    Increase activity slowly as tolerated   Complete by:  As directed        Contact information for follow-up providers    Felicidad Sugarman, Arlys John, MD. Schedule an appointment as soon as possible for a visit in 2 weeks.   Specialty:  Orthopedic Surgery Why:  For wound re-check Contact information: [address] [place] [address] [phone]             Contact information for after-discharge care    Destination    [facility] Preferred SNF .   Service:  Skilled Nursing Contact information: [address] [place] [id number] [phone]                   Signed: Jonette PesaBrian J Kahli Mayon 12/12/2017, 9:51 AM

## 2017-12-07 LAB — GLUCOSE, CAPILLARY
Glucose-Capillary: 203 mg/dL — ABNORMAL HIGH (ref 70–99)
Glucose-Capillary: 253 mg/dL — ABNORMAL HIGH (ref 70–99)
Glucose-Capillary: 205 mg/dL — ABNORMAL HIGH (ref 70–99)
Glucose-Capillary: 210 mg/dL — ABNORMAL HIGH (ref 70–99)

## 2017-12-07 LAB — CBC
HCT: 27.4 % — ABNORMAL LOW (ref 36.0–46.0)
Hemoglobin: 8.5 g/dL — ABNORMAL LOW (ref 12.0–15.0)
MCH: 28.1 pg (ref 26.0–34.0)
MCHC: 31 g/dL (ref 30.0–36.0)
MCV: 90.4 fL (ref 80.0–100.0)
Platelets: 201 10*3/uL (ref 150–400)
RBC: 3.03 MIL/uL — ABNORMAL LOW (ref 3.87–5.11)
RDW: 13.6 % (ref 11.5–15.5)
WBC: 13.8 10*3/uL — ABNORMAL HIGH (ref 4.0–10.5)
nRBC: 0 % (ref 0.0–0.2)

## 2017-12-07 NOTE — Progress Notes (Addendum)
Subjective: 2 Days Post-Op Procedure(s) (LRB): COMPUTER ASSISTED TOTAL KNEE ARTHROPLASTY (Right) Patient reports pain as 2 on 0-10 scale.   No cp sob Objective: Vital signs in last 24 hours: Temp:  [98 F (36.7 C)-99.1 F (37.3 C)] 99.1 F (37.3 C) (12/07 0544) Pulse Rate:  [49-72] 49 (12/07 0544) Resp:  [16-20] 18 (12/07 0544) BP: (110-134)/(64-114) 133/68 (12/07 0544) SpO2:  [92 %-95 %] 93 % (12/07 0544)  Intake/Output from previous day: 12/06 0701 - 12/07 0700 In: 1363.9 [P.O.:960; I.V.:403.9] Out: 750 [Urine:750] Intake/Output this shift: Total I/O In: -  Out: 150 [Urine:150]  Recent Labs    12/06/17 0614 12/07/17 0339  HGB 9.3* 8.5*   Recent Labs    12/06/17 0614 12/07/17 0339  WBC 13.3* 13.8*  RBC 3.29* 3.03*  HCT 30.4* 27.4*  PLT 201 201   Recent Labs    12/06/17 0614  NA 139  K 4.3  CL 106  CO2 25  BUN 17  CREATININE 0.73  GLUCOSE 210*  CALCIUM 8.8*   No results for input(s): LABPT, INR in the last 72 hours.  Neurologically intact Sensation intact distally Incision: dressing C/D/I Mild swelling. No DVT.    Assessment/Plan: 2 Days Post-Op Procedure(s) (LRB): COMPUTER ASSISTED TOTAL KNEE ARTHROPLASTY (Right) Advance diet Up with therapy D/C IV fluids Discharge to SNF Gluc 210. Strict diet etc.  Javier DockerJeffrey C Vinaya Sancho 12/07/2017, 8:09 AM

## 2017-12-07 NOTE — Progress Notes (Signed)
Physical Therapy Treatment Patient Details Name: Elaine Lee MRN: [id number] DOB: July 13, 1945 Today's Date: 12/07/2017    History of Present Illness Pt s/p R TKR and with hx of DM, COPD, CKD, CHF, A-fib, and diabetic neuropathy    PT Comments    Pt performed therex program with increased time and assist.  OOB deferred to later at pt request "I need to recover first".   Follow Up Recommendations  Follow surgeon's recommendation for DC plan and follow-up therapies     Equipment Recommendations  None recommended by PT    Recommendations for Other Services       Precautions / Restrictions Precautions Precautions: Knee;Fall Restrictions Weight Bearing Restrictions: No Other Position/Activity Restrictions: WBAT    Mobility  Bed Mobility               General bed mobility comments: OOB deferred at pt request to laterr  Transfers                    Ambulation/Gait                 Stairs             Wheelchair Mobility    Modified Rankin (Stroke Patients Only)       Balance                                            Cognition Arousal/Alertness: Awake/alert Behavior During Therapy: WFL for tasks assessed/performed Overall Cognitive Status: Within Functional Limits for tasks assessed                                        Exercises Total Joint Exercises Ankle Circles/Pumps: AROM;Both;15 reps;Supine Quad Sets: AROM;Both;Supine;15 reps Heel Slides: AAROM;Right;Supine;20 reps Straight Leg Raises: AAROM;AROM;Right;Supine;20 reps Goniometric ROM: AAROM R knee -10 - 75    General Comments        Pertinent Vitals/Pain Pain Assessment: 0-10 Pain Score: 6  Pain Location: R knee Pain Descriptors / Indicators: Aching;Sore Pain Intervention(s): Limited activity within patient's tolerance;Monitored during session;Premedicated before session;Ice applied;Patient requesting pain meds-RN notified     Home Living                      Prior Function            PT Goals (current goals can now be found in the care plan section) Acute Rehab PT Goals Patient Stated Goal: Regain IND PT Goal Formulation: With patient Time For Goal Achievement: 12/13/17 Potential to Achieve Goals: Fair Progress towards PT goals: Progressing toward goals    Frequency    7X/week      PT Plan Current plan remains appropriate    Co-evaluation              AM-PAC PT "6 Clicks" Mobility   Outcome Measure  Help needed turning from your back to your side while in a flat bed without using bedrails?: A Lot Help needed moving from lying on your back to sitting on the side of a flat bed without using bedrails?: A Lot Help needed moving to and from a bed to a chair (including a wheelchair)?: A Lot Help needed standing up from a chair using your arms (e.g., wheelchair or bedside  chair)?: A Lot Help needed to walk in hospital room?: A Lot Help needed climbing 3-5 steps with a railing? : A Lot 6 Click Score: 12    End of Session   Activity Tolerance: Patient limited by pain;Patient limited by fatigue Patient left: with call bell/phone within reach;in bed Nurse Communication: Mobility status PT Visit Diagnosis: Difficulty in walking, not elsewhere classified (R26.2)     Time: [id number] PT Time Calculation (min) (ACUTE ONLY): 24 min  Charges:  $Therapeutic Exercise: 23-37 mins                     Elaine KaufmannHunter Halaina Lee PT Acute Rehabilitation Services Pager [phone] Office [phone]    Elaine Lee 12/07/2017, 10:03 AM

## 2017-12-07 NOTE — Progress Notes (Signed)
Patient's PASRR still pending - patient cannot d/c to SNF without approved number.  Will continue to check for updates and follow for d/c coordination.  Enid CutterLindsey Jaiveon Suppes, MSW, LCSWA Clinical Social Work [phone]

## 2017-12-07 NOTE — Progress Notes (Signed)
Physical Therapy Treatment Patient Details Name: Elaine BrickMelanie K Berryhill MRN: [id number] DOB: 09/05/1945 Today's Date: 12/07/2017    History of Present Illness Pt s/p R TKR and with hx of DM, COPD, CKD, CHF, A-fib, and diabetic neuropathy    PT Comments    Pt continues cooperative and progressing slowly but steadily with mobility.   Follow Up Recommendations  Follow surgeon's recommendation for DC plan and follow-up therapies;SNF     Equipment Recommendations  None recommended by PT    Recommendations for Other Services       Precautions / Restrictions Precautions Precautions: Knee;Fall Restrictions Weight Bearing Restrictions: No Other Position/Activity Restrictions: WBAT    Mobility  Bed Mobility Overal bed mobility: Needs Assistance Bed Mobility: Supine to Sit     Supine to sit: Min assist     General bed mobility comments: cues for sequence; HOB elevated and pt utilizing bed rail  Transfers Overall transfer level: Needs assistance Equipment used: Rolling walker (2 wheeled) Transfers: Sit to/from Stand Sit to Stand: Min assist;Mod assist;From elevated surface         General transfer comment: cues for LE management and use of UEs to self assist  Ambulation/Gait Ambulation/Gait assistance: Min assist;Mod assist Gait Distance (Feet): 55 Feet Assistive device: Rolling walker (2 wheeled) Gait Pattern/deviations: Step-to pattern;Decreased step length - right;Decreased step length - left;Shuffle;Trunk flexed Gait velocity: decr   General Gait Details: cues for sequence, posture and position from [employer]W   Stairs             Wheelchair Mobility    Modified Rankin (Stroke Patients Only)       Balance Overall balance assessment: Needs assistance Sitting-balance support: No upper extremity supported;Feet supported Sitting balance-Leahy Scale: Good     Standing balance support: Bilateral upper extremity supported Standing balance-Leahy Scale: Poor                               Cognition Arousal/Alertness: Awake/alert Behavior During Therapy: WFL for tasks assessed/performed Overall Cognitive Status: Within Functional Limits for tasks assessed                                        Exercises      General Comments        Pertinent Vitals/Pain Pain Assessment: 0-10 Pain Score: 6  Pain Location: R knee Pain Descriptors / Indicators: Aching;Sore Pain Intervention(s): Limited activity within patient's tolerance;Monitored during session;Premedicated before session;Ice applied    Home Living                      Prior Function            PT Goals (current goals can now be found in the care plan section) Acute Rehab PT Goals Patient Stated Goal: Regain IND PT Goal Formulation: With patient Time For Goal Achievement: 12/13/17 Potential to Achieve Goals: Fair Progress towards PT goals: Progressing toward goals    Frequency    7X/week      PT Plan Current plan remains appropriate;Discharge plan needs to be updated    Co-evaluation              AM-PAC PT "6 Clicks" Mobility   Outcome Measure  Help needed turning from your back to your side while in a flat bed without using bedrails?: A Lot Help needed  moving from lying on your back to sitting on the side of a flat bed without using bedrails?: A Lot Help needed moving to and from a bed to a chair (including a wheelchair)?: A Lot Help needed standing up from a chair using your arms (e.g., wheelchair or bedside chair)?: A Lot Help needed to walk in hospital room?: A Lot Help needed climbing 3-5 steps with a railing? : A Lot 6 Click Score: 12    End of Session Equipment Utilized During Treatment: Gait belt Activity Tolerance: Patient tolerated treatment well Patient left: in chair;with call bell/phone within reach Nurse Communication: Mobility status PT Visit Diagnosis: Difficulty in walking, not elsewhere classified  (R26.2)     Time: 1610-9604 PT Time Calculation (min) (ACUTE ONLY): 18 min  Charges:  $Gait Training: 8-22 mins                     Mauro Kaufmann PT Acute Rehabilitation Services Pager [phone] Office [phone]    Lanelle Lindo 12/07/2017, 4:25 PM

## 2017-12-08 LAB — CBC
HCT: 27.7 % — ABNORMAL LOW (ref 36.0–46.0)
Hemoglobin: 8.7 g/dL — ABNORMAL LOW (ref 12.0–15.0)
MCH: 28.2 pg (ref 26.0–34.0)
MCHC: 31.4 g/dL (ref 30.0–36.0)
MCV: 89.9 fL (ref 80.0–100.0)
Platelets: 211 10*3/uL (ref 150–400)
RBC: 3.08 MIL/uL — ABNORMAL LOW (ref 3.87–5.11)
RDW: 14 % (ref 11.5–15.5)
WBC: 10.8 10*3/uL — ABNORMAL HIGH (ref 4.0–10.5)
nRBC: 0 % (ref 0.0–0.2)

## 2017-12-08 LAB — GLUCOSE, CAPILLARY
GLUCOSE-CAPILLARY: 184 mg/dL — AB (ref 70–99)
Glucose-Capillary: 172 mg/dL — ABNORMAL HIGH (ref 70–99)
Glucose-Capillary: 161 mg/dL — ABNORMAL HIGH (ref 70–99)
Glucose-Capillary: 166 mg/dL — ABNORMAL HIGH (ref 70–99)

## 2017-12-08 NOTE — Progress Notes (Signed)
Physical Therapy Treatment Patient Details Name: Elaine Lee MRN: [id number] DOB: 09-Nov-1945 Today's Date: 12/08/2017    History of Present Illness Pt s/p R TKR and with hx of DM, COPD, CKD, CHF, A-fib, and diabetic neuropathy    PT Comments    Pt continues cooperative but ltd this am by increased pain and fatigue   Follow Up Recommendations  Follow surgeon's recommendation for DC plan and follow-up therapies;SNF     Equipment Recommendations  None recommended by PT    Recommendations for Other Services       Precautions / Restrictions Precautions Precautions: Knee;Fall Restrictions Weight Bearing Restrictions: No Other Position/Activity Restrictions: WBAT    Mobility  Bed Mobility Overal bed mobility: Needs Assistance Bed Mobility: Supine to Sit     Supine to sit: Min assist     General bed mobility comments: cues for sequence; HOB elevated and pt utilizing bed rail  Transfers Overall transfer level: Needs assistance Equipment used: Rolling walker (2 wheeled) Transfers: Sit to/from Stand Sit to Stand: Min assist;Mod assist;From elevated surface         General transfer comment: cues for LE management and use of UEs to self assist  Ambulation/Gait Ambulation/Gait assistance: Min assist Gait Distance (Feet): 26 Feet Assistive device: Rolling walker (2 wheeled) Gait Pattern/deviations: Step-to pattern;Decreased step length - right;Decreased step length - left;Shuffle;Trunk flexed;Antalgic Gait velocity: decr   General Gait Details: cues for sequence, posture and position from [employer]             Wheelchair Mobility    Modified Rankin (Stroke Patients Only)       Balance Overall balance assessment: Needs assistance Sitting-balance support: No upper extremity supported;Feet supported Sitting balance-Leahy Scale: Good     Standing balance support: Bilateral upper extremity supported Standing balance-Leahy Scale: Poor                               Cognition Arousal/Alertness: Awake/alert Behavior During Therapy: WFL for tasks assessed/performed Overall Cognitive Status: Within Functional Limits for tasks assessed                                        Exercises Total Joint Exercises Ankle Circles/Pumps: AROM;Both;15 reps;Supine Quad Sets: AROM;Both;Supine;15 reps Heel Slides: AAROM;Right;Supine;15 reps Straight Leg Raises: AAROM;AROM;Right;Supine;15 reps Goniometric ROM: AAROM R knee -10 - 65 - pain limited    General Comments        Pertinent Vitals/Pain Pain Assessment: 0-10 Pain Score: 7  Pain Location: R knee Pain Descriptors / Indicators: Aching;Sore Pain Intervention(s): Limited activity within patient's tolerance;Monitored during session;Premedicated before session;Ice applied    Home Living                      Prior Function            PT Goals (current goals can now be found in the care plan section) Acute Rehab PT Goals Patient Stated Goal: Regain IND PT Goal Formulation: With patient Time For Goal Achievement: 12/13/17 Potential to Achieve Goals: Fair Progress towards PT goals: Not progressing toward goals - comment(increased pain and fatigue )    Frequency    7X/week      PT Plan Current plan remains appropriate;Discharge plan needs to be updated    Co-evaluation  AM-PAC PT "6 Clicks" Mobility   Outcome Measure  Help needed turning from your back to your side while in a flat bed without using bedrails?: A Little Help needed moving from lying on your back to sitting on the side of a flat bed without using bedrails?: A Lot Help needed moving to and from a bed to a chair (including a wheelchair)?: A Lot Help needed standing up from a chair using your arms (e.g., wheelchair or bedside chair)?: A Lot Help needed to walk in hospital room?: A Lot Help needed climbing 3-5 steps with a railing? : A Lot 6 Click Score:  13    End of Session Equipment Utilized During Treatment: Gait belt Activity Tolerance: Patient tolerated treatment well Patient left: in chair;with call bell/phone within reach Nurse Communication: Mobility status PT Visit Diagnosis: Difficulty in walking, not elsewhere classified (R26.2)     Time: 1610-96040921-0945 PT Time Calculation (min) (ACUTE ONLY): 24 min  Charges:  $Gait Training: 8-22 mins $Therapeutic Exercise: 8-22 mins                     Mauro KaufmannHunter Annabella Elford PT Acute Rehabilitation Services Pager [phone] Office [phone]    Daxon Kyne 12/08/2017, 1:04 PM

## 2017-12-08 NOTE — Progress Notes (Signed)
Subjective: 3 Days Post-Op Procedure(s) (LRB): COMPUTER ASSISTED TOTAL KNEE ARTHROPLASTY (Right)  Patient reports pain as mild to moderate.  Tolerating POs well.  Admits to flatus.  Denies fever, chills, N/V, CP, SOB.  Awaiting SNF placement.  Objective:   VITALS:  Temp:  [98 F (36.7 C)-98.4 F (36.9 C)] 98.4 F (36.9 C) (12/08 0451) Pulse Rate:  [47-66] 55 (12/08 0451) Resp:  [16-19] 18 (12/08 0451) BP: (113-132)/(60-70) 130/60 (12/08 0451) SpO2:  [93 %-98 %] 98 % (12/08 0820)  General: WDWN patient in NAD. Psych:  Appropriate mood and affect. Neuro:  A&O x 3, Moving all extremities, sensation intact to light touch HEENT:  EOMs intact Chest:  Even non-labored respirations Skin:  Dressing C/D/I, no rashes or lesions Extremities: warm/dry, mild edema, no erythema or echymosis.  No lymphadenopathy. Pulses: Popliteus 2+ MSK:  ROM: lacks 5 degrees of TKE, MMT: able to perform quad set, (-) Homan's    LABS Recent Labs    12/06/17 0614 12/07/17 0339 12/08/17 0439  HGB 9.3* 8.5* 8.7*  WBC 13.3* 13.8* 10.8*  PLT 201 201 211   Recent Labs    12/06/17 0614  NA 139  K 4.3  CL 106  CO2 25  BUN 17  CREATININE 0.73  GLUCOSE 210*   No results for input(s): LABPT, INR in the last 72 hours.   Assessment/Plan: 3 Days Post-Op Procedure(s) (LRB): COMPUTER ASSISTED TOTAL KNEE ARTHROPLASTY (Right)  Patient seen in rounds for Dr. Linde GillisSwinteck WBAT R LE Up with therapy D/C to SNF when bed available and appropriate paperwork completed per SW. Please call if patient ready to go today and I will place D/C order Scripts on chart Plan for outpatient post-op visit with Dr. Linna CapriceSwinteck.  Alfredo MartinezJustin Hettie Roselli PA-C [facility] Office:  [phone]

## 2017-12-08 NOTE — Progress Notes (Signed)
Physical Therapy Treatment Patient Details Name: Elaine Lee MRN: [id number] DOB: 1945-06-05 Today's Date: 12/08/2017    History of Present Illness Pt s/p R TKR and with hx of DM, COPD, CKD, CHF, A-fib, and diabetic neuropathy    PT Comments    Pt continues cooperative but ltd this date by increased pain and fatigue.     Follow Up Recommendations  Follow surgeon's recommendation for DC plan and follow-up therapies;SNF     Equipment Recommendations  None recommended by PT    Recommendations for Other Services       Precautions / Restrictions Precautions Precautions: Knee;Fall Restrictions Weight Bearing Restrictions: No Other Position/Activity Restrictions: WBAT    Mobility  Bed Mobility Overal bed mobility: Needs Assistance Bed Mobility: Sit to Supine     Supine to sit: Min assist Sit to supine: Min assist;Mod assist   General bed mobility comments: cues for sequence; cues for use of L LE to self assist.  Transfers Overall transfer level: Needs assistance Equipment used: Rolling walker (2 wheeled) Transfers: Sit to/from Stand Sit to Stand: Min assist;Mod assist;+2 safety/equipment;+2 physical assistance Stand pivot transfers: Min assist;Mod assist       General transfer comment: cues for LE management and use of UEs to self assist  Ambulation/Gait Ambulation/Gait assistance: Min assist;+2 physical assistance;+2 safety/equipment Gait Distance (Feet): 5 Feet Assistive device: Rolling walker (2 wheeled) Gait Pattern/deviations: Step-to pattern;Decreased step length - right;Decreased step length - left;Shuffle;Trunk flexed;Antalgic Gait velocity: decr   General Gait Details: cues for sequence, posture and position from RW; Pt struggling to advance either LE   Stairs             Wheelchair Mobility    Modified Rankin (Stroke Patients Only)       Balance Overall balance assessment: Needs assistance Sitting-balance support: No upper  extremity supported;Feet supported Sitting balance-Leahy Scale: Good     Standing balance support: Bilateral upper extremity supported Standing balance-Leahy Scale: Poor                              Cognition Arousal/Alertness: Awake/alert Behavior During Therapy: WFL for tasks assessed/performed Overall Cognitive Status: Within Functional Limits for tasks assessed                                        Exercises Total Joint Exercises Ankle Circles/Pumps: AROM;Both;15 reps;Supine Quad Sets: AROM;Both;Supine;15 reps Heel Slides: AAROM;Right;Supine;15 reps Straight Leg Raises: AAROM;AROM;Right;Supine;15 reps Goniometric ROM: AAROM R knee -10 - 65 - pain limited    General Comments        Pertinent Vitals/Pain Pain Assessment: 0-10 Pain Score: 7  Pain Location: R knee Pain Descriptors / Indicators: Aching;Sore Pain Intervention(s): Limited activity within patient's tolerance;Monitored during session;Premedicated before session;Ice applied    Home Living                      Prior Function            PT Goals (current goals can now be found in the care plan section) Acute Rehab PT Goals Patient Stated Goal: Regain IND PT Goal Formulation: With patient Time For Goal Achievement: 12/13/17 Potential to Achieve Goals: Fair Progress towards PT goals: Not progressing toward goals - comment    Frequency    7X/week      PT Plan Current plan  remains appropriate;Discharge plan needs to be updated    Co-evaluation              AM-PAC PT "6 Clicks" Mobility   Outcome Measure  Help needed turning from your back to your side while in a flat bed without using bedrails?: A Little Help needed moving from lying on your back to sitting on the side of a flat bed without using bedrails?: A Lot Help needed moving to and from a bed to a chair (including a wheelchair)?: A Lot Help needed standing up from a chair using your arms (e.g.,  wheelchair or bedside chair)?: A Lot Help needed to walk in hospital room?: A Lot Help needed climbing 3-5 steps with a railing? : Total 6 Click Score: 12    End of Session Equipment Utilized During Treatment: Gait belt Activity Tolerance: Patient limited by fatigue;Patient limited by pain Patient left: in bed;with call bell/phone within reach Nurse Communication: Mobility status PT Visit Diagnosis: Difficulty in walking, not elsewhere classified (R26.2)     Time: 1610-9604 PT Time Calculation (min) (ACUTE ONLY): 16 min  Charges:  $Gait Training: 8-22 mins $Therapeutic Exercise: 8-22 mins $Therapeutic Activity: 8-22 mins                     Mauro Kaufmann PT Acute Rehabilitation Services Pager [phone] Office [phone]    Alter Moss 12/08/2017, 1:09 PM

## 2017-12-09 LAB — GLUCOSE, CAPILLARY
GLUCOSE-CAPILLARY: 149 mg/dL — AB (ref 70–99)
Glucose-Capillary: 211 mg/dL — ABNORMAL HIGH (ref 70–99)
Glucose-Capillary: 159 mg/dL — ABNORMAL HIGH (ref 70–99)
Glucose-Capillary: 212 mg/dL — ABNORMAL HIGH (ref 70–99)

## 2017-12-09 NOTE — Progress Notes (Signed)
    Subjective:  Patient reports pain as moderate.  Denies N/V/CP/SOB. No c/o.  Objective:   VITALS:   Vitals:   12/08/17 1957 12/09/17 0423 12/09/17 1110 12/09/17 1323  BP: 139/72 136/71  132/87  Pulse: 60 (!) 52  70  Resp: 16 16  14   Temp: 98 F (36.7 C) 98 F (36.7 C)  98.5 F (36.9 C)  TempSrc: Oral Oral    SpO2: 95% 97% 94% 95%  Weight:      Height:        NAD ABD soft Sensation intact distally Intact pulses distally Dorsiflexion/Plantar flexion intact Incision: dressing C/D/I Compartment soft   Lab Results  Component Value Date   WBC 10.8 (H) 12/08/2017   HGB 8.7 (L) 12/08/2017   HCT 27.7 (L) 12/08/2017   MCV 89.9 12/08/2017   PLT 211 12/08/2017   BMET    Component Value Date/Time   NA 139 12/06/2017 0614   K 4.3 12/06/2017 0614   CL 106 12/06/2017 0614   CO2 25 12/06/2017 0614   GLUCOSE 210 (H) 12/06/2017 0614   BUN 17 12/06/2017 0614   CREATININE 0.73 12/06/2017 0614   CALCIUM 8.8 (L) 12/06/2017 0614   GFRNONAA >60 12/06/2017 0614   GFRAA >60 12/06/2017 [id number]     Assessment/Plan: 4 Days Post-Op   Principal Problem:   Osteoarthritis of right knee   WBAT with walker DVT ppx: Eliquis, SCDs, TEDS PO pain control PT/OT DM2: DM coordinator on board, strict glucose control Dispo: [facility] due to home situation   Jonette PesaBrian J Del Wiseman 12/09/2017, 5:33 PM   Samson FredericBrian Bryson Gavia, MD Cell: [phone] [facility] now [facility]  [place] [address], [place], [place] [address] Phone: [phone] [email or web address] Facebook  [employer]  LinkedIn  Twitter

## 2017-12-09 NOTE — Care Management Important Message (Signed)
Important Message  Patient Details  Name: Elaine Lee MRN: [id number] Date of Birth: 27-Nov-1945   Medicare Important Message Given:  Yes    Caren MacadamFuller, Markeis Allman 12/09/2017, 2:24 PMImportant Message  Patient Details  Name: Elaine Lee MRN: [id number] Date of Birth: 27-Nov-1945   Medicare Important Message Given:  Yes    Caren MacadamFuller, Talishia Betzler 12/09/2017, 2:24 PM

## 2017-12-09 NOTE — Progress Notes (Signed)
PASRR level II still pending. PASRR representative plan is to complete a face evaluation. CSW notified the patient and SNF.  CSW will continue to follow.   Elaine BarrackNicole Monicia Lee, Elaine MtLCSW, MSW Clinical Social Worker  [phone] 12/09/2017  2:40 PM

## 2017-12-09 NOTE — Progress Notes (Signed)
Physical Therapy Treatment Patient Details Name: Elaine Lee MRN: [id number] DOB: 12-14-1945 Today's Date: 12/09/2017    History of Present Illness Pt s/p R TKR and with hx of DM, COPD, CKD, CHF, A-fib, and diabetic neuropathy    PT Comments    Pt requires increased time to perform.  Pt assisted with LE exercises in supine.  Pt attempted to get OOB however pt unable to rise without significant assist (would need +2) so returned to supine.  Continue to recommend SNF upon d/c.    Follow Up Recommendations  Follow surgeon's recommendation for DC plan and follow-up therapies;SNF     Equipment Recommendations  None recommended by PT    Recommendations for Other Services       Precautions / Restrictions Precautions Precautions: Knee;Fall Restrictions Weight Bearing Restrictions: No Other Position/Activity Restrictions: WBAT    Mobility  Bed Mobility Overal bed mobility: Needs Assistance Bed Mobility: Supine to Sit;Sit to Supine     Supine to sit: Min guard Sit to supine: Mod assist   General bed mobility comments: pt self assisted L LE over EOB however requesting assist for LEs onto bed due to pain  Transfers Overall transfer level: Needs assistance Equipment used: Rolling walker (2 wheeled) Transfers: Sit to/from Stand Sit to Stand: Total assist;From elevated surface         General transfer comment: pt unable to rise, attempted 3 times, pt then requested to urinate (has been using purewick) so returned to supine  Ambulation/Gait                 Stairs             Wheelchair Mobility    Modified Rankin (Stroke Patients Only)       Balance                                            Cognition Arousal/Alertness: Awake/alert Behavior During Therapy: WFL for tasks assessed/performed Overall Cognitive Status: Within Functional Limits for tasks assessed                                         Exercises Total Joint Exercises Ankle Circles/Pumps: AROM;Both;15 reps;Supine Quad Sets: AROM;Both;Supine;15 reps Heel Slides: AAROM;Right;Supine;10 reps    General Comments        Pertinent Vitals/Pain Pain Assessment: 0-10 Pain Score: 7  Pain Location: R knee Pain Descriptors / Indicators: Aching;Sore Pain Intervention(s): Repositioned;Limited activity within patient's tolerance;Premedicated before session(declines ice)    Home Living                      Prior Function            PT Goals (current goals can now be found in the care plan section) Progress towards PT goals: Not progressing toward goals - comment(limited by pain and weakness today)    Frequency    7X/week      PT Plan Current plan remains appropriate    Co-evaluation              AM-PAC PT "6 Clicks" Mobility   Outcome Measure  Help needed turning from your back to your side while in a flat bed without using bedrails?: A Little Help needed moving from lying on your  back to sitting on the side of a flat bed without using bedrails?: A Lot Help needed moving to and from a bed to a chair (including a wheelchair)?: Total Help needed standing up from a chair using your arms (Lee.g., wheelchair or bedside chair)?: Total Help needed to walk in hospital room?: Total Help needed climbing 3-5 steps with a railing? : Total 6 Click Score: 9    End of Session Equipment Utilized During Treatment: Gait belt Activity Tolerance: Patient limited by pain Patient left: in bed;with call bell/phone within reach   PT Visit Diagnosis: Difficulty in walking, not elsewhere classified (R26.2)     Time: 0865-78461017-1043 PT Time Calculation (min) (ACUTE ONLY): 26 min  Charges:  $Therapeutic Exercise: 8-22 mins $Therapeutic Activity: 8-22 mins                     Zenovia JarredKati Magen Suriano, PT, DPT Acute Rehabilitation Services Office: [phone] Pager: [phone]  Sarajane JewsLEMYRE,Elaine Lee 12/09/2017, 12:49 PM

## 2017-12-10 LAB — GLUCOSE, CAPILLARY
Glucose-Capillary: 199 mg/dL — ABNORMAL HIGH (ref 70–99)
Glucose-Capillary: 217 mg/dL — ABNORMAL HIGH (ref 70–99)
Glucose-Capillary: 151 mg/dL — ABNORMAL HIGH (ref 70–99)
Glucose-Capillary: 226 mg/dL — ABNORMAL HIGH (ref 70–99)

## 2017-12-10 NOTE — Progress Notes (Signed)
Pt took her dressing off on her left knee; pt was asked and stated that she was having a lot of pain; wound is clean, dry, no drainage, adhesive glue intact, no separation and erythema noted; health teaching given regarding risk of  infection and verbalized understanding; PRN pain meds given with relief.

## 2017-12-10 NOTE — Progress Notes (Signed)
    Subjective:  Patient reports pain as moderate.  Denies N/V/CP/SOB. No c/o.  Objective:   VITALS:   Vitals:   12/10/17 0518 12/10/17 0852 12/10/17 1009 12/10/17 1338  BP: 120/64 128/70  (!) 119/58  Pulse: 77 71 72 (!) 58  Resp: 14  17 16   Temp: 98.3 F (36.8 C)   98.4 F (36.9 C)  TempSrc: Oral   Oral  SpO2: 100% 97% 92% 94%  Weight:      Height:        NAD ABD soft Sensation intact distally Intact pulses distally Dorsiflexion/Plantar flexion intact Incision: dressing C/D/I Compartment soft   Lab Results  Component Value Date   WBC 10.8 (H) 12/08/2017   HGB 8.7 (L) 12/08/2017   HCT 27.7 (L) 12/08/2017   MCV 89.9 12/08/2017   PLT 211 12/08/2017   BMET    Component Value Date/Time   NA 139 12/06/2017 0614   K 4.3 12/06/2017 0614   CL 106 12/06/2017 0614   CO2 25 12/06/2017 0614   GLUCOSE 210 (H) 12/06/2017 0614   BUN 17 12/06/2017 0614   CREATININE 0.73 12/06/2017 0614   CALCIUM 8.8 (L) 12/06/2017 0614   GFRNONAA >60 12/06/2017 0614   GFRAA >60 12/06/2017 [id number]     Assessment/Plan: 5 Days Post-Op   Principal Problem:   Osteoarthritis of right knee   WBAT with walker DVT ppx: Eliquis, SCDs, TEDS PO pain control PT/OT DM2: DM coordinator on board, strict glucose control Dispo: [facility]/c to [facility] due to home situation, placement pending   Elaine PesaBrian J Sherise Lee 12/10/2017, 5:41 PM   Elaine FredericBrian Calton Harshfield, MD Cell: [phone] [facility] now [facility]  [place] [address], [place], [place] [address] Phone: [phone] [email or web address] Facebook  [employer]  LinkedIn  Twitter

## 2017-12-10 NOTE — Progress Notes (Signed)
Physical Therapy Treatment Patient Details Name: Elaine Lee MRN: [id number] DOB: Aug 06, 1945 Today's Date: 12/10/2017    History of Present Illness Pt s/p R TKR and with hx of DM, COPD, CKD, CHF, A-fib, and diabetic neuropathy    PT Comments    POD # 5 pm session Called to pt's room to assist NT with transfer back to bed.  General transfer comment: required increased assist from lower level recliner chair + 2 side by side assist to rise with increased time and x 2 attempts as pt repeats "wait".  Assisted from recliner to bed with great difficulty weight shifting onto R LE to complete 1/4 turn.  Also required assist to control decend as B knee buckles during.    Follow Up Recommendations  SNF     Equipment Recommendations  None recommended by PT    Recommendations for Other Services       Precautions / Restrictions Precautions Precautions: Knee;Fall Restrictions Weight Bearing Restrictions: No Other Position/Activity Restrictions: WBAT    Mobility  Bed Mobility Overal bed mobility: Independent Bed Mobility: Sit to Supine     Supine to sit: Mod assist Sit to supine: Max assist;Total assist   General bed mobility comments: assisted back to bed   Transfers Overall transfer level: Needs assistance Equipment used: Rolling walker (2 wheeled) Transfers: Sit to/from [employer] Sit to Stand: Max assist;+2 physical assistance;+2 safety/equipment Stand pivot transfers: Max assist;+2 physical assistance;+2 safety/equipment       General transfer comment: required increased assist from lower level recliner chair + 2 side by side assist to rise with increased time and x 2 attempts as pt repeats "wait".  Assisted from recliner to bed with great difficulty weight shifting onto R LE to complete 1/4 turn.  Also required assist to control decend as B knee buckles during.    Ambulation/Gait Ambulation/Gait assistance: Min assist;Mod assist;+2 physical  assistance;+2 safety/equipment Gait Distance (Feet): 2 Feet Assistive device: Rolling walker (2 wheeled) Gait Pattern/deviations: Step-to pattern;Decreased stance time - right Gait velocity: decr   General Gait Details: transfers back to bed    Stairs             Wheelchair Mobility    Modified Rankin (Stroke Patients Only)       Balance                                            Cognition Arousal/Alertness: Awake/alert Behavior During Therapy: WFL for tasks assessed/performed Overall Cognitive Status: Within Functional Limits for tasks assessed                                        Exercises      General Comments        Pertinent Vitals/Pain Pain Assessment: 0-10 Pain Score: 7  Pain Location: R knee Pain Descriptors / Indicators: Aching;Sore Pain Intervention(s): Monitored during session;Repositioned;Ice applied    Home Living                      Prior Function            PT Goals (current goals can now be found in the care plan section) Progress towards PT goals: Progressing toward goals    Frequency    7X/week  PT Plan Current plan remains appropriate    Co-evaluation              AM-PAC PT "6 Clicks" Mobility   Outcome Measure  Help needed turning from your back to your side while in a flat bed without using bedrails?: Total Help needed moving from lying on your back to sitting on the side of a flat bed without using bedrails?: Total Help needed moving to and from a bed to a chair (including a wheelchair)?: Total Help needed standing up from a chair using your arms (e.g., wheelchair or bedside chair)?: Total Help needed to walk in hospital room?: Total Help needed climbing 3-5 steps with a railing? : Total 6 Click Score: 6    End of Session Equipment Utilized During Treatment: Gait belt Activity Tolerance: Patient limited by pain Patient left: in bed;with call bell/phone  within reach Nurse Communication: Mobility status PT Visit Diagnosis: Difficulty in walking, not elsewhere classified (R26.2)     Time: 1420-1430 PT Time Calculation (min) (ACUTE ONLY): 10 min  Charges:  $Gait Training: 8-22 mins                    Felecia ShellingLori Lucciana Head  PTA Acute  Rehabilitation Services Pager      [phone] Office      [phone]

## 2017-12-10 NOTE — Progress Notes (Signed)
PASRR still pending.   Vivi BarrackNicole Lilyana Lippman, Alexander MtLCSW, MSW Clinical Social Worker  [phone] 12/10/2017  2:35 PM

## 2017-12-10 NOTE — Progress Notes (Signed)
Physical Therapy Treatment Patient Details Name: Elaine Lee MRN: [id number] DOB: January 23, 1945 Today's Date: 12/10/2017    History of Present Illness Pt s/p R TKR and with hx of DM, COPD, CKD, CHF, A-fib, and diabetic neuropathy    PT Comments    POD # 5 am session Assisted OOB required increased time.  General bed mobility comments: assist for upper body and to complete scooting to EOB.  General Gait Details: pt required + 2 assist for safety and limited distance due to pain and fatigue.   Performed AP and knee presses only due to increased c/o pain. Pt plans to D/C to SNF for ST Rehab.    Follow Up Recommendations  SNF     Equipment Recommendations  None recommended by PT    Recommendations for Other Services       Precautions / Restrictions Precautions Precautions: Knee;Fall Restrictions Weight Bearing Restrictions: No Other Position/Activity Restrictions: WBAT    Mobility  Bed Mobility Overal bed mobility: Needs Assistance Bed Mobility: Supine to Sit     Supine to sit: Mod assist     General bed mobility comments: assist for upper body and to complete scooting to EOB.    Transfers Overall transfer level: Needs assistance Equipment used: Rolling walker (2 wheeled) Transfers: Sit to/from [employer] Sit to Stand: Max assist;Total assist;+2 physical assistance;+2 safety/equipment;From elevated surface         General transfer comment: + 2 assist from elevated bed with 50% VC's on proper hand placement.  Then stand to sit pt had uncontrolled decend "plop".  VC's for safety.   Ambulation/Gait Ambulation/Gait assistance: Min assist;Mod assist;+2 physical assistance;+2 safety/equipment Gait Distance (Feet): 2 Feet Assistive device: Rolling walker (2 wheeled) Gait Pattern/deviations: Step-to pattern;Decreased stance time - right Gait velocity: decr   General Gait Details: pt required + 2 assist for safety and limited distance due to  pain and fatigue.     Stairs             Wheelchair Mobility    Modified Rankin (Stroke Patients Only)       Balance                                            Cognition Arousal/Alertness: Awake/alert Behavior During Therapy: WFL for tasks assessed/performed Overall Cognitive Status: Within Functional Limits for tasks assessed                                        Exercises      General Comments        Pertinent Vitals/Pain Pain Assessment: 0-10 Pain Score: 8  Pain Location: R knee Pain Descriptors / Indicators: Aching;Sore Pain Intervention(s): Monitored during session;Repositioned;Premedicated before session;Ice applied    Home Living                      Prior Function            PT Goals (current goals can now be found in the care plan section) Progress towards PT goals: Progressing toward goals    Frequency    7X/week      PT Plan Current plan remains appropriate    Co-evaluation              AM-PAC  PT "6 Clicks" Mobility   Outcome Measure  Help needed turning from your back to your side while in a flat bed without using bedrails?: A Lot Help needed moving from lying on your back to sitting on the side of a flat bed without using bedrails?: A Lot Help needed moving to and from a bed to a chair (including a wheelchair)?: A Lot Help needed standing up from a chair using your arms (e.g., wheelchair or bedside chair)?: A Lot Help needed to walk in hospital room?: A Lot Help needed climbing 3-5 steps with a railing? : Total 6 Click Score: 11    End of Session Equipment Utilized During Treatment: Gait belt Activity Tolerance: Patient limited by pain Patient left: in chair;with call bell/phone within reach Nurse Communication: Mobility status PT Visit Diagnosis: Difficulty in walking, not elsewhere classified (R26.2)     Time: 1107-1130 PT Time Calculation (min) (ACUTE ONLY): 23  min  Charges:  $Gait Training: 8-22 mins $Therapeutic Activity: 8-22 mins                     Felecia ShellingLori Zyheir Daft  PTA Acute  Rehabilitation Services Pager      [phone] Office      [phone]

## 2017-12-11 LAB — GLUCOSE, CAPILLARY
GLUCOSE-CAPILLARY: 173 mg/dL — AB (ref 70–99)
Glucose-Capillary: 184 mg/dL — ABNORMAL HIGH (ref 70–99)
Glucose-Capillary: 193 mg/dL — ABNORMAL HIGH (ref 70–99)
Glucose-Capillary: 179 mg/dL — ABNORMAL HIGH (ref 70–99)

## 2017-12-11 NOTE — Progress Notes (Signed)
Physical Therapy Treatment Patient Details Name: Elaine BrickMelanie K Lee MRN: [id number] DOB: [birth year] Today's Date: 12/11/2017    History of Present Illness Pt s/p R TKR and with hx of DM, COPD, CKD, CHF, A-fib, and diabetic neuropathy    PT Comments    Right knee immobilizer appeared to assist with weight bearing on the right leg.and  Allowed improved steps. Also, stretching the knee with KI as patient lies with leg in ER and knee flexed. Plans SNF soon.   Follow Up Recommendations  SNF     Equipment Recommendations  None recommended by PT    Recommendations for Other Services       Precautions / Restrictions Precautions Precautions: Knee;Fall Precaution Comments: used KI on roight knee for stability    Mobility  Bed Mobility   Bed Mobility: Sit to Supine     Supine to sit: Mod assist Sit to supine: Max assist   General bed mobility comments: assist with legs and trunk and scooting  Transfers Overall transfer level: Needs assistance Equipment used: Rolling walker (2 wheeled) Transfers: Sit to/from Stand Sit to Stand: Max assist;+2 safety/equipment;+2 physical assistance Stand pivot transfers: Max assist;+2 physical assistance;+2 safety/equipment       General transfer comment: use of opad to assist to stand from low surface, R ki in place.  Able  to bear more weight on the right leg with KI and move left leg . patient fatigues easily.  Ambulation/Gait Ambulation/Gait assistance: Mod assist;+2 physical assistance;+2 safety/equipment Gait Distance (Feet): 5 Feet   Gait Pattern/deviations: Step-to pattern;Decreased stance time - right;Antalgic     General Gait Details: decreased weight  tlerated on right knee to step with left. chair brought up   Stairs             Wheelchair Mobility    Modified Rankin (Stroke Patients Only)       Balance                                            Cognition Arousal/Alertness:  Awake/alert                                            Exercises Total Joint Exercises Ankle Circles/Pumps: AROM;Both;15 reps;Supine Quad Sets: AROM;Both;Supine;15 reps Hip ABduction/ADduction: AAROM;Right;10 reps Straight Leg Raises: AAROM;AROM;Right;Supine;15 reps Goniometric ROM: 15-55 , really lacks extension.    General Comments        Pertinent Vitals/Pain Pain Score: 6  Pain Location: R knee Pain Descriptors / Indicators: Aching;Sore Pain Intervention(s): Monitored during session;Premedicated before session    Home Living                      Prior Function            PT Goals (current goals can now be found in the care plan section) Progress towards PT goals: Progressing toward goals    Frequency    7X/week      PT Plan Current plan remains appropriate    Co-evaluation              AM-PAC PT "6 Clicks" Mobility   Outcome Measure  Help needed turning from your back to your side while in a flat bed without using bedrails?: Total Help  needed moving from lying on your back to sitting on the side of a flat bed without using bedrails?: Total Help needed moving to and from a bed to a chair (including a wheelchair)?: Total Help needed standing up from a chair using your arms (e.g., wheelchair or bedside chair)?: Total Help needed to walk in hospital room?: Total Help needed climbing 3-5 steps with a railing? : Total 6 Click Score: 6    End of Session Equipment Utilized During Treatment: Right knee immobilizer Activity Tolerance: Patient limited by pain;Patient limited by fatigue Patient left: with call bell/phone within reach;in bed Nurse Communication: Mobility status PT Visit Diagnosis: Difficulty in walking, not elsewhere classified (R26.2)     Time: 4098-1191 PT Time Calculation (min) (ACUTE ONLY): 15 min  Charges:  $$$Therapeutic Activity: 8-22 mins                     Blanchard Kelch PT Acute Rehabilitation  Services Pager [phone] Office [phone]    Rada Hay 12/11/2017, 5:04 PM

## 2017-12-11 NOTE — Progress Notes (Signed)
    Subjective:  Patient reports pain as moderate.  Denies N/V/CP/SOB. No c/o.  Objective:   VITALS:   Vitals:   12/11/17 0532 12/11/17 0857 12/11/17 0934 12/11/17 1352  BP: 139/69  (!) 127/59 (!) 122/52  Pulse: 83  77 64  Resp: 16   16  Temp: 98.5 F (36.9 C)   98.6 F (37 C)  TempSrc: Oral   Oral  SpO2: 97% 93%  96%  Weight:      Height:        NAD ABD soft Sensation intact distally Intact pulses distally Dorsiflexion/Plantar flexion intact Incision: dressing C/D/I Compartment soft   Lab Results  Component Value Date   WBC 10.8 (H) 12/08/2017   HGB 8.7 (L) 12/08/2017   HCT 27.7 (L) 12/08/2017   MCV 89.9 12/08/2017   PLT 211 12/08/2017   BMET    Component Value Date/Time   NA 139 12/06/2017 0614   K 4.3 12/06/2017 0614   CL 106 12/06/2017 0614   CO2 25 12/06/2017 0614   GLUCOSE 210 (H) 12/06/2017 0614   BUN 17 12/06/2017 0614   CREATININE 0.73 12/06/2017 0614   CALCIUM 8.8 (L) 12/06/2017 0614   GFRNONAA >60 12/06/2017 0614   GFRAA >60 12/06/2017 [id number]     Assessment/Plan: 6 Days Post-Op   Principal Problem:   Osteoarthritis of right knee   WBAT with walker DVT ppx: Eliquis, SCDs, TEDS PO pain control PT/OT DM2: DM coordinator on board, strict glucose control Dispo: d/c to [facility] due to home situation, placement pending   Jonette PesaBrian J Bergen Melle 12/11/2017, 5:33 PM   Samson FredericBrian Kimya Mccahill, MD Cell: [phone] [facility] is now [facility]  [place] [address], [place], [place] [address] Phone: [phone] [email or web address] Facebook  [employer]  LinkedIn  Twitter

## 2017-12-11 NOTE — Progress Notes (Signed)
Patient evaluated by PASRR representative today. Representative will submit paperwork for new PASRR number. CSW will continue to assist with discharge to SNF. Nursing staff updated.   Vivi BarrackNicole Murielle Stang, Alexander MtLCSW, MSW Clinical Social Worker  [phone] 12/11/2017  4:23 PM

## 2017-12-11 NOTE — Progress Notes (Signed)
Physical Therapy Treatment Patient Details Name: Elaine Lee MRN: [id number] DOB: May 16, 1945 Today's Date: 12/11/2017    History of Present Illness Pt s/p R TKR and with hx of DM, COPD, CKD, CHF, A-fib, and diabetic neuropathy    PT Comments    The patient's right leg is always positioned in external rotation and knee is flexed. Right knne extension is lacking 20 degrees. Encouraged patient to perform internal rotation of both legs to facilitate  R knee extension and decreased ER of the leg. Patient  Relates  No caregivers who can physically assist the patient at home. Patient reports that she [facility] go to [facility] for [facility] but leaving and entering the home is an obstacle from a WC level, which patient will require. [facility] can transport patient once out of home. Continue PT. Will try a KI on the right knee for stability  To attempt increased mobility tolerance.   Follow Up Recommendations  SNF     Equipment Recommendations  None recommended by PT    Recommendations for Other Services       Precautions / Restrictions Precautions Precautions: Knee;Fall Precaution Comments: will try a KI for stability in order to progress    Mobility  Bed Mobility   Bed Mobility: Supine to Sit     Supine to sit: Mod assist     General bed mobility comments: assist with legs and trunk and scooting  Transfers Overall transfer level: Needs assistance Equipment used: Rolling walker (2 wheeled) Transfers: Sit to/from Stand Sit to Stand: Mod assist;+2 physical assistance;+2 safety/equipment;From elevated surface         General transfer comment: assist to rise, right knee supported as kquads are  sub strength to support.  Ambulation/Gait Ambulation/Gait assistance: Mod assist;+2 physical assistance;+2 safety/equipment Gait Distance (Feet): 5 Feet   Gait Pattern/deviations: Step-to pattern;Decreased stance time - right;Antalgic     General Gait Details: decreased weight  tlerated on  right knee to step with left. chair brought up   Stairs             Wheelchair Mobility    Modified Rankin (Stroke Patients Only)       Balance                                            Cognition Arousal/Alertness: Awake/alert                                            Exercises Total Joint Exercises Ankle Circles/Pumps: AROM;Both;15 reps;Supine Quad Sets: AROM;Both;Supine;15 reps Hip ABduction/ADduction: AAROM;Right;10 reps Straight Leg Raises: AAROM;AROM;Right;Supine;15 reps Goniometric ROM: 15-55 , really lacks extension.    General Comments        Pertinent Vitals/Pain Pain Score: 7  Pain Location: R knee Pain Descriptors / Indicators: Aching;Sore Pain Intervention(s): Monitored during session;Premedicated before session    Home Living                      Prior Function            PT Goals (current goals can now be found in the care plan section) Progress towards PT goals: Not progressing toward goals - comment(limited gait, )    Frequency    7X/week  PT Plan Current plan remains appropriate    Co-evaluation              AM-PAC PT "6 Clicks" Mobility   Outcome Measure  Help needed turning from your back to your side while in a flat bed without using bedrails?: Total Help needed moving from lying on your back to sitting on the side of a flat bed without using bedrails?: Total Help needed moving to and from a bed to a chair (including a wheelchair)?: Total Help needed standing up from a chair using your arms (e.g., wheelchair or bedside chair)?: Total Help needed to walk in hospital room?: Total Help needed climbing 3-5 steps with a railing? : Total 6 Click Score: 6    End of Session Equipment Utilized During Treatment: Gait belt Activity Tolerance: Patient limited by pain;Patient limited by fatigue Patient left: with call bell/phone within reach;in chair;with chair alarm  set Nurse Communication: Mobility status PT Visit Diagnosis: Difficulty in walking, not elsewhere classified (R26.2)     Time: 1610-9604 PT Time Calculation (min) (ACUTE ONLY): 25 min  Charges:  $Gait Training: 8-22 mins $Therapeutic Exercise: 8-22 mins                     Blanchard Kelch PT Acute Rehabilitation Services Pager [phone] Office [phone]    Rada Hay 12/11/2017, 3:46 PM

## 2017-12-12 LAB — GLUCOSE, CAPILLARY: Glucose-Capillary: 166 mg/dL — ABNORMAL HIGH (ref 70–99)

## 2017-12-12 NOTE — Progress Notes (Signed)
I attempted to call [facility] at 1016. I was transferred to another hall, but no one answered. The pt has been d/c through [facility].

## 2017-12-12 NOTE — Clinical Social Work Placement (Signed)
D/C Summary sent.  Nurse call report to: [phone] [facility] to transport.   CLINICAL SOCIAL WORK PLACEMENT  NOTE  Date:  12/12/2017  Patient Details  Name: Elaine Lee MRN: [id number] Date of Birth: [birth year]  Clinical Social Work is seeking post-discharge placement for this patient at the Skilled  Nursing Facility level of care (*CSW will initial, date and re-position this form in  chart as items are completed):  No   Patient/family provided with [facility] Clinical Social Work Department's list of facilities offering this level of care within the geographic area requested by the patient (or if unable, by the patient's family).  No   Patient/family informed of their freedom to choose among providers that offer the needed level of care, that participate in Medicare, Medicaid or managed care program needed by the patient, have an available bed and are willing to accept the patient.  No   Patient/family informed of [facility]'s ownership interest in [facility] and [facility], as well as of the fact that they are under no obligation to receive care at these facilities.  PASRR submitted to [employer] on 12/06/17     PASRR number received on 12/12/17     Existing PASRR number confirmed on       FL2 transmitted to all facilities in geographic area requested by pt/family on       FL2 transmitted to all facilities within larger geographic area on       Patient informed that his/her managed care company has contracts with or will negotiate with certain facilities, including the following:  [facility]     Yes   Patient/family informed of bed offers received.  Patient chooses bed at [facility]     Physician recommends and patient chooses bed at      Patient to be transferred to [facility] on 12/12/17.  Patient to be transferred to facility by [facility]     Patient family notified on 12/12/17 of transfer.  Name  of family member notified:  Patient to notify her spouse and son.      PHYSICIAN       Additional Comment:    _______________________________________________ Clearance CootsNicole A Maraya Gwilliam, LCSW 12/12/2017, 9:28 AM

## 2017-12-13 ENCOUNTER — Encounter: Payer: Self-pay | Admitting: Internal Medicine

## 2017-12-13 NOTE — Progress Notes (Signed)
Opened in error

## 2018-01-27 ENCOUNTER — Other Ambulatory Visit: Payer: Self-pay | Admitting: Nurse Practitioner

## 2018-01-27 ENCOUNTER — Ambulatory Visit
Admission: RE | Admit: 2018-01-27 | Discharge: 2018-01-27 | Disposition: A | Discharge status: Home / Self Care | Payer: Medicare (Managed Care) | Source: Ambulatory Visit | Attending: Nurse Practitioner | Admitting: Nurse Practitioner

## 2018-01-27 DIAGNOSIS — M79642 Pain in left hand: Secondary | ICD-10-CM

## 2018-03-16 IMAGING — CR DG KNEE COMPLETE 4+V*R*
4 series · 4 of 4 positions shown · non-contrast
Comparison: 09/18/2014.

CLINICAL DATA: 70-year-old who fell yesterday when her knees gave
out. Patient complains of low back pain and right knee pain. Initial
encounter.

EXAM:
RIGHT KNEE - COMPLETE 4+ VIEW

[x knee ap right (1 of 3)]
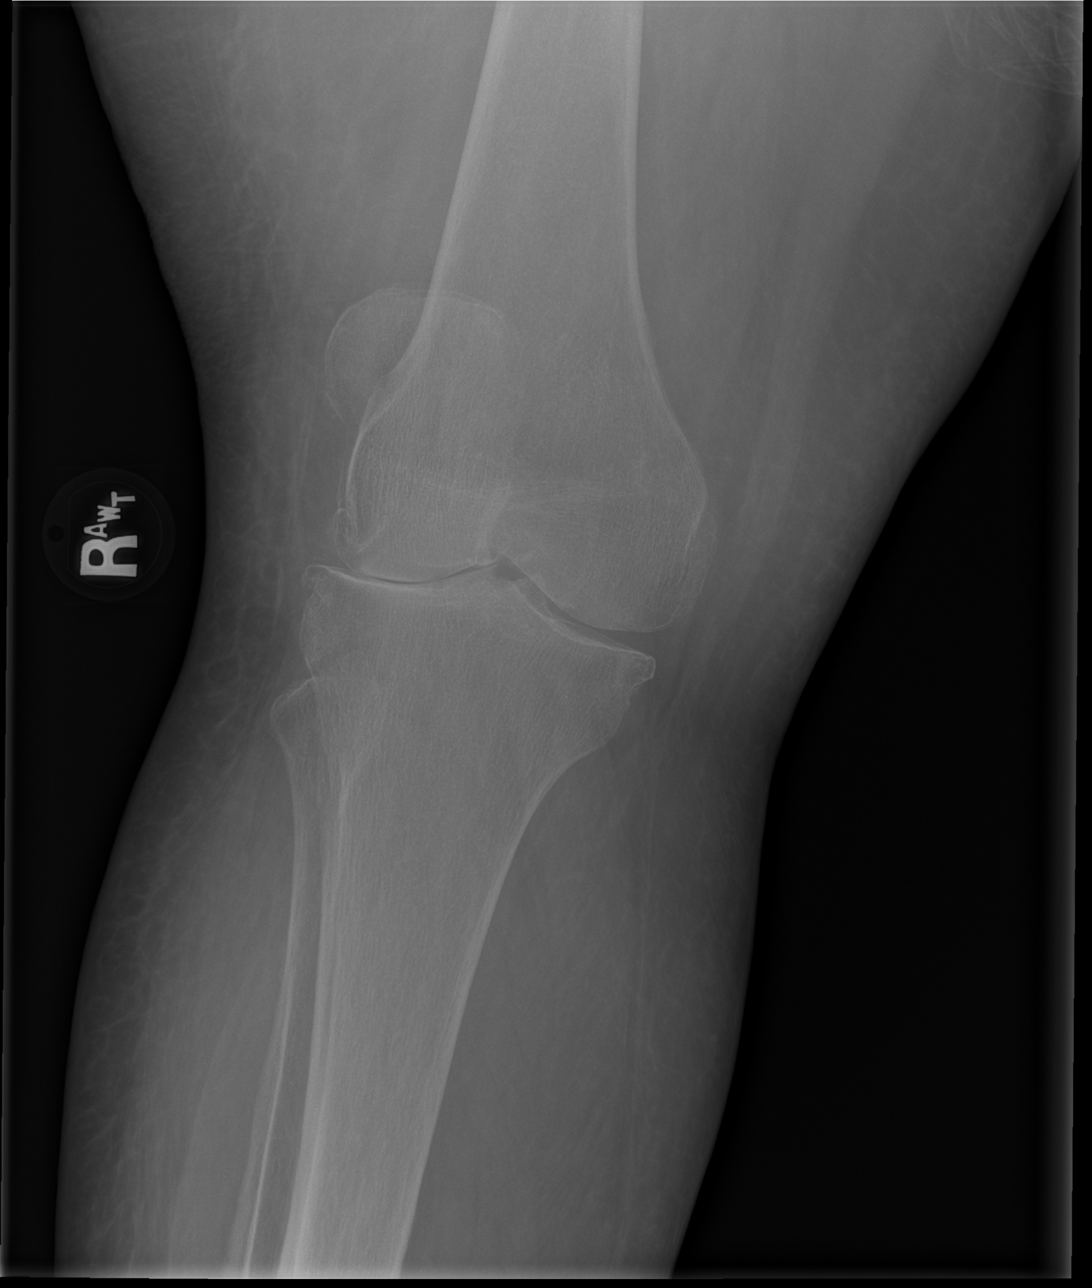

[x knee ap right (2 of 3)]
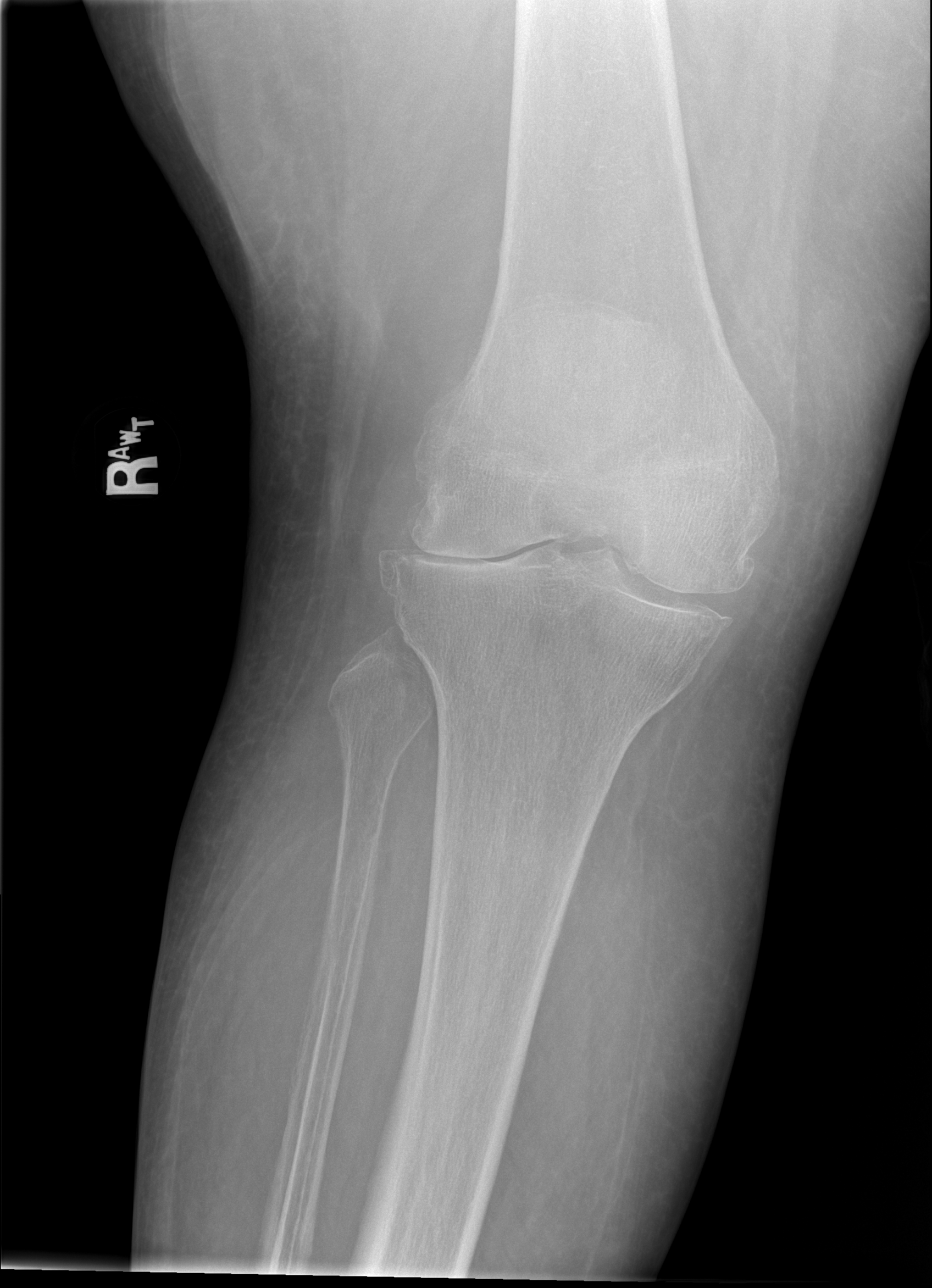

[x knee ap right (3 of 3)]
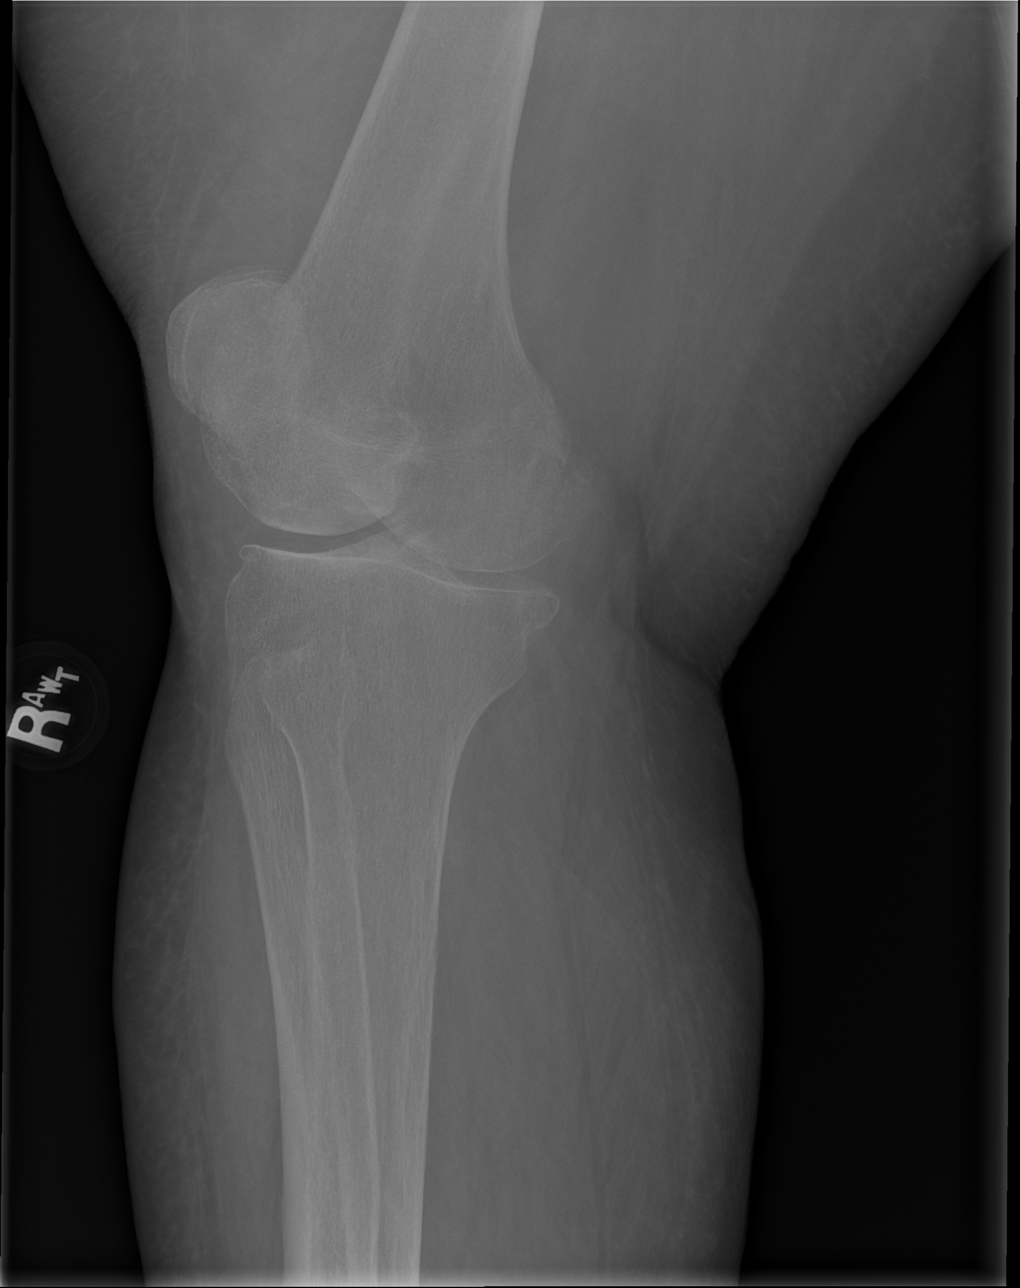

[x knee lat right]
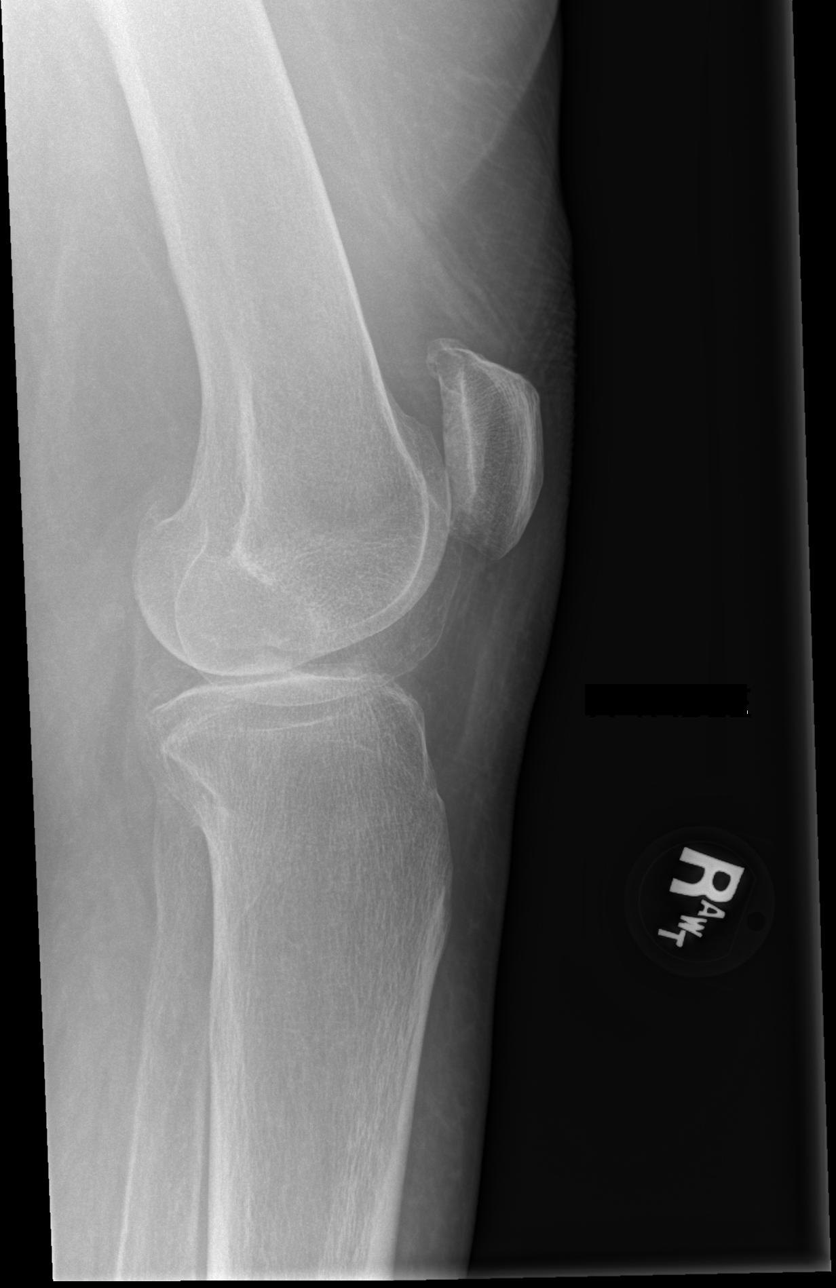

[4 of 4 positions shown; findings below may reference images not displayed]

FINDINGS: No evidence of acute fracture or dislocation. Tricompartment joint
space narrowing, severe in the lateral compartment and moderate in
the medial and patellofemoral compartments, unchanged. Generalized
osseous demineralization. No visible effusion.
IMPRESSION: 1. No acute osseous abnormality.
2. Tricompartment osteoarthritis, most severe in the lateral
compartment, stable since [DATE]. Osseous demineralization.

## 2018-03-16 IMAGING — CR DG CHEST 2V
2 series · 2 of 2 positions shown · non-contrast
Comparison: 06/03/2015.

CLINICAL DATA: Patient fell. Cough. History of asthma. Morbid
obesity.

EXAM:
CHEST  2 VIEW

[w chest lat]
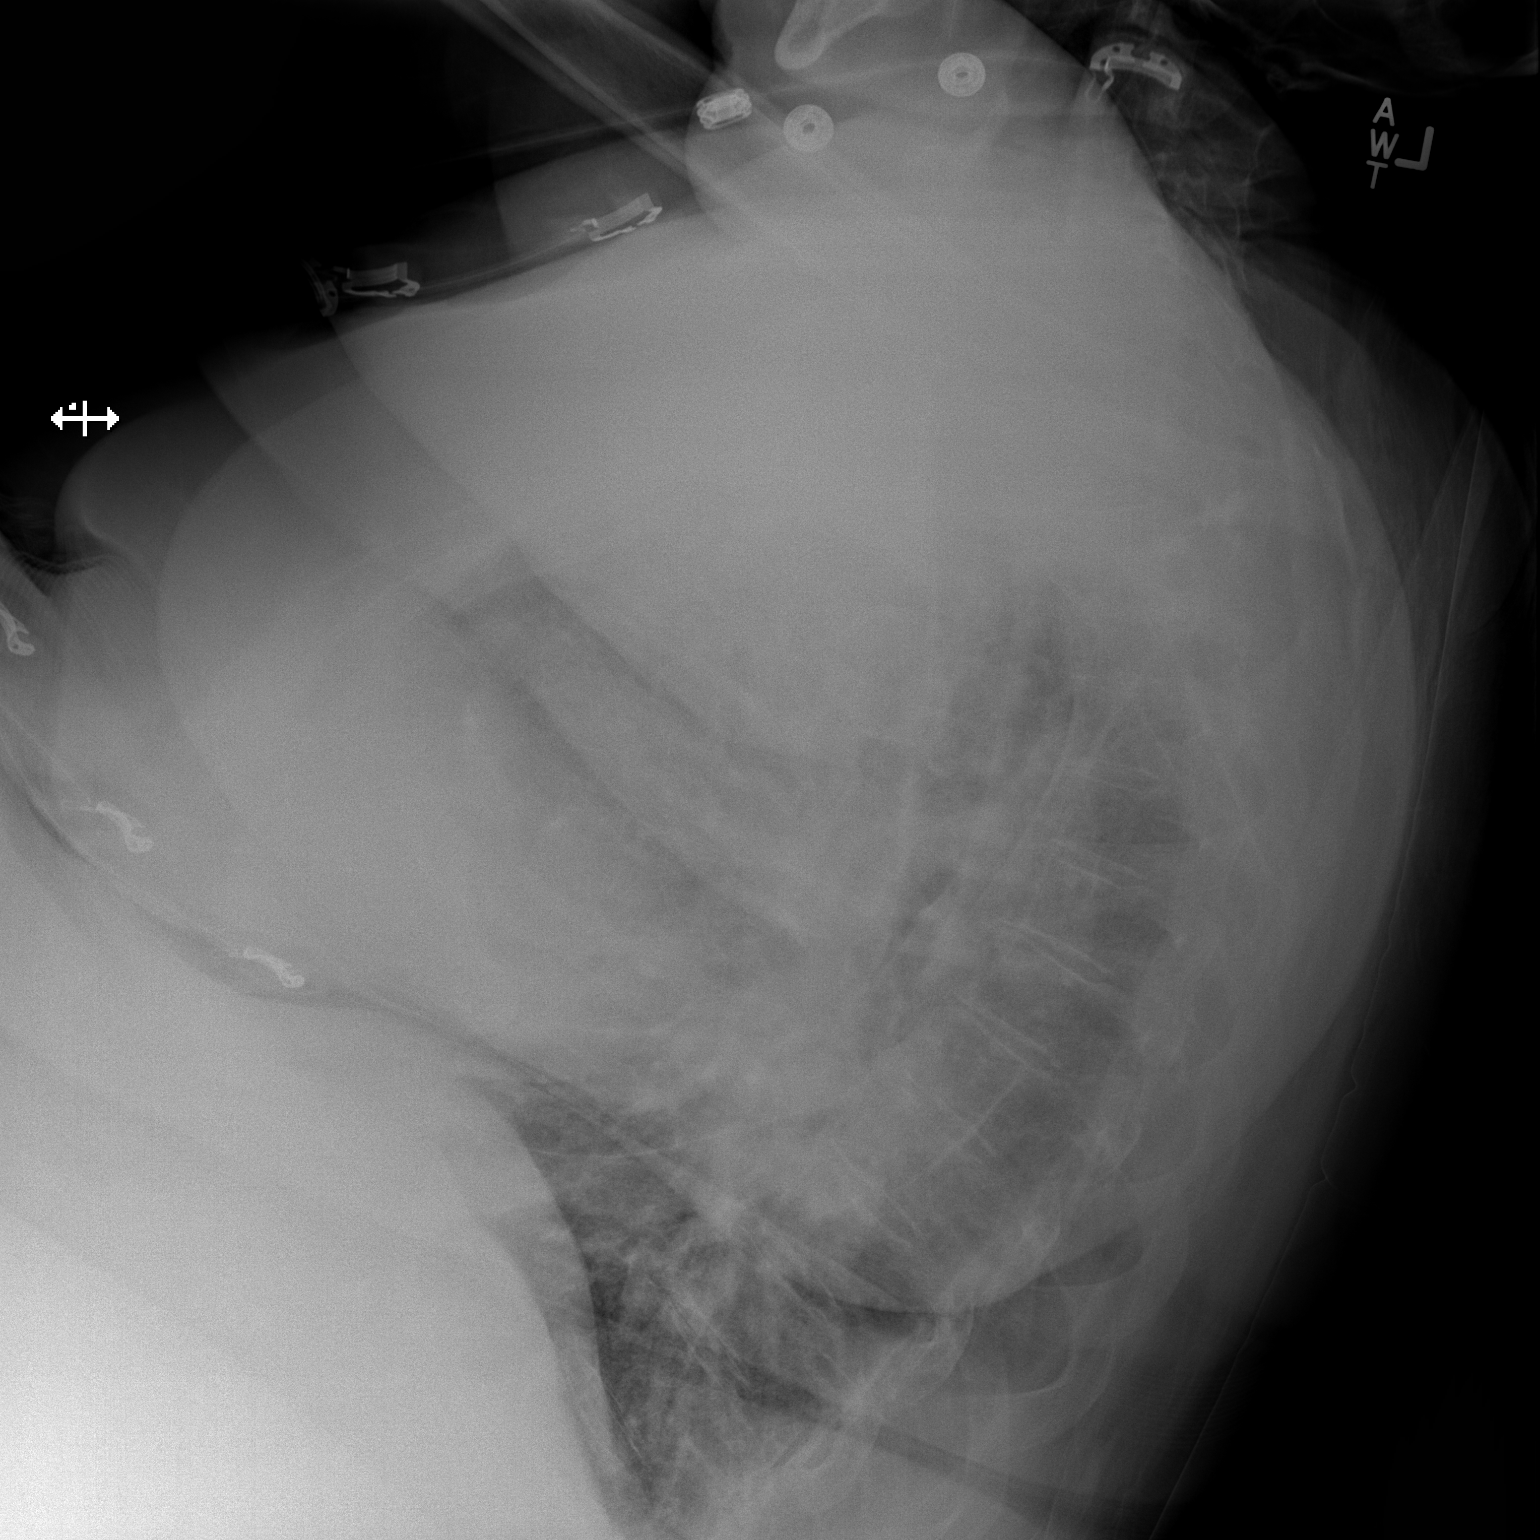

[x chest ap]
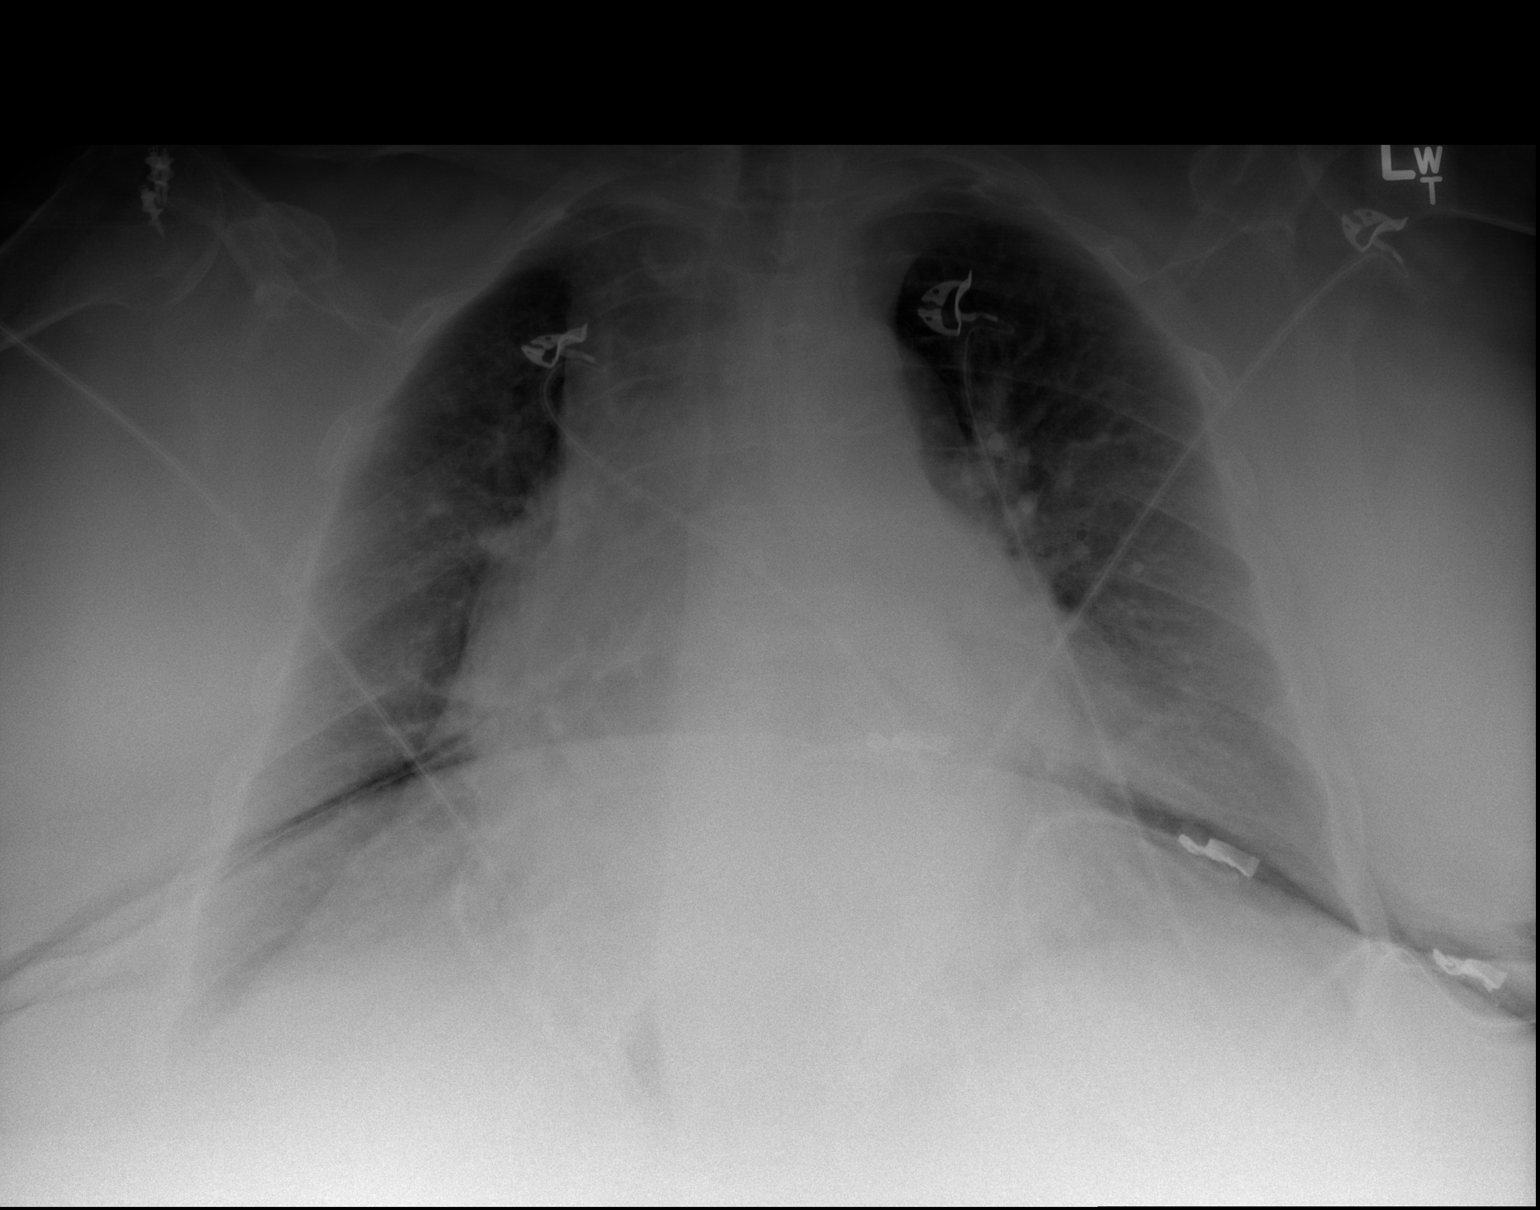

[2 of 2 positions shown; findings below may reference images not displayed]

FINDINGS: AP semierect radiograph demonstrates marked enlargement of the
cardiac silhouette, increased from priors. Mild vascular congestion
without visible pneumothorax, overt failure, or consolidation.
Increased body habitus.
IMPRESSION: Worsening aeration. Mild vascular congestion. Marked enlargement of
cardiac silhouette, increased from priors.

## 2018-10-23 ENCOUNTER — Ambulatory Visit: Payer: Self-pay | Admitting: Allergy and Immunology

## 2018-10-23 MED ORDER — DOCUSATE SODIUM 100 MG PO CAPS
100.00 | ORAL_CAPSULE | ORAL | Status: DC
Start: ? — End: 2018-10-23

## 2018-10-23 MED ORDER — LOPERAMIDE HCL 2 MG PO CAPS
2.00 | ORAL_CAPSULE | ORAL | Status: DC
Start: ? — End: 2018-10-23

## 2018-10-23 MED ORDER — MELATONIN 3 MG PO TABS
3.00 | ORAL_TABLET | ORAL | Status: DC
Start: ? — End: 2018-10-23

## 2018-10-23 MED ORDER — HYDRALAZINE HCL 20 MG/ML IJ SOLN
10.00 | INTRAMUSCULAR | Status: DC
Start: ? — End: 2018-10-23

## 2018-10-23 MED ORDER — Medication
1.00 | Status: DC
Start: 2018-10-24 — End: 2018-10-23

## 2018-10-23 MED ORDER — DILTIAZEM HCL ER COATED BEADS 120 MG PO CP24
240.00 | ORAL_CAPSULE | ORAL | Status: DC
Start: 2018-10-24 — End: 2018-10-23

## 2018-10-23 MED ORDER — DIHYDROTACHYSTEROL 0.125 MG PO TABS
10.00 | ORAL_TABLET | ORAL | Status: DC
Start: 2018-10-23 — End: 2018-10-23

## 2018-10-23 MED ORDER — GABAPENTIN 400 MG PO CAPS
1200.00 | ORAL_CAPSULE | ORAL | Status: DC
Start: 2018-10-23 — End: 2018-10-23

## 2018-10-23 MED ORDER — FUROSEMIDE 40 MG PO TABS
40.00 | ORAL_TABLET | ORAL | Status: DC
Start: 2018-10-24 — End: 2018-10-23

## 2018-10-23 MED ORDER — STRI-DEX MAXIMUM STRENGTH 2 % EX PADS
125.00 | MEDICATED_PAD | CUTANEOUS | Status: DC
Start: ? — End: 2018-10-23

## 2018-10-23 MED ORDER — INSULIN GLARGINE 100 UNIT/ML ~~LOC~~ SOLN
30.00 | SUBCUTANEOUS | Status: DC
Start: 2018-10-24 — End: 2018-10-23

## 2018-10-23 MED ORDER — FOSPHENYTOIN SODIUM 50 MG PE/ML IJ SOLN
15.00 | INTRAMUSCULAR | Status: DC
Start: ? — End: 2018-10-23

## 2018-10-23 MED ORDER — ONDANSETRON HCL 4 MG/2ML IJ SOLN
4.00 | INTRAMUSCULAR | Status: DC
Start: ? — End: 2018-10-23

## 2018-10-23 MED ORDER — WEIGHT LOSS DAILY MULTI PO TABS
5.00 | ORAL_TABLET | ORAL | Status: DC
Start: 2018-10-23 — End: 2018-10-23

## 2018-10-23 MED ORDER — CLONAZEPAM 1 MG PO TABS
2.00 | ORAL_TABLET | ORAL | Status: DC
Start: ? — End: 2018-10-23

## 2018-10-23 MED ORDER — VICON FORTE PO CAPS
17.00 | ORAL_CAPSULE | ORAL | Status: DC
Start: ? — End: 2018-10-23

## 2018-10-23 MED ORDER — MEDI-TUSSIN DM DOUBLE STRENGTH 30-200 MG/5ML PO LIQD
1000.00 | ORAL | Status: DC
Start: ? — End: 2018-10-23

## 2018-10-23 MED ORDER — GLUCAGON HCL RDNA (DIAGNOSTIC) 1 MG IJ SOLR
1.00 | INTRAMUSCULAR | Status: DC
Start: ? — End: 2018-10-23

## 2018-10-23 MED ORDER — LURASIDONE HCL 40 MG PO TABS
100.00 | ORAL_TABLET | ORAL | Status: DC
Start: ? — End: 2018-10-23

## 2018-10-23 MED ORDER — PREDNISONE 20 MG PO TABS
40.00 | ORAL_TABLET | ORAL | Status: DC
Start: 2018-10-24 — End: 2018-10-23

## 2018-10-23 MED ORDER — METHYLPHENIDATE HCL POWD
50.00 | Status: DC
Start: ? — End: 2018-10-23

## 2018-10-23 MED ORDER — INSULIN LISPRO 100 UNIT/ML ~~LOC~~ SOLN
2.00 | SUBCUTANEOUS | Status: DC
Start: 2018-10-23 — End: 2018-10-23

## 2018-10-23 MED ORDER — PCCA BIOPEPTIDE BASE EX CREA
600.00 | TOPICAL_CREAM | CUTANEOUS | Status: DC
Start: 2018-10-24 — End: 2018-10-23

## 2018-10-23 MED ORDER — Medication
500.00 | Status: DC
Start: ? — End: 2018-10-23

## 2018-10-23 MED ORDER — GENERIC EXTERNAL MEDICATION
Status: DC
Start: 2018-10-23 — End: 2018-10-23

## 2018-10-23 MED ORDER — CITALOPRAM HYDROBROMIDE 20 MG PO TABS
20.00 | ORAL_TABLET | ORAL | Status: DC
Start: 2018-10-24 — End: 2018-10-23

## 2018-10-23 MED ORDER — PANTOPRAZOLE SODIUM 40 MG PO TBEC
40.00 | DELAYED_RELEASE_TABLET | ORAL | Status: DC
Start: 2018-10-24 — End: 2018-10-23

## 2018-10-23 MED ORDER — CVS ARTHRICREAM EX
1.00 | CUTANEOUS | Status: DC
Start: 2018-10-23 — End: 2018-10-23

## 2018-11-06 ENCOUNTER — Ambulatory Visit (INDEPENDENT_AMBULATORY_CARE_PROVIDER_SITE_OTHER): Payer: Medicare (Managed Care) | Admitting: Allergy and Immunology

## 2018-11-06 ENCOUNTER — Encounter: Payer: Self-pay | Admitting: Allergy and Immunology

## 2018-11-06 ENCOUNTER — Other Ambulatory Visit: Payer: Self-pay

## 2018-11-06 VITALS — BP 132/58 | HR 78 | Temp 96.3°F | Resp 18 | Ht 64.5 in | Wt 264.8 lb

## 2018-11-06 DIAGNOSIS — J3089 Other allergic rhinitis: Secondary | ICD-10-CM | POA: Diagnosis not present

## 2018-11-06 DIAGNOSIS — J4551 Severe persistent asthma with (acute) exacerbation: Secondary | ICD-10-CM | POA: Diagnosis not present

## 2018-11-06 DIAGNOSIS — R05 Cough: Secondary | ICD-10-CM

## 2018-11-06 DIAGNOSIS — J4550 Severe persistent asthma, uncomplicated: Secondary | ICD-10-CM | POA: Insufficient documentation

## 2018-11-06 DIAGNOSIS — R053 Chronic cough: Secondary | ICD-10-CM | POA: Insufficient documentation

## 2018-11-06 MED ORDER — SPIRIVA RESPIMAT 2.5 MCG/ACT IN AERS: 2.0000 | INHALATION_SPRAY | RESPIRATORY_TRACT | 5 refills | Status: DC

## 2018-11-06 MED ORDER — AZELASTINE-FLUTICASONE 137-50 MCG/ACT NA SUSP: 1.0000 | Freq: Two times a day (BID) | NASAL | 5 refills | Status: DC | PRN

## 2018-11-06 NOTE — Assessment & Plan Note (Addendum)
The most common causes of chronic cough include the following: upper airway cough syndrome (UACS) which is caused by variety of rhinosinus conditions; asthma; gastroesophageal reflux disease (GERD); chronic bronchitis from cigarette smoking or other inhaled environmental irritants; non-asthmatic eosinophilic bronchitis; and bronchiectasis. In prospective studies, these conditions have accounted for up to 94% of the causes of chronic cough in immunocompetent adults. The history and physical examination suggest that her cough is multifactorial with contribution from bronchial hyperresponsiveness and postnasal drainage. We will address these issues at this time.   A prescription has been provided for a flutter valve to be used as needed to break the coughing cycle.  Treatment plan as outlined above.

## 2018-11-06 NOTE — Patient Instructions (Addendum)
Severe persistent asthma Currently with suboptimal control.  For now continue Symbicort 160-4.5 g, 2 inhalations twice daily, montelukast 10 mg daily at bedtime, and albuterol every 4-6 hours if needed.  To maximize pulmonary deposition, a spacer has been provided along with instructions for its proper administration with an HFA inhaler.  A prescription has been provided for Spiriva Respimat 2.5 g, 2 inhalations daily.  The following labs have been ordered to help identify phenotype and assess candidacy for a biologic agent.  These labs include: CBC with differential, serum IgE level, and alpha-1 antitrypsin level and phenotype.  Allergic rhinitis with a predominantly nonallergic component Epicutaneous skin test were negative.  Intradermal skin tests were positive to major mold mix #4.  The patient's history suggests that her nasal/sinus symptoms are predominantly non-allergic in nature.  Aeroallergen avoidance measures have been discussed and provided in written form.  A prescription has been provided for azelastine/fluticasone nasal spray, 1-2 sprays per nostril 2 times daily as needed. Proper nasal spray technique has been discussed and demonstrated.   Continue montelukast 10 mg daily at bedtime.  For thick post nasal drainage, add guaifenesin [phone] mg (Mucinex)  twice daily as needed with adequate hydration as discussed.  Cough, persistent The most common causes of chronic cough include the following: upper airway cough syndrome (UACS) which is caused by variety of rhinosinus conditions; asthma; gastroesophageal reflux disease (GERD); chronic bronchitis from cigarette smoking or other inhaled environmental irritants; non-asthmatic eosinophilic bronchitis; and bronchiectasis. In prospective studies, these conditions have accounted for up to 94% of the causes of chronic cough in immunocompetent adults. The history and physical examination suggest that her cough is multifactorial with  contribution from bronchial hyperresponsiveness and postnasal drainage. We will address these issues at this time.   A prescription has been provided for a flutter valve to be used as needed to break the coughing cycle.  Treatment plan as outlined above.     Return in about 2 months (around 01/06/2019), or if symptoms worsen or fail to improve.  Control of Mold Allergen  Mold and fungi can grow on a variety of surfaces provided certain temperature and moisture conditions exist.  Outdoor molds grow on plants, decaying vegetation and soil.  The major outdoor mold, Alternaria and Cladosporium, are found in very high numbers during hot and dry conditions.  Generally, a late Summer - Fall peak is seen for common outdoor fungal spores.  Rain will temporarily lower outdoor mold spore count, but counts rise rapidly when the rainy period ends.  The most important indoor molds are Aspergillus and Penicillium.  Dark, humid and poorly ventilated basements are ideal sites for mold growth.  The next most common sites of mold growth are the bathroom and the kitchen.  Outdoor [employer] 1. Use air conditioning and keep windows closed 2. Avoid exposure to decaying vegetation. 3. Avoid leaf raking. 4. Avoid grain handling. 5. Consider wearing a face mask if working in moldy areas.  Indoor Mold Control 1. Maintain humidity below 50%. 2. Clean washable surfaces with 5% bleach solution. 3. Remove sources e.g. Contaminated carpets.

## 2018-11-06 NOTE — Progress Notes (Signed)
New Patient Note  RE: Elaine Lee MRN: [id number] DOB: [birth year] Date of Office Visit: 11/06/2018  Referring provider: Janifer Adie, MD Primary care provider: Janifer Adie, MD  Chief Complaint: Asthma, Cough, Allergic Rhinitis , and Allergy Testing  History of present illness: Elaine Lee is a 73 y.o. female seen today in consultation requested by Elaine Drain, MD.  She reports that she has had asthma for the past 50 years.  She reports that she wheezes "24/7" and has a persistent cough which is so severe at times that she almost passes out.  Her asthma symptoms are triggered by exertion, strong aromas, hot air, and upper respiratory tract infections.  She is currently taking Symbicort 160-4.5 g, 2 inhalations twice daily, and montelukast 10 mg daily at bedtime.  Despite compliance with this regimen, she reports that she requires albuterol via the nebulizer multiple times per day.  She reports that the albuterol "calms down real quick."  She was admitted to the hospital in mid October for an asthma exacerbation.  She reports that she has been put on prednisone courses multiple times but "hates" this medication because it causes grief/depression, particularly since her husband died several months ago.  She had been followed by her pulmonologist Dr. Verdie Mosher in the past, however is now under the care of [facility].  Kaydie experiences nasal congestion, thick postnasal drainage, and occasional sinus pressure.  The symptoms occur year around and are specifically triggered by strong aromas such as scented candles and perfume.  She is currently using Nasacort AQ and montelukast in an attempt to control the symptoms.  Assessment and plan: Severe persistent asthma Currently with suboptimal control.  For now continue Symbicort 160-4.5 g, 2 inhalations twice daily, montelukast 10 mg daily at bedtime, and albuterol every 4-6 hours if needed.  To maximize pulmonary  deposition, a spacer has been provided along with instructions for its proper administration with an HFA inhaler.  A prescription has been provided for Spiriva Respimat 2.5 g, 2 inhalations daily.  The following labs have been ordered to help identify phenotype and assess candidacy for a biologic agent.  These labs include: CBC with differential, serum IgE level, and alpha-1 antitrypsin level and phenotype.  Allergic rhinitis with a predominantly nonallergic component Epicutaneous skin test were negative.  Intradermal skin tests were positive to major mold mix #4.  The patient's history suggests that her nasal/sinus symptoms are predominantly non-allergic in nature.  Aeroallergen avoidance measures have been discussed and provided in written form.  A prescription has been provided for azelastine/fluticasone nasal spray, 1-2 sprays per nostril 2 times daily as needed. Proper nasal spray technique has been discussed and demonstrated.   Continue montelukast 10 mg daily at bedtime.  For thick post nasal drainage, add guaifenesin [phone] mg (Mucinex)  twice daily as needed with adequate hydration as discussed.  Cough, persistent The most common causes of chronic cough include the following: upper airway cough syndrome (UACS) which is caused by variety of rhinosinus conditions; asthma; gastroesophageal reflux disease (GERD); chronic bronchitis from cigarette smoking or other inhaled environmental irritants; non-asthmatic eosinophilic bronchitis; and bronchiectasis. In prospective studies, these conditions have accounted for up to 94% of the causes of chronic cough in immunocompetent adults. The history and physical examination suggest that her cough is multifactorial with contribution from bronchial hyperresponsiveness and postnasal drainage. We will address these issues at this time.   A prescription has been provided for a flutter valve to be used  as needed to break the coughing cycle.  Treatment  plan as outlined above.     Meds ordered this encounter  Medications   Tiotropium Bromide Monohydrate (SPIRIVA RESPIMAT) 2.5 MCG/ACT AERS    Sig: Inhale 2 puffs into the lungs 1 day or 1 dose.    Dispense:  4 g    Refill:  5   Azelastine-Fluticasone 137-50 MCG/ACT SUSP    Sig: Place 1-2 sprays into the nose 2 (two) times daily as needed.    Dispense:  23 g    Refill:  5    Diagnostics: Spirometry: Spirometry reveals an FVC of 1.64 L (56% predicted) and an FEV1 of 1.20 L (55% predicted) without postbronchodilator improvement.  Moderate restrictive pattern without postbronchodilator response.  The restrictive pattern may be due to secondary to body habitus. Epicutaneous testing: Negative despite a positive histamine control. Intradermal testing: Positive to major mold mix #4.   Physical examination: Blood pressure (!) 132/58, pulse 78, temperature (!) 96.3 F (35.7 C), temperature source Temporal, resp. rate 18, height 5' 4.5" (1.638 m), weight 264 lb 12.8 oz (120.1 kg), SpO2 94 %.  General: Alert, interactive, in no acute distress. HEENT: TMs pearly gray, turbinates moderately edematous without discharge, post-pharynx moderately erythematous. Neck: Supple without lymphadenopathy. Lungs: Mildly decreased breath sounds with expiratory wheezing bilaterally. CV: Normal S1, S2 without murmurs. Abdomen: Nondistended, nontender. Skin: Warm and dry, without lesions or rashes. Extremities:  No clubbing, cyanosis or edema. Neuro:   Grossly intact.  Review of systems:  Review of systems negative except as noted in HPI / PMHx or noted below: Review of Systems  Constitutional: Negative.   HENT: Negative.   Eyes: Negative.   Respiratory: Negative.   Cardiovascular: Negative.   Gastrointestinal: Negative.   Genitourinary: Negative.   Musculoskeletal: Negative.   Skin: Negative.   Neurological: Negative.   Endo/Heme/Allergies: Negative.   Psychiatric/Behavioral: Negative.      Past medical history:  Past Medical History:  Diagnosis Date   A-fib (HCC)    Asthma    CHF (congestive heart failure) (HCC)    Chronic edema    CKD (chronic kidney disease), stage III    COPD (chronic obstructive pulmonary disease) (HCC)    Degenerative lumbar disc    Depression    Diabetes mellitus without complication (HCC)    Diabetic neuropathy (HCC)    GERD (gastroesophageal reflux disease)    Gout    Hearing loss    Hypercholesteremia    Hypertension    Insomnia    Obstructive sleep apnea on CPAP    Osteoarthritis    knees and shoulders   Renal disorder    Urinary frequency     Past surgical history:  Past Surgical History:  Procedure Laterality Date   ABDOMINAL HYSTERECTOMY     KNEE ARTHROPLASTY Right 12/05/2017   Procedure: COMPUTER ASSISTED TOTAL KNEE ARTHROPLASTY;  Surgeon: Samson Frederic, MD;  Location: [facility];  Service: Orthopedics;  Laterality: Right;   ROTATOR CUFF REPAIR  1980   x3   TONSILLECTOMY      Family history: Family History  Problem Relation Age of Onset   Allergies Mother    Rheumatologic disease Mother    Cancer Mother    Allergies Father    Asthma Father    Heart disease Father    Rheumatologic disease Father    Cancer Father    Allergies Daughter    Cancer Sister    Breast cancer Maternal Aunt    Breast cancer  Paternal Aunt    Breast cancer Maternal Aunt    Allergic rhinitis Neg Hx    Eczema Neg Hx    Urticaria Neg Hx     Social history: Social History   Socioeconomic History   Marital status: Married    Spouse name: Not on file   Number of children: Not on file   Years of education: Not on file   Highest education level: Not on file  Occupational History   Not on file  Social Needs   Financial resource strain: Not on file   Food insecurity    Worry: Not on file    Inability: Not on file   Transportation needs    Medical: Not on file    Non-medical: Not on  file  Tobacco Use   Smoking status: Never Smoker   Smokeless tobacco: Never Used  Substance and Sexual Activity   Alcohol use: No    Alcohol/week: 0.0 standard drinks   Drug use: Never   Sexual activity: Not on file  Lifestyle   Physical activity    Days per week: Not on file    Minutes per session: Not on file   Stress: Not on file  Relationships   Social connections    Talks on phone: Not on file    Gets together: Not on file    Attends religious service: Not on file    Active member of club or organization: Not on file    Attends meetings of clubs or organizations: Not on file    Relationship status: Not on file   Intimate partner violence    Fear of current or ex partner: Not on file    Emotionally abused: Not on file    Physically abused: Not on file    Forced sexual activity: Not on file  Other Topics Concern   Not on file  Social History Narrative   Not on file    Environmental History: The patient lives in a 75 year old house with laminate floors throughout, gas heat, and central air.  There is a dog and cat in the home, the dog has access to her bedroom.  There is mold/water damage in the home.  She is a non-smoker.  Current Outpatient Medications  Medication Sig Dispense Refill   albuterol (ACCUNEB) 0.63 MG/3ML nebulizer solution Take 1 ampule by nebulization 3 (three) times daily as needed for wheezing or shortness of breath.     apixaban (ELIQUIS) 5 MG TABS tablet Take 5 mg by mouth 2 (two) times daily.     atorvastatin (LIPITOR) 10 MG tablet Take 10 mg by mouth at bedtime.      budesonide-formoterol (SYMBICORT) 160-4.5 MCG/ACT inhaler Inhale 2 puffs into the lungs 2 (two) times daily.     citalopram (CELEXA) 20 MG tablet Take 20 mg by mouth daily.     diltiazem (CARDIZEM CD) 240 MG 24 hr capsule Take 240 mg by mouth daily.     docusate sodium (COLACE) 100 MG capsule Take 1 capsule (100 mg total) by mouth 2 (two) times daily. 60 capsule 1    famotidine (PEPCID) 20 MG tablet Take 20 mg by mouth 2 (two) times daily.     fesoterodine (TOVIAZ) 4 MG TB24 tablet Take 4 mg by mouth daily.      fluticasone (FLONASE) 50 MCG/ACT nasal spray Place 1 spray into both nostrils daily.     furosemide (LASIX) 40 MG tablet Take 40 mg by mouth daily.  gabapentin (NEURONTIN) 600 MG tablet Take 1 tablet (600 mg total) by mouth 2 (two) times daily. (Patient taking differently: Take 600-1,200 mg by mouth See admin instructions. Take 600 mg by mouth in the morning and take 1200 mg by mouth at bedtime) 60 tablet 2   guaiFENesin (MUCINEX) 600 MG 12 hr tablet Take 600 mg by mouth 2 (two) times daily.     Insulin Glargine (BASAGLAR KWIKPEN) 100 UNIT/ML SOPN Inject 6 Units into the skin daily.     insulin glargine (LANTUS) 100 UNIT/ML injection Inject 0.28 mLs (28 Units total) into the skin daily. 10 mL 11   ipratropium-albuterol (DUONEB) 0.5-2.5 (3) MG/3ML SOLN Take 3 mLs by nebulization 3 (three) times daily as needed (For wheezing).     liraglutide (VICTOZA) 18 MG/3ML SOPN Inject 1.2 mg into the skin daily.      loratadine (CLARITIN) 10 MG tablet Take 10 mg by mouth every morning.     losartan (COZAAR) 100 MG tablet Take 100 mg by mouth daily.     Menthol, Topical Analgesic, (BIOFREEZE) 4 % GEL Apply topically. Apply topically as needed to painful joints     montelukast (SINGULAIR) 10 MG tablet Take 1 tablet (10 mg total) by mouth at bedtime. 30 tablet 5   nystatin (NYSTATIN) powder Apply 1 g topically 4 (four) times daily as needed (for skin folds).      ondansetron (ZOFRAN) 4 MG tablet Take 1 tablet (4 mg total) by mouth every 6 (six) hours as needed for nausea. 20 tablet 0   oxyCODONE (OXY IR/ROXICODONE) 5 MG immediate release tablet Take 5 mg by mouth. Give one tablet by mouth every( 8) eight hours     polyethylene glycol (MIRALAX / GLYCOLAX) packet Take 17 g by mouth daily. (Patient taking differently: Take 17 g by mouth daily as needed  for moderate constipation. ) 14 each 0   ranitidine (ZANTAC) 150 MG tablet Take 150 mg by mouth 2 (two) times daily.     rOPINIRole (REQUIP) 0.5 MG tablet Take 1 mg by mouth at bedtime.     senna (SENOKOT) 8.6 MG TABS tablet Take 2 tablets (17.2 mg total) by mouth at bedtime. 120 each 0   simethicone (MYLICON) 125 MG chewable tablet Chew 125 mg by mouth every 6 (six) hours as needed for flatulence.     tetrahydrozoline (VISINE) 0.05 % ophthalmic solution Place 1 drop into both eyes 4 (four) times daily as needed (for eye irritation).      Azelastine-Fluticasone 137-50 MCG/ACT SUSP Place 1-2 sprays into the nose 2 (two) times daily as needed. 23 g 5   Tiotropium Bromide Monohydrate (SPIRIVA RESPIMAT) 2.5 MCG/ACT AERS Inhale 2 puffs into the lungs 1 day or 1 dose. 4 g 5   No current facility-administered medications for this visit.     Known medication allergies: Allergies  Allergen Reactions   Other Shortness Of Breath and Other (See Comments)    ROSEMARY AND HEAVY SCENTED PERFUMES, DETERGENTS, ETC   Tape Itching, Rash and Other (See Comments)    Burns and blisters USE PAPER TAPE   Trazodone And Nefazodone Shortness Of Breath and Swelling   Celebrex [Celecoxib] Other (See Comments)    HARD TIME HEARING, LIKE BEING UNDERWATER   Latex Hives, Itching and Rash   Mysoline [Primidone] Other (See Comments)    unknown   Niacin And Related Hives and Swelling   Sulfa Antibiotics Other (See Comments)    TONGUE BLISTERS     Aspirin Other (  See Comments)    GI PROBLEMS AND PAIN    I appreciate the opportunity to take part in Corra's care. Please do not hesitate to contact me with questions.  Sincerely,   R. Jorene Guest, MD

## 2018-11-06 NOTE — Assessment & Plan Note (Addendum)
Currently with suboptimal control.  For now continue Symbicort 160-4.5 g, 2 inhalations twice daily, montelukast 10 mg daily at bedtime, and albuterol every 4-6 hours if needed.  To maximize pulmonary deposition, a spacer has been provided along with instructions for its proper administration with an HFA inhaler.  A prescription has been provided for Spiriva Respimat 2.5 g, 2 inhalations daily.  The following labs have been ordered to help identify phenotype and assess candidacy for a biologic agent.  These labs include: CBC with differential, serum IgE level, and alpha-1 antitrypsin level and phenotype.

## 2018-11-06 NOTE — Assessment & Plan Note (Signed)
Epicutaneous skin test were negative.  Intradermal skin tests were positive to major mold mix #4.  The patient's history suggests that her nasal/sinus symptoms are predominantly non-allergic in nature.  Aeroallergen avoidance measures have been discussed and provided in written form.  A prescription has been provided for azelastine/fluticasone nasal spray, 1-2 sprays per nostril 2 times daily as needed. Proper nasal spray technique has been discussed and demonstrated.   Continue montelukast 10 mg daily at bedtime.  For thick post nasal drainage, add guaifenesin [phone] mg (Mucinex)  twice daily as needed with adequate hydration as discussed.

## 2018-11-13 LAB — ALPHA-1 ANTITRYPSIN PHENOTYPE: A-1 Antitrypsin: 146 mg/dL (ref 101–187)

## 2018-11-13 LAB — ALLERGENS W/TOTAL IGE AREA 2

## 2018-11-13 LAB — CBC WITH DIFFERENTIAL/PLATELET
Basophils Absolute: 0 10*3/uL (ref 0.0–0.2)
Basos: 0 %
EOS (ABSOLUTE): 0.5 10*3/uL — ABNORMAL HIGH (ref 0.0–0.4)
Eos: 5 %
Hematocrit: 33.7 % — ABNORMAL LOW (ref 34.0–46.6)
Hemoglobin: 11.2 g/dL (ref 11.1–15.9)
Immature Grans (Abs): 0 10*3/uL (ref 0.0–0.1)
Immature Granulocytes: 0 %
Lymphocytes Absolute: 1.8 10*3/uL (ref 0.7–3.1)
Lymphs: 19 %
MCH: 28.6 pg (ref 26.6–33.0)
MCHC: 33.2 g/dL (ref 31.5–35.7)
MCV: 86 fL (ref 79–97)
Monocytes Absolute: 0.8 10*3/uL (ref 0.1–0.9)
Monocytes: 8 %
Neutrophils Absolute: 6.2 10*3/uL (ref 1.4–7.0)
Neutrophils: 68 %
Platelets: 209 10*3/uL (ref 150–450)
RBC: 3.92 x10E6/uL (ref 3.77–5.28)
RDW: 13.1 % (ref 11.7–15.4)
WBC: 9.2 10*3/uL (ref 3.4–10.8)

## 2018-11-13 LAB — ALPHA-GAL PANEL
Alpha Gal IgE*: <0.1 kU/L (ref <0.10)
Beef (Bos spp) IgE: <0.1 kU/L (ref <0.35)
Class Interpretation: 0
Class Interpretation: 0
Class Interpretation: 0
Lamb/Mutton (Ovis spp) IgE: <0.1 kU/L (ref <0.35)
Pork (Sus spp) IgE: <0.1 kU/L (ref <0.35)

## 2018-11-16 MED ORDER — CETIRIZINE HCL 10 MG PO TABS
10.00 | ORAL_TABLET | ORAL | Status: DC
Start: 2018-11-17 — End: 2018-11-16

## 2018-11-16 MED ORDER — GABAPENTIN 400 MG PO CAPS
1200.00 | ORAL_CAPSULE | ORAL | Status: DC
Start: 2018-11-16 — End: 2018-11-16

## 2018-11-16 MED ORDER — GUAIFENESIN ER 600 MG PO TB12
600.00 | ORAL_TABLET | ORAL | Status: DC
Start: 2018-11-16 — End: 2018-11-16

## 2018-11-16 MED ORDER — CITALOPRAM HYDROBROMIDE 20 MG PO TABS
20.00 | ORAL_TABLET | ORAL | Status: DC
Start: 2018-11-17 — End: 2018-11-16

## 2018-11-16 MED ORDER — DOCUSATE SODIUM 100 MG PO CAPS
100.00 | ORAL_CAPSULE | ORAL | Status: DC
Start: ? — End: 2018-11-16

## 2018-11-16 MED ORDER — NYSTATIN 100000 UNIT/GM EX POWD
CUTANEOUS | Status: DC
Start: 2018-11-16 — End: 2018-11-16

## 2018-11-16 MED ORDER — ATORVASTATIN CALCIUM 10 MG PO TABS
10.00 | ORAL_TABLET | ORAL | Status: DC
Start: 2018-11-17 — End: 2018-11-16

## 2018-11-16 MED ORDER — OXYBUTYNIN CHLORIDE 5 MG PO TABS
5.00 | ORAL_TABLET | ORAL | Status: DC
Start: 2018-11-16 — End: 2018-11-16

## 2018-11-16 MED ORDER — TRAMADOL HCL 50 MG PO TABS
50.00 | ORAL_TABLET | ORAL | Status: DC
Start: 2018-11-16 — End: 2018-11-16

## 2018-11-16 MED ORDER — ACETAMINOPHEN 325 MG PO TABS
650.00 | ORAL_TABLET | ORAL | Status: DC
Start: ? — End: 2018-11-16

## 2018-11-16 MED ORDER — CLONIDINE HCL 0.1 MG PO TABS
0.10 | ORAL_TABLET | ORAL | Status: DC
Start: ? — End: 2018-11-16

## 2018-11-16 MED ORDER — GLUCAGON HCL RDNA (DIAGNOSTIC) 1 MG IJ SOLR
1.00 | INTRAMUSCULAR | Status: DC
Start: ? — End: 2018-11-16

## 2018-11-16 MED ORDER — ONDANSETRON 4 MG PO TBDP
4.00 | ORAL_TABLET | ORAL | Status: DC
Start: ? — End: 2018-11-16

## 2018-11-16 MED ORDER — ALBUTEROL SULFATE (2.5 MG/3ML) 0.083% IN NEBU
2.50 | INHALATION_SOLUTION | RESPIRATORY_TRACT | Status: DC
Start: ? — End: 2018-11-16

## 2018-11-16 MED ORDER — ACETAMINOPHEN 325 MG PO TABS
325.00 | ORAL_TABLET | ORAL | Status: DC
Start: ? — End: 2018-11-16

## 2018-11-16 MED ORDER — GLUCOSE 40 % PO GEL
15.00 | ORAL | Status: DC
Start: ? — End: 2018-11-16

## 2018-11-16 MED ORDER — HYDROCODONE-HOMATROPINE 5-1.5 MG/5ML PO SYRP
5.00 | ORAL_SOLUTION | ORAL | Status: DC
Start: ? — End: 2018-11-16

## 2018-11-16 MED ORDER — GUAIFENESIN 100 MG/5ML PO LIQD
200.00 | ORAL | Status: DC
Start: ? — End: 2018-11-16

## 2018-11-16 MED ORDER — POLYETHYLENE GLYCOL 3350 17 G PO PACK
17.00 | PACK | ORAL | Status: DC
Start: ? — End: 2018-11-16

## 2018-11-16 MED ORDER — FAMOTIDINE 20 MG PO TABS
20.00 | ORAL_TABLET | ORAL | Status: DC
Start: 2018-11-16 — End: 2018-11-16

## 2018-11-16 MED ORDER — BISACODYL 5 MG PO TBEC
10.00 | DELAYED_RELEASE_TABLET | ORAL | Status: DC
Start: ? — End: 2018-11-16

## 2018-11-16 MED ORDER — BENZONATATE 100 MG PO CAPS
100.00 | ORAL_CAPSULE | ORAL | Status: DC
Start: ? — End: 2018-11-16

## 2018-11-16 MED ORDER — DILTIAZEM HCL ER COATED BEADS 120 MG PO CP24
240.00 | ORAL_CAPSULE | ORAL | Status: DC
Start: 2018-11-17 — End: 2018-11-16

## 2018-11-16 MED ORDER — CHOLECALCIFEROL 25 MCG (1000 UT) PO TABS
1000.00 | ORAL_TABLET | ORAL | Status: DC
Start: 2018-11-17 — End: 2018-11-16

## 2018-11-16 MED ORDER — GABAPENTIN 300 MG PO CAPS
600.00 | ORAL_CAPSULE | ORAL | Status: DC
Start: 2018-11-17 — End: 2018-11-16

## 2018-11-16 MED ORDER — MELATONIN 3 MG PO TABS
3.00 | ORAL_TABLET | ORAL | Status: DC
Start: ? — End: 2018-11-16

## 2018-11-16 MED ORDER — DEXTROSE 10 % IV SOLN
125.00 | INTRAVENOUS | Status: DC
Start: ? — End: 2018-11-16

## 2018-11-16 MED ORDER — CEFDINIR 300 MG PO CAPS
300.00 | ORAL_CAPSULE | ORAL | Status: DC
Start: 2018-11-16 — End: 2018-11-16

## 2018-11-16 MED ORDER — INSULIN LISPRO 100 UNIT/ML ~~LOC~~ SOLN
2.00 | SUBCUTANEOUS | Status: DC
Start: 2018-11-16 — End: 2018-11-16

## 2018-11-16 MED ORDER — PREDNISONE 20 MG PO TABS
40.00 | ORAL_TABLET | ORAL | Status: DC
Start: 2018-11-16 — End: 2018-11-16

## 2018-11-16 MED ORDER — FLUTICASONE FUROATE-VILANTEROL 100-25 MCG/INH IN AEPB
1.00 | INHALATION_SPRAY | RESPIRATORY_TRACT | Status: DC
Start: 2018-11-17 — End: 2018-11-16

## 2018-11-16 MED ORDER — INSULIN GLARGINE 100 UNIT/ML ~~LOC~~ SOLN
35.00 | SUBCUTANEOUS | Status: DC
Start: 2018-11-16 — End: 2018-11-16

## 2018-11-16 MED ORDER — APIXABAN 5 MG PO TABS
5.00 | ORAL_TABLET | ORAL | Status: DC
Start: 2018-11-16 — End: 2018-11-16

## 2018-11-16 MED ORDER — MONTELUKAST SODIUM 10 MG PO TABS
10.00 | ORAL_TABLET | ORAL | Status: DC
Start: 2018-11-17 — End: 2018-11-16

## 2018-11-16 MED ORDER — FLUTICASONE PROPIONATE 50 MCG/ACT NA SUSP
2.00 | NASAL | Status: DC
Start: 2018-11-16 — End: 2018-11-16

## 2018-11-16 MED ORDER — FUROSEMIDE 40 MG PO TABS
40.00 | ORAL_TABLET | ORAL | Status: DC
Start: 2018-11-16 — End: 2018-11-16

## 2018-11-16 MED ORDER — GENERIC EXTERNAL MEDICATION
Status: DC
Start: 2018-11-16 — End: 2018-11-16

## 2018-11-16 MED ORDER — LOSARTAN POTASSIUM 50 MG PO TABS
100.00 | ORAL_TABLET | ORAL | Status: DC
Start: 2018-11-17 — End: 2018-11-16

## 2018-11-16 MED ORDER — ROPINIROLE HCL 1 MG PO TABS
1.00 | ORAL_TABLET | ORAL | Status: DC
Start: 2018-11-16 — End: 2018-11-16

## 2018-11-20 ENCOUNTER — Telehealth: Payer: Self-pay

## 2018-11-20 NOTE — Telephone Encounter (Signed)
Lab results printed and mail to patient. Patient will need to call the office to discuss lab results

## 2018-12-02 ENCOUNTER — Other Ambulatory Visit: Payer: Self-pay

## 2018-12-02 ENCOUNTER — Ambulatory Visit (INDEPENDENT_AMBULATORY_CARE_PROVIDER_SITE_OTHER): Payer: Medicare (Managed Care)

## 2018-12-02 ENCOUNTER — Ambulatory Visit (INDEPENDENT_AMBULATORY_CARE_PROVIDER_SITE_OTHER): Payer: Medicare (Managed Care) | Admitting: Critical Care Medicine

## 2018-12-02 ENCOUNTER — Encounter: Payer: Self-pay | Admitting: Critical Care Medicine

## 2018-12-02 ENCOUNTER — Ambulatory Visit: Payer: Medicare (Managed Care) | Admitting: Adult Health

## 2018-12-02 VITALS — BP 150/70 | HR 78 | Temp 97.2°F | Ht 64.57 in | Wt 268.8 lb

## 2018-12-02 DIAGNOSIS — R9389 Abnormal findings on diagnostic imaging of other specified body structures: Secondary | ICD-10-CM

## 2018-12-02 DIAGNOSIS — J3089 Other allergic rhinitis: Secondary | ICD-10-CM

## 2018-12-02 DIAGNOSIS — Z23 Encounter for immunization: Secondary | ICD-10-CM | POA: Diagnosis not present

## 2018-12-02 DIAGNOSIS — J4550 Severe persistent asthma, uncomplicated: Secondary | ICD-10-CM

## 2018-12-02 DIAGNOSIS — R0602 Shortness of breath: Secondary | ICD-10-CM

## 2018-12-02 MED ORDER — PANTOPRAZOLE SODIUM 40 MG PO TBEC: 40.0000 mg | DELAYED_RELEASE_TABLET | Freq: Every day | ORAL | 11 refills | Status: DC

## 2018-12-02 NOTE — Progress Notes (Signed)
Synopsis: Referred in September 2016 for asthma, OHS, OSA by Jethro Bastos, MD.  Previously patient of Dr. Craige Cotta.  Subjective:   PATIENT ID: Elaine Lee GENDER: female DOB: 09-03-1945, MRN: [id number]  Chief Complaint  Patient presents with   Consult    Patient is here to establish care asthma, shortness of breath and frequent hospitalizations. Patient used to use O2 all the time 2 years ago.    Elaine Lee is a 73 year old woman with a history of allergies, asthma and OSA on CPAP.  She formally had OHS requiring home oxygen, but has not needed for the past 2 years after losing weight.  Her asthma has been worse recently.  She has been hospitalized 2 times in the past few months at [facility].  She has been following up with her allergist Dr. Nunzio Cobbs, who is planning on starting her on an anti-IL-5 biologic in the future.  He added Spiriva to her regimen at her last visit, which she thinks is helping.  She remains on Advair 250-50, montelukast, Claritin, and Flonase nasal spray.  She has been using her nebulizer less in the last few weeks since recovering from her exacerbation-twice per day in the morning and before bed.  Due to nocturnal symptoms she has not been using her CPAP at night.  Her symptoms are exacerbated by strong smells, and when her allergies are worse, commonly this time of year.  Wheezing is her main complaint.  Her nonproductive cough has caused her to have rib pain, but overall her cough is improving since hospitalization.  She has 1 pet dog, but denies a history of allergies to dogs.  She is concerned that she has mold in a closet from a previous leak in her roof that she has not been able to have fixed yet.  Breathing is a contributor to her activity limitation, but she also attributes hip and back pain.   She has a history of chronic GERD that is uncontrolled.  She is taking Pepcid twice daily and has never been on a PPI.  She has frequent belching and throat  clearing.  She avoids spicy foods, eating mostly bland foods, which helps.  She does not drink caffeine, but frequently drinks carbonated beverages and eats chocolate.  She has struggled more with her depression this year.  [facility], but has been unable to go since it has been closed due to COVID-19.  Her husband passed away suddenly in the spring.  She now lives with her son, but has stress due to his history of drug use.  While she was recently hospitalized she had a CT scan which demonstrated a pleural effusion.       Past Medical History:  Diagnosis Date   A-fib (HCC)    Asthma    CHF (congestive heart failure) (HCC)    Chronic edema    CKD (chronic kidney disease), stage III    COPD (chronic obstructive pulmonary disease) (HCC)    Degenerative lumbar disc    Depression    Diabetes mellitus without complication (HCC)    Diabetic neuropathy (HCC)    GERD (gastroesophageal reflux disease)    Gout    Hearing loss    Hypercholesteremia    Hypertension    Insomnia    Obstructive sleep apnea on CPAP    Osteoarthritis    knees and shoulders   Renal disorder    Urinary frequency  Family History  Problem Relation Age of Onset   Allergies Mother    Rheumatologic disease Mother    Cancer Mother        leukemia   Allergies Father    Asthma Father    Heart disease Father    Rheumatologic disease Father    Cancer Father        leukemia   Allergies Daughter    Cancer Sister    Breast cancer Maternal Aunt    Breast cancer Paternal Aunt    Breast cancer Maternal Aunt    Allergic rhinitis Neg Hx    Eczema Neg Hx    Urticaria Neg Hx      Past Surgical History:  Procedure Laterality Date   ABDOMINAL HYSTERECTOMY     KNEE ARTHROPLASTY Right 12/05/2017   Procedure: COMPUTER ASSISTED TOTAL KNEE ARTHROPLASTY;  Surgeon: Rod Can, MD;  Location: [facility];  Service: Orthopedics;   Laterality: Right;   ROTATOR CUFF REPAIR  1980   x3   TONSILLECTOMY      Social History   Socioeconomic History   Marital status: Married    Spouse name: Not on file   Number of children: Not on file   Years of education: Not on file   Highest education level: Not on file  Occupational History   Not on file  Social Needs   Financial resource strain: Not on file   Food insecurity    Worry: Not on file    Inability: Not on file   Transportation needs    Medical: Not on file    Non-medical: Not on file  Tobacco Use   Smoking status: Never Smoker   Smokeless tobacco: Never Used  Substance and Sexual Activity   Alcohol use: No    Alcohol/week: 0.0 standard drinks   Drug use: Never   Sexual activity: Not on file  Lifestyle   Physical activity    Days per week: Not on file    Minutes per session: Not on file   Stress: Not on file  Relationships   Social connections    Talks on phone: Not on file    Gets together: Not on file    Attends religious service: Not on file    Active member of club or organization: Not on file    Attends meetings of clubs or organizations: Not on file    Relationship status: Not on file   Intimate partner violence    Fear of current or ex partner: Not on file    Emotionally abused: Not on file    Physically abused: Not on file    Forced sexual activity: Not on file  Other Topics Concern   Not on file  Social History Narrative   Not on file     Allergies  Allergen Reactions   Other Shortness Of Breath and Other (See Comments)    ROSEMARY AND HEAVY SCENTED PERFUMES, DETERGENTS, ETC   Tape Itching, Rash and Other (See Comments)    Burns and blisters USE PAPER TAPE   Trazodone And Nefazodone Shortness Of Breath and Swelling   Celebrex [Celecoxib] Other (See Comments)    HARD TIME HEARING, LIKE BEING UNDERWATER   Latex Hives, Itching and Rash   Mysoline [Primidone] Other (See Comments)    unknown   Niacin  And Related Hives and Swelling   Sulfa Antibiotics Other (See Comments)    TONGUE BLISTERS     Aspirin Other (See Comments)    GI PROBLEMS  AND PAIN     Immunization History  Administered Date(s) Administered   Fluad Quad(high Dose 65+) 04/02/2018   Influenza,inj,Quad PF,6+ Mos 10/04/2014    Outpatient Medications Prior to Visit  Medication Sig Dispense Refill   albuterol (ACCUNEB) 0.63 MG/3ML nebulizer solution Take 1 ampule by nebulization 3 (three) times daily as needed for wheezing or shortness of breath.     albuterol (VENTOLIN HFA) 108 (90 Base) MCG/ACT inhaler Inhale 1-2 puffs into the lungs every 6 (six) hours as needed for wheezing or shortness of breath.     apixaban (ELIQUIS) 5 MG TABS tablet Take 5 mg by mouth 2 (two) times daily.     atorvastatin (LIPITOR) 10 MG tablet Take 10 mg by mouth at bedtime.      Azelastine-Fluticasone 137-50 MCG/ACT SUSP Place 1-2 sprays into the nose 2 (two) times daily as needed. 23 g 5   citalopram (CELEXA) 20 MG tablet Take 20 mg by mouth daily.     diltiazem (CARDIZEM CD) 240 MG 24 hr capsule Take 240 mg by mouth daily.     docusate sodium (COLACE) 100 MG capsule Take 1 capsule (100 mg total) by mouth 2 (two) times daily. 60 capsule 1   famotidine (PEPCID) 20 MG tablet Take 20 mg by mouth 2 (two) times daily.     fesoterodine (TOVIAZ) 4 MG TB24 tablet Take 4 mg by mouth daily.      fluticasone (FLONASE) 50 MCG/ACT nasal spray Place 1 spray into both nostrils daily.     furosemide (LASIX) 40 MG tablet Take 40 mg by mouth daily.      gabapentin (NEURONTIN) 600 MG tablet Take 1 tablet (600 mg total) by mouth 2 (two) times daily. (Patient taking differently: Take 600-1,200 mg by mouth See admin instructions. Take 600 mg by mouth in the morning and take 1200 mg by mouth at bedtime) 60 tablet 2   guaiFENesin (MUCINEX) 600 MG 12 hr tablet Take 600 mg by mouth 2 (two) times daily.     Insulin Glargine (BASAGLAR KWIKPEN) 100 UNIT/ML  SOPN Inject 6 Units into the skin daily.     insulin glargine (LANTUS) 100 UNIT/ML injection Inject 0.28 mLs (28 Units total) into the skin daily. 10 mL 11   ipratropium-albuterol (DUONEB) 0.5-2.5 (3) MG/3ML SOLN Take 3 mLs by nebulization 3 (three) times daily as needed (For wheezing).     liraglutide (VICTOZA) 18 MG/3ML SOPN Inject 1.2 mg into the skin daily.      loratadine (CLARITIN) 10 MG tablet Take 10 mg by mouth every morning.     losartan (COZAAR) 100 MG tablet Take 100 mg by mouth daily.     Menthol, Topical Analgesic, (BIOFREEZE) 4 % GEL Apply topically. Apply topically as needed to painful joints     montelukast (SINGULAIR) 10 MG tablet Take 1 tablet (10 mg total) by mouth at bedtime. 30 tablet 5   nystatin (NYSTATIN) powder Apply 1 g topically 4 (four) times daily as needed (for skin folds).      ondansetron (ZOFRAN) 4 MG tablet Take 1 tablet (4 mg total) by mouth every 6 (six) hours as needed for nausea. 20 tablet 0   polyethylene glycol (MIRALAX / GLYCOLAX) packet Take 17 g by mouth daily. (Patient taking differently: Take 17 g by mouth daily as needed for moderate constipation. ) 14 each 0   ranitidine (ZANTAC) 150 MG tablet Take 150 mg by mouth 2 (two) times daily.     rOPINIRole (REQUIP) 0.5 MG tablet  Take 1 mg by mouth at bedtime.     senna (SENOKOT) 8.6 MG TABS tablet Take 2 tablets (17.2 mg total) by mouth at bedtime. 120 each 0   tetrahydrozoline (VISINE) 0.05 % ophthalmic solution Place 1 drop into both eyes 4 (four) times daily as needed (for eye irritation).      Tiotropium Bromide Monohydrate (SPIRIVA RESPIMAT) 2.5 MCG/ACT AERS Inhale 2 puffs into the lungs 1 day or 1 dose. 4 g 5   budesonide-formoterol (SYMBICORT) 160-4.5 MCG/ACT inhaler Inhale 2 puffs into the lungs 2 (two) times daily.     oxyCODONE (OXY IR/ROXICODONE) 5 MG immediate release tablet Take 5 mg by mouth. Give one tablet by mouth every( 8) eight hours     simethicone (MYLICON) 125 MG  chewable tablet Chew 125 mg by mouth every 6 (six) hours as needed for flatulence.     No facility-administered medications prior to visit.     Review of Systems  Constitutional: Negative for chills, fever and weight loss.  HENT: Positive for congestion.        Postnasal drip  Eyes: Negative.   Respiratory: Positive for cough, shortness of breath and wheezing. Negative for sputum production.   Cardiovascular: Negative for chest pain and leg swelling.  Gastrointestinal: Positive for heartburn. Negative for abdominal pain, diarrhea, nausea and vomiting.  Genitourinary: Negative.   Musculoskeletal: Negative.   Skin: Negative for rash.  Neurological: Negative for weakness and headaches.  Endo/Heme/Allergies: Positive for environmental allergies.  Psychiatric/Behavioral: Positive for depression. The patient is nervous/anxious.      Objective:   Vitals:   12/02/18 1150  BP: (!) 150/70  Pulse: 78  Temp: (!) 97.2 F (36.2 C)  TempSrc: Temporal  SpO2: 92%  Weight: 268 lb 12.8 oz (121.9 kg)  Height: 5' 4.57" (1.64 m)   92% on   RA BMI Readings from Last 3 Encounters:  12/02/18 45.33 kg/m  11/06/18 44.75 kg/m  12/13/17 40.34 kg/m   Wt Readings from Last 3 Encounters:  12/02/18 268 lb 12.8 oz (121.9 kg)  11/06/18 264 lb 12.8 oz (120.1 kg)  12/13/17 235 lb (106.6 kg)    Physical Exam Vitals signs reviewed.  Constitutional:      Appearance: Normal appearance. She is obese. She is not diaphoretic.  HENT:     Head: Normocephalic and atraumatic.     Nose:     Comments: Deferred due to masking requirement.    Mouth/Throat:     Comments: Deferred due to masking requirement. Eyes:     General: No scleral icterus. Neck:     Musculoskeletal: Neck supple.  Cardiovascular:     Rate and Rhythm: Normal rate. Rhythm irregular.     Heart sounds: No murmur.  Pulmonary:     Comments: Breathing comfortably on room air, no accessory muscle use or conversational dyspnea. Audible  wheezing with deeper breaths.  Occasional wet sounding cough.  Diffuse wheezing bilaterally. Abdominal:     General: There is no distension.     Palpations: Abdomen is soft.     Tenderness: There is no abdominal tenderness.  Musculoskeletal:        General: No deformity.     Comments: Compression stockings in place, no significant edema  Lymphadenopathy:     Cervical: No cervical adenopathy.  Skin:    General: Skin is warm and dry.     Findings: No rash.  Neurological:     General: No focal deficit present.     Mental Status: She is  alert.     Motor: No weakness.     Coordination: Coordination normal.  Psychiatric:        Mood and Affect: Mood normal.        Behavior: Behavior normal.      CBC    Component Value Date/Time   WBC 9.2 11/07/2018 1655   WBC 10.8 (H) 12/08/2017 0439   RBC 3.92 11/07/2018 1655   RBC 3.08 (L) 12/08/2017 0439   HGB 11.2 11/07/2018 1655   HCT 33.7 (L) 11/07/2018 1655   PLT 209 11/07/2018 1655   MCV 86 11/07/2018 1655   MCH 28.6 11/07/2018 1655   MCH 28.2 12/08/2017 0439   MCHC 33.2 11/07/2018 1655   MCHC 31.4 12/08/2017 0439   RDW 13.1 11/07/2018 1655   LYMPHSABS 1.8 11/07/2018 1655   MONOABS 0.4 06/26/2017 0614   EOSABS 0.5 (H) 11/07/2018 1655   BASOSABS 0.0 11/07/2018 1655    CHEMISTRY No results for input(s): NA, K, CL, CO2, GLUCOSE, BUN, CREATININE, CALCIUM, MG, PHOS in the last 168 hours. CrCl cannot be calculated (Patient's most recent lab result is older than the maximum 21 days allowed.).  Alpha-1 antitrypsin phenotype MM, 146 Alpha gal panel negative IgE 247 Allergy panel negative (per Dr. Sheran Fava notes, skin testing was positive for major mold mix #4)  Chest Imaging- films reviewed: CXR, 2 view 11/22/2017-prominent hila bilaterally, airway thickening.  Loss of AP window on the left.  Pulmonary Functions Testing Results: No flowsheet data found.  11/06/2018 office spirometry: FVC 1.64 (56%)--> 1.48 (51%) , -10% FEV1  1.2 (55%) --> 1.04 (47%), -13% Ratio 0.73--> 0.70 Expiratory loop consistent with obstruction   Echocardiogram 04/06/2016: LVEF 60 to 65%, indeterminate diastolic function.  Mildly dilated LA, normal RV, mildly dilated RA.  Enlarged IVC with reduced changes with respiration.  Mild TR, otherwise normal valves.      Assessment & Plan:     ICD-10-CM   1. Severe persistent asthma without complication  J45.50   2. Allergic rhinitis with a predominantly nonallergic component  J30.89   3. Abnormal CXR  R93.89 DG Chest 2 View  4. Shortness of breath  R06.02 CT Chest W Contrast    DG Chest 2 View    Severe persistent asthma -Continue triple inhaled therapy -Continue Singulair daily -I agree with the plan to start an anti-IL-5 biologic.  The paperwork for this has been initiated at the Allergist's office, and she has follow up scheduled with them. -Given her improving symptoms at home, we can continue to monitor without restarting steroids.  I am hopeful that she will be able to start on a biologic before she needs more steroids. -Albuterol as needed -Optimize GERD management -Optimize allergic rhinosinusitis -Flu shot today  Abnormal chest x-ray in 2019 with increased left hilar prominence.  Reported history of recent pleural effusion. - in- office CXR to evaluate for persistence of pleural effusion that may require intervention (resolved on CXR) -CT chest to evaluate left hilar prominence persistent on CXR  RTC in 3 months.     Current Outpatient Medications:    albuterol (ACCUNEB) 0.63 MG/3ML nebulizer solution, Take 1 ampule by nebulization 3 (three) times daily as needed for wheezing or shortness of breath., Disp: , Rfl:    albuterol (VENTOLIN HFA) 108 (90 Base) MCG/ACT inhaler, Inhale 1-2 puffs into the lungs every 6 (six) hours as needed for wheezing or shortness of breath., Disp: , Rfl:    apixaban (ELIQUIS) 5 MG TABS tablet, Take 5 mg by  mouth 2 (two) times daily., Disp: ,  Rfl:    atorvastatin (LIPITOR) 10 MG tablet, Take 10 mg by mouth at bedtime. , Disp: , Rfl:    Azelastine-Fluticasone 137-50 MCG/ACT SUSP, Place 1-2 sprays into the nose 2 (two) times daily as needed., Disp: 23 g, Rfl: 5   citalopram (CELEXA) 20 MG tablet, Take 20 mg by mouth daily., Disp: , Rfl:    diltiazem (CARDIZEM CD) 240 MG 24 hr capsule, Take 240 mg by mouth daily., Disp: , Rfl:    docusate sodium (COLACE) 100 MG capsule, Take 1 capsule (100 mg total) by mouth 2 (two) times daily., Disp: 60 capsule, Rfl: 1   famotidine (PEPCID) 20 MG tablet, Take 20 mg by mouth 2 (two) times daily., Disp: , Rfl:    fesoterodine (TOVIAZ) 4 MG TB24 tablet, Take 4 mg by mouth daily. , Disp: , Rfl:    fluticasone (FLONASE) 50 MCG/ACT nasal spray, Place 1 spray into both nostrils daily., Disp: , Rfl:    furosemide (LASIX) 40 MG tablet, Take 40 mg by mouth daily. , Disp: , Rfl:    gabapentin (NEURONTIN) 600 MG tablet, Take 1 tablet (600 mg total) by mouth 2 (two) times daily. (Patient taking differently: Take 600-1,200 mg by mouth See admin instructions. Take 600 mg by mouth in the morning and take 1200 mg by mouth at bedtime), Disp: 60 tablet, Rfl: 2   guaiFENesin (MUCINEX) 600 MG 12 hr tablet, Take 600 mg by mouth 2 (two) times daily., Disp: , Rfl:    Insulin Glargine (BASAGLAR KWIKPEN) 100 UNIT/ML SOPN, Inject 6 Units into the skin daily., Disp: , Rfl:    insulin glargine (LANTUS) 100 UNIT/ML injection, Inject 0.28 mLs (28 Units total) into the skin daily., Disp: 10 mL, Rfl: 11   ipratropium-albuterol (DUONEB) 0.5-2.5 (3) MG/3ML SOLN, Take 3 mLs by nebulization 3 (three) times daily as needed (For wheezing)., Disp: , Rfl:    liraglutide (VICTOZA) 18 MG/3ML SOPN, Inject 1.2 mg into the skin daily. , Disp: , Rfl:    loratadine (CLARITIN) 10 MG tablet, Take 10 mg by mouth every morning., Disp: , Rfl:    losartan (COZAAR) 100 MG tablet, Take 100 mg by mouth daily., Disp: , Rfl:    Menthol, Topical  Analgesic, (BIOFREEZE) 4 % GEL, Apply topically. Apply topically as needed to painful joints, Disp: , Rfl:    montelukast (SINGULAIR) 10 MG tablet, Take 1 tablet (10 mg total) by mouth at bedtime., Disp: 30 tablet, Rfl: 5   nystatin (NYSTATIN) powder, Apply 1 g topically 4 (four) times daily as needed (for skin folds). , Disp: , Rfl:    ondansetron (ZOFRAN) 4 MG tablet, Take 1 tablet (4 mg total) by mouth every 6 (six) hours as needed for nausea., Disp: 20 tablet, Rfl: 0   polyethylene glycol (MIRALAX / GLYCOLAX) packet, Take 17 g by mouth daily. (Patient taking differently: Take 17 g by mouth daily as needed for moderate constipation. ), Disp: 14 each, Rfl: 0   ranitidine (ZANTAC) 150 MG tablet, Take 150 mg by mouth 2 (two) times daily., Disp: , Rfl:    rOPINIRole (REQUIP) 0.5 MG tablet, Take 1 mg by mouth at bedtime., Disp: , Rfl:    senna (SENOKOT) 8.6 MG TABS tablet, Take 2 tablets (17.2 mg total) by mouth at bedtime., Disp: 120 each, Rfl: 0   tetrahydrozoline (VISINE) 0.05 % ophthalmic solution, Place 1 drop into both eyes 4 (four) times daily as needed (for eye irritation). ,  Disp: , Rfl:    Tiotropium Bromide Monohydrate (SPIRIVA RESPIMAT) 2.5 MCG/ACT AERS, Inhale 2 puffs into the lungs 1 day or 1 dose., Disp: 4 g, Rfl: 5   budesonide-formoterol (SYMBICORT) 160-4.5 MCG/ACT inhaler, Inhale 2 puffs into the lungs 2 (two) times daily., Disp: , Rfl:    oxyCODONE (OXY IR/ROXICODONE) 5 MG immediate release tablet, Take 5 mg by mouth. Give one tablet by mouth every( 8) eight hours, Disp: , Rfl:    pantoprazole (PROTONIX) 40 MG tablet, Take 1 tablet (40 mg total) by mouth daily., Disp: 30 tablet, Rfl: 11   simethicone (MYLICON) 125 MG chewable tablet, Chew 125 mg by mouth every 6 (six) hours as needed for flatulence., Disp: , Rfl:    Steffanie Dunn, DO [facility] 12/02/2018 12:27 PM

## 2018-12-02 NOTE — Patient Instructions (Addendum)
Thank you for visiting Dr. Carlis Abbott at [facility]. We recommend the following: Orders Placed This Encounter  Procedures  . CT Chest W Contrast  . DG Chest 2 View   Orders Placed This Encounter  Procedures  . CT Chest W Contrast    Standing Status:   Future    Standing Expiration Date:   02/02/2020    Scheduling Instructions:     Mid February- March    Order Specific Question:   ** REASON FOR EXAM (FREE TEXT)    Answer:   abnormal 2019 CXR- left hila,  [facility]Os at [facility] in [facility] in November 2020    Order Specific Question:   If indicated for the ordered procedure, I authorize the administration of contrast media per Radiology protocol    Answer:   Yes    Order Specific Question:   Preferred imaging location?    Answer:   [facility]    Order Specific Question:   Radiology Contrast Protocol - do NOT remove file path    Answer:   \\[email or web address]\CTProtocols.pdf  . DG Chest 2 View    Standing Status:   Future    Standing Expiration Date:   02/02/2020    Order Specific Question:   Reason for Exam (SYMPTOM  OR DIAGNOSIS REQUIRED)    Answer:   history of pleural effusion    Order Specific Question:   Preferred imaging location?    Answer:   Internal    Order Specific Question:   Radiology Contrast Protocol - do NOT remove file path    Answer:   \\[email or web address]\DXFluoroContrastProtocols.pdf    Meds ordered this encounter  Medications  . pantoprazole (PROTONIX) 40 MG tablet    Sig: Take 1 tablet (40 mg total) by mouth daily.    Dispense:  30 tablet    Refill:  11     Keep taking famotidine for reflux. Try to cut back on carbonated beverages to control acid reflux.    Return in about 3 months (around 03/02/2019).    Please do your part to reduce the spread of COVID-19.    Gastroesophageal Reflux Disease, Adult Gastroesophageal reflux (GER) happens when acid from the stomach flows up into the tube that connects the mouth and the stomach (esophagus).  Normally, food travels down the esophagus and stays in the stomach to be digested. However, when a person has GER, food and stomach acid sometimes move back up into the esophagus. If this becomes a more serious problem, the person may be diagnosed with a disease called gastroesophageal reflux disease (GERD). GERD occurs when the reflux:  Happens often.  Causes frequent or severe symptoms.  Causes problems such as damage to the esophagus. When stomach acid comes in contact with the esophagus, the acid may cause soreness (inflammation) in the esophagus. Over time, GERD may create small holes (ulcers) in the lining of the esophagus. What are the causes? This condition is caused by a problem with the muscle between the esophagus and the stomach (lower esophageal sphincter, or LES). Normally, the LES muscle closes after food passes through the esophagus to the stomach. When the LES is weakened or abnormal, it does not close properly, and that allows food and stomach acid to go back up into the esophagus. The LES can be weakened by certain dietary substances, medicines, and medical conditions, including:  Tobacco use.  Pregnancy.  Having a hiatal hernia.  Alcohol use.  Certain foods and beverages, such as coffee, chocolate, onions, and peppermint. What  increases the risk? You are more likely to develop this condition if you:  Have an increased body weight.  Have a connective tissue disorder.  Use NSAID medicines. What are the signs or symptoms? Symptoms of this condition include:  Heartburn.  Difficult or painful swallowing.  The feeling of having a lump in the throat.  Abitter taste in the mouth.  Bad breath.  Having a large amount of saliva.  Having an upset or bloated stomach.  Belching.  Chest pain. Different conditions can cause chest pain. Make sure you see your health care provider if you experience chest pain.  Shortness of breath or wheezing.  Ongoing (chronic)  cough or a night-time cough.  Wearing away of tooth enamel.  Weight loss. How is this diagnosed? Your health care provider will take a medical history and perform a physical exam. To determine if you have mild or severe GERD, your health care provider may also monitor how you respond to treatment. You may also have tests, including:  A test to examine your stomach and esophagus with a small camera (endoscopy).  A test thatmeasures the acidity level in your esophagus.  A test thatmeasures how much pressure is on your esophagus.  A barium swallow or modified barium swallow test to show the shape, size, and functioning of your esophagus. How is this treated? The goal of treatment is to help relieve your symptoms and to prevent complications. Treatment for this condition may vary depending on how severe your symptoms are. Your health care provider may recommend:  Changes to your diet.  Medicine.  Surgery. Follow these instructions at home: Eating and drinking   Follow a diet as recommended by your health care provider. This may involve avoiding foods and drinks such as: ? Coffee and tea (with or without caffeine). ? Drinks that containalcohol. ? Energy drinks and sports drinks. ? Carbonated drinks or sodas. ? Chocolate and cocoa. ? Peppermint and mint flavorings. ? Garlic and onions. ? Horseradish. ? Spicy and acidic foods, including peppers, chili powder, curry powder, vinegar, hot sauces, and barbecue sauce. ? Citrus fruit juices and citrus fruits, such as oranges, lemons, and limes. ? Tomato-based foods, such as red sauce, chili, salsa, and pizza with red sauce. ? Fried and fatty foods, such as donuts, french fries, potato chips, and high-fat dressings. ? High-fat meats, such as hot dogs and fatty cuts of red and white meats, such as rib eye steak, sausage, ham, and bacon. ? High-fat dairy items, such as whole milk, butter, and cream cheese.  Eat small, frequent meals  instead of large meals.  Avoid drinking large amounts of liquid with your meals.  Avoid eating meals during the 2-3 hours before bedtime.  Avoid lying down right after you eat.  Do not exercise right after you eat. Lifestyle   Do not use any products that contain nicotine or tobacco, such as cigarettes, e-cigarettes, and chewing tobacco. If you need help quitting, ask your health care provider.  Try to reduce your stress by using methods such as yoga or meditation. If you need help reducing stress, ask your health care provider.  If you are overweight, reduce your weight to an amount that is healthy for you. Ask your health care provider for guidance about a safe weight loss goal. General instructions  Pay attention to any changes in your symptoms.  Take over-the-counter and prescription medicines only as told by your health care provider. Do not take aspirin, ibuprofen, or other NSAIDs unless your  health care provider told you to do so.  Wear loose-fitting clothing. Do not wear anything tight around your waist that causes pressure on your abdomen.  Raise (elevate) the head of your bed about 6 inches (15 cm).  Avoid bending over if this makes your symptoms worse.  Keep all follow-up visits as told by your health care provider. This is important. Contact a health care provider if:  You have: ? New symptoms. ? Unexplained weight loss. ? Difficulty swallowing or it hurts to swallow. ? Wheezing or a persistent cough. ? A hoarse voice.  Your symptoms do not improve with treatment. Get help right away if you:  Have pain in your arms, neck, jaw, teeth, or back.  Feel sweaty, dizzy, or light-headed.  Have chest pain or shortness of breath.  Vomit and your vomit looks like blood or coffee grounds.  Faint.  Have stool that is bloody or black.  Cannot swallow, drink, or eat. Summary  Gastroesophageal reflux happens when acid from the stomach flows up into the esophagus.  GERD is a disease in which the reflux happens often, causes frequent or severe symptoms, or causes problems such as damage to the esophagus.  Treatment for this condition may vary depending on how severe your symptoms are. Your health care provider may recommend diet and lifestyle changes, medicine, or surgery.  Contact a health care provider if you have new or worsening symptoms.  Take over-the-counter and prescription medicines only as told by your health care provider. Do not take aspirin, ibuprofen, or other NSAIDs unless your health care provider told you to do so.  Keep all follow-up visits as told by your health care provider. This is important. This information is not intended to replace advice given to you by your health care provider. Make sure you discuss any questions you have with your health care provider. Document Released: 09/27/2004 Document Revised: 06/26/2017 Document Reviewed: 06/26/2017 Elsevier Patient Education  2020 [employer]

## 2018-12-05 ENCOUNTER — Telehealth: Payer: Self-pay | Admitting: Critical Care Medicine

## 2018-12-05 NOTE — Telephone Encounter (Signed)
I spoke to her, thanks!

## 2018-12-05 NOTE — Telephone Encounter (Signed)
Phone number we have listed for Dr. Jimmye Norman is [phone]

## 2018-12-05 NOTE — Telephone Encounter (Signed)
Do we have a phone number? I am happy to call her back really quickly.  LPC

## 2018-12-05 NOTE — Telephone Encounter (Signed)
Routing to Dr. Carlis Abbott so she can contact Dr. Jimmye Norman.

## 2018-12-08 NOTE — Addendum Note (Signed)
Addended by: Lia Foyer R on: 12/08/2018 04:06 PM   Modules accepted: Orders

## 2018-12-11 ENCOUNTER — Telehealth: Payer: Self-pay | Admitting: Allergy and Immunology

## 2018-12-11 NOTE — Telephone Encounter (Signed)
Please inform the patient that based upon her lab results she is a good candidate for an anti-IL 5 biologic agent for asthma and answer any questions she may have.  Please submit paperwork for benralizumab or mepolizomab, depending on which one will cost her less based on her coverage. Please send results to the patient's primary care physician/referring physician. Thank you.

## 2018-12-11 NOTE — Telephone Encounter (Signed)
Routed notes via fax

## 2018-12-11 NOTE — Telephone Encounter (Signed)
Dr. Jimmye Norman called about notes she received on this patient, and test results. She said it says they discussed new medication for the patient, but didn't say what the medication was. She also said any refill request need to go through [facility].

## 2019-01-15 ENCOUNTER — Ambulatory Visit: Payer: Self-pay | Admitting: Allergy and Immunology

## 2019-01-22 ENCOUNTER — Encounter: Payer: Self-pay | Admitting: Allergy and Immunology

## 2019-01-22 ENCOUNTER — Ambulatory Visit (INDEPENDENT_AMBULATORY_CARE_PROVIDER_SITE_OTHER): Payer: Medicare (Managed Care) | Admitting: Family Medicine

## 2019-01-22 ENCOUNTER — Other Ambulatory Visit: Payer: Self-pay

## 2019-01-22 VITALS — BP 130/60 | HR 90 | Temp 96.7°F | Resp 19

## 2019-01-22 DIAGNOSIS — R053 Chronic cough: Secondary | ICD-10-CM

## 2019-01-22 DIAGNOSIS — J3089 Other allergic rhinitis: Secondary | ICD-10-CM

## 2019-01-22 DIAGNOSIS — J4551 Severe persistent asthma with (acute) exacerbation: Secondary | ICD-10-CM

## 2019-01-22 DIAGNOSIS — K219 Gastro-esophageal reflux disease without esophagitis: Secondary | ICD-10-CM

## 2019-01-22 DIAGNOSIS — R05 Cough: Secondary | ICD-10-CM | POA: Diagnosis not present

## 2019-01-22 MED ORDER — ADVAIR HFA 230-21 MCG/ACT IN AERO: INHALATION_SPRAY | RESPIRATORY_TRACT | 5 refills | Status: DC

## 2019-01-22 NOTE — Patient Instructions (Addendum)
Asthma Begin Advair 230-2 puffs twice a day with a spacer to prevent cough or wheeze Continue Spiriva 2.5 mcg-2 puffs once a day to prevent cough or wheeze Continue montelukast 10 mg once a day to prevent cough or wheeze Your labs indicate you may benefit from an injectable medication to help control your asthma. You will hear from out biologics coordinator, Tammy with next steps to take  Allergic rhinitis Consider saline nasal rinses as needed for nasal symptoms. Use this before any medicated nasal sprays for best result Continue Flonase 2 sprays in each nostril once a day for a stuffy nose For thick post nasal drainage, continue Mucinex [phone] mg twice a day Continue mold avoidance measures as listed below  Reflux Continue dietary and lifestyle modifications as listed below Continue pantoprazole 40 mg once a day as previously prescribed  Cough Continue with the plan above for asthma, allergic rhinitis, and reflux Delsym samples provided. Take 10 mL once every 12 hours as needed for cough  Call the clinic if this treatment plan is not working well for you  Follow up in 1 month or sooner if needed.   Lifestyle Changes for Controlling GERD When you have GERD, stomach acid feels as if it's backing up toward your mouth. Whether or not you take medication to control your GERD, your symptoms can often be improved with lifestyle changes.   Raise Your Head  Reflux is more likely to strike when you're lying down flat, because stomach fluid can  flow backward more easily. Raising the head of your bed 4-6 inches can help. To do this:  Slide blocks or books under the legs at the head of your bed. Or, place a wedge under  the mattress. Many foam stores can make a suitable wedge for you. The wedge  should run from your waist to the top of your head.  Don't just prop your head on several pillows. This increases pressure on your  stomach. It can make GERD worse.  Watch Your Eating  Habits Certain foods may increase the acid in your stomach or relax the lower esophageal sphincter, making GERD more likely. It's best to avoid the following:  Coffee, tea, and carbonated drinks (with and without caffeine)  Fatty, fried, or spicy food  Mint, chocolate, onions, and tomatoes  Any other foods that seem to irritate your stomach or cause you pain  Relieve the Pressure  Eat smaller meals, even if you have to eat more often.  Don't lie down right after you eat. Wait a few hours for your stomach to empty.  Avoid tight belts and tight-fitting clothes.  Lose excess weight.  Tobacco and Alcohol  Avoid smoking tobacco and drinking alcohol. They can make GERD symptoms worse.  Control of Mold Allergen Mold and fungi can grow on a variety of surfaces provided certain temperature and moisture conditions exist.  Outdoor molds grow on plants, decaying vegetation and soil.  The major outdoor mold, Alternaria and Cladosporium, are found in very high numbers during hot and dry conditions.  Generally, a late Summer - Fall peak is seen for common outdoor fungal spores.  Rain will temporarily lower outdoor mold spore count, but counts rise rapidly when the rainy period ends.  The most important indoor molds are Aspergillus and Penicillium.  Dark, humid and poorly ventilated basements are ideal sites for mold growth.  The next most common sites of mold growth are the bathroom and the kitchen.  Outdoor [employer] 1. Use air conditioning  and keep windows closed 2. Avoid exposure to decaying vegetation. 3. Avoid leaf raking. 4. Avoid grain handling. 5. Consider wearing a face mask if working in moldy areas.  Indoor Mold Control 1. Maintain humidity below 50%. 2. Clean washable surfaces with 5% bleach solution. 3. Remove sources e.g. Contaminated carpets.

## 2019-01-22 NOTE — Progress Notes (Addendum)
[address] [place] [address] Dept: [phone]  FOLLOW UP NOTE  Patient ID: Elaine Lee, female    DOB: 1945-04-30  Age: 74 y.o. MRN: [id number] Date of Office Visit: 01/22/2019  Assessment  Chief Complaint: Asthma and Allergic Rhinitis   HPI Elaine Lee is a 74 year old female who presents to the clinic for follow-up visit.  She was last seen in this clinic on 11/06/2018 by Dr. Verlin Fester for evaluation of severe persistent asthma, allergic rhinitis with a predominantly nonallergic component, and persistent cough.  Her concurrent pertinent medical conditions include COPD, atrial fibrillation, congestive heart failure, and reflux.  She reports her asthma has been well controlled with symptoms including shortness of breath with activity and rest, wheeze " all the time", and cough which is the worst when lying down or with activity.  She continues taking montelukast 10 mg once a day, Advair Diskus 500-1 puff twice a day, Spiriva 2.5 mcg 2 puffs in the morning, and frequent nebulizer use.  She reports that she has used an HFA inhaler without a spacer that increased her cough.  At her last visit lab work indicated she may benefit from an anti-IL 5 biologic agent.  Allergic rhinitis is reported as poorly controlled with frequent throat clearing, yellow nasal drainage, thick postnasal drainage, and sneezing.  She continues Flonase once a day, and Mucinex twice a day.  She reports that she has been experiencing a cough for several years, however, she reports it has worsened over the last 3 months.  She continues to use a flutter valve occasionally.  Her current medications are listed in the chart.   Drug Allergies:  Allergies  Allergen Reactions  . Other Shortness Of Breath and Other (See Comments)    ROSEMARY AND HEAVY SCENTED PERFUMES, DETERGENTS, ETC  . Tape Itching, Rash and Other (See Comments)    Burns and blisters USE PAPER TAPE  . Trazodone And Nefazodone Shortness  Of Breath and Swelling  . Celebrex [Celecoxib] Other (See Comments)    HARD TIME HEARING, LIKE BEING UNDERWATER  . Latex Hives, Itching and Rash  . Mysoline [Primidone] Other (See Comments)    unknown  . Niacin And Related Hives and Swelling  . Sulfa Antibiotics Other (See Comments)    TONGUE BLISTERS    . Aspirin Other (See Comments)    GI PROBLEMS AND PAIN    Physical Exam: BP 130/60 (BP Location: Left Arm, Patient Position: Sitting, Cuff Size: Large)   Pulse 90   Temp (!) 96.7 F (35.9 C) (Temporal)   Resp 19   SpO2 94%    Physical Exam Vitals reviewed.  Constitutional:      Appearance: Normal appearance.  HENT:     Head: Normocephalic and atraumatic.     Right Ear: Tympanic membrane normal.     Left Ear: Tympanic membrane normal.     Nose:     Comments: Bilateral nares erythematous with clear nasal drainage noted.  Pharynx erythematous with no exudate.  Ears normal.  Eyes normal. Eyes:     Conjunctiva/sclera: Conjunctivae normal.  Cardiovascular:     Rate and Rhythm: Normal rate and regular rhythm.     Heart sounds: Normal heart sounds. No murmur.  Pulmonary:     Comments: Lateral expiratory wheeze noted in all lung fields.  Wheeze did not change post bronchodilator therapy Musculoskeletal:        General: Normal range of motion.     Cervical back: Normal range of motion  and neck supple.  Skin:    General: Skin is warm and dry.  Neurological:     Mental Status: She is alert and oriented to person, place, and time.  Psychiatric:        Mood and Affect: Mood normal.        Behavior: Behavior normal.        Thought Content: Thought content normal.        Judgment: Judgment normal.    Diagnostics: FVC 1.48, FEV1 1.13. Predicted FVC 2.93, predicted FEV1 2.20. Spirometry indicates moderate restriction. Post bronchodilator therapy FVC 1.70, FEV1 1.28 indicating moderate restriction with a significant improvement in FVC (15%) and FEV1 (13%).  Assessment and  Plan: 1. Severe persistent asthma with acute exacerbation   2. Allergic rhinitis with a predominantly nonallergic component   3. Cough, persistent   4. Gastroesophageal reflux disease, unspecified whether esophagitis present     Meds ordered this encounter  Medications  . fluticasone-salmeterol (ADVAIR HFA) 230-21 MCG/ACT inhaler    Sig: Two puffs with spacer twice a day    Dispense:  12 g    Refill:  5    Patient Instructions  Asthma Begin Advair 230-2 puffs twice a day with a spacer to prevent cough or wheeze Continue Spiriva 2.5 mcg-2 puffs once a day to prevent cough or wheeze Continue montelukast 10 mg once a day to prevent cough or wheeze Your labs indicate you may benefit from an injectable medication to help control your asthma. You will hear from out biologics coordinator, Tammy with next steps to take  Allergic rhinitis Consider saline nasal rinses as needed for nasal symptoms. Use this before any medicated nasal sprays for best result Continue Flonase 2 sprays in each nostril once a day for a stuffy nose For thick post nasal drainage, continue Mucinex [phone] mg twice a day Continue mold avoidance measures as listed below  Reflux Continue dietary and lifestyle modifications as listed below Continue pantoprazole 40 mg once a day as previously prescribed  Cough Continue with the plan above for asthma, allergic rhinitis, and reflux Delsym samples provided. Take 10 mL once every 12 hours as needed for cough  Call the clinic if this treatment plan is not working well for you  Follow up in 1 month or sooner if needed.  Return in about 4 weeks (around 02/19/2019).    Thank you for the opportunity to care for this patient.  Please do not hesitate to contact me with questions.  Thermon Leyland, FNP [facility]  ________________________________________________  I have provided oversight concerning [facility]'s evaluation and treatment of this  patient's health issues addressed during today's encounter.  I agree with the assessment and therapeutic plan as outlined in the note.   Signed,   R Jorene Guest, MD

## 2019-02-03 ENCOUNTER — Telehealth: Payer: Self-pay

## 2019-02-03 NOTE — Telephone Encounter (Signed)
Patient called inquiring about biologic medication Dr. Nunzio Cobbs and [facility] discussed with her on her OV 01/22/2019.  Patient said we would need to submit information to [facility]  Her insurance is in EPIC.  Please call patient with update on status.

## 2019-02-05 NOTE — Telephone Encounter (Signed)
L/m for patient to contact me to dicsuss. With her Ins need to know if she has special pharmacy she has to use

## 2019-02-06 ENCOUNTER — Other Ambulatory Visit: Payer: Self-pay | Admitting: Internal Medicine

## 2019-02-06 DIAGNOSIS — J302 Other seasonal allergic rhinitis: Secondary | ICD-10-CM

## 2019-02-06 DIAGNOSIS — J45909 Unspecified asthma, uncomplicated: Secondary | ICD-10-CM

## 2019-02-06 DIAGNOSIS — J984 Other disorders of lung: Secondary | ICD-10-CM

## 2019-02-10 NOTE — Telephone Encounter (Signed)
Talked to patient and advised that I will reach out to Ins and then let her know what they advise. I did call [facility] and they advised that they will have to reach out to PCP that makes decisions and referrals and see if the concur with treatment plan and they will reach back out to me

## 2019-02-16 MED ORDER — SODIUM CHLORIDE FLUSH 0.9 % IV SOLN
5.00 | INTRAVENOUS | Status: DC
Start: 2019-02-16 — End: 2019-02-16

## 2019-02-16 MED ORDER — MONTELUKAST SODIUM 10 MG PO TABS
10.00 | ORAL_TABLET | ORAL | Status: DC
Start: 2019-02-17 — End: 2019-02-16

## 2019-02-16 MED ORDER — GENERIC EXTERNAL MEDICATION
Status: DC
Start: 2019-02-16 — End: 2019-02-16

## 2019-02-16 MED ORDER — PREDNISONE 20 MG PO TABS
20.00 | ORAL_TABLET | ORAL | Status: DC
Start: 2019-02-16 — End: 2019-02-16

## 2019-02-16 MED ORDER — LOSARTAN POTASSIUM 50 MG PO TABS
100.00 | ORAL_TABLET | ORAL | Status: DC
Start: 2019-02-17 — End: 2019-02-16

## 2019-02-16 MED ORDER — INSULIN LISPRO 100 UNIT/ML ~~LOC~~ SOLN
2.00 | SUBCUTANEOUS | Status: DC
Start: 2019-02-16 — End: 2019-02-16

## 2019-02-16 MED ORDER — SODIUM CHLORIDE FLUSH 0.9 % IV SOLN
5.00 | INTRAVENOUS | Status: DC
Start: ? — End: 2019-02-16

## 2019-02-16 MED ORDER — GUAIFENESIN ER 600 MG PO TB12
600.00 | ORAL_TABLET | ORAL | Status: DC
Start: 2019-02-16 — End: 2019-02-16

## 2019-02-16 MED ORDER — DEXTROMETHORPHAN-GUAIFENESIN 10-100 MG/5ML PO LIQD
5.00 | ORAL | Status: DC
Start: ? — End: 2019-02-16

## 2019-02-16 MED ORDER — TRAMADOL HCL 50 MG PO TABS
50.00 | ORAL_TABLET | ORAL | Status: DC
Start: ? — End: 2019-02-16

## 2019-02-16 MED ORDER — GLUCOSE 40 % PO GEL
15.00 | ORAL | Status: DC
Start: ? — End: 2019-02-16

## 2019-02-16 MED ORDER — GLUCAGON (RDNA) 1 MG IJ KIT
1.00 | PACK | INTRAMUSCULAR | Status: DC
Start: ? — End: 2019-02-16

## 2019-02-16 MED ORDER — FUROSEMIDE 40 MG PO TABS
40.00 | ORAL_TABLET | ORAL | Status: DC
Start: ? — End: 2019-02-16

## 2019-02-16 MED ORDER — INSULIN GLARGINE 100 UNIT/ML ~~LOC~~ SOLN
30.00 | SUBCUTANEOUS | Status: DC
Start: 2019-02-16 — End: 2019-02-16

## 2019-02-16 MED ORDER — GABAPENTIN 400 MG PO CAPS
400.00 | ORAL_CAPSULE | ORAL | Status: DC
Start: 2019-02-16 — End: 2019-02-16

## 2019-02-16 MED ORDER — SENNOSIDES-DOCUSATE SODIUM 8.6-50 MG PO TABS
1.00 | ORAL_TABLET | ORAL | Status: DC
Start: ? — End: 2019-02-16

## 2019-02-16 MED ORDER — RIVAROXABAN 20 MG PO TABS
20.00 | ORAL_TABLET | ORAL | Status: DC
Start: 2019-02-16 — End: 2019-02-16

## 2019-02-16 MED ORDER — DILTIAZEM HCL ER BEADS 120 MG PO CP24
240.00 | ORAL_CAPSULE | ORAL | Status: DC
Start: 2019-02-16 — End: 2019-02-16

## 2019-02-16 MED ORDER — CETIRIZINE HCL 10 MG PO TABS
10.00 | ORAL_TABLET | ORAL | Status: DC
Start: 2019-02-16 — End: 2019-02-16

## 2019-02-16 MED ORDER — ROPINIROLE HCL 1 MG PO TABS
1.00 | ORAL_TABLET | ORAL | Status: DC
Start: 2019-02-16 — End: 2019-02-16

## 2019-02-16 MED ORDER — PANTOPRAZOLE SODIUM 40 MG PO TBEC
40.00 | DELAYED_RELEASE_TABLET | ORAL | Status: DC
Start: 2019-02-17 — End: 2019-02-16

## 2019-02-16 MED ORDER — DEXTROSE 10 % IV SOLN
125.00 | INTRAVENOUS | Status: DC
Start: ? — End: 2019-02-16

## 2019-02-16 MED ORDER — FLUTICASONE PROPIONATE 50 MCG/ACT NA SUSP
2.00 | NASAL | Status: DC
Start: 2019-02-16 — End: 2019-02-16

## 2019-02-16 MED ORDER — CITALOPRAM HYDROBROMIDE 20 MG PO TABS
40.00 | ORAL_TABLET | ORAL | Status: DC
Start: 2019-02-17 — End: 2019-02-16

## 2019-02-16 MED ORDER — POLYETHYLENE GLYCOL 3350 17 GM/SCOOP PO POWD
17.00 | ORAL | Status: DC
Start: 2019-02-17 — End: 2019-02-16

## 2019-02-16 MED ORDER — ALPRAZOLAM 0.25 MG PO TABS
0.25 | ORAL_TABLET | ORAL | Status: DC
Start: ? — End: 2019-02-16

## 2019-02-16 MED ORDER — ALBUTEROL SULFATE HFA 108 (90 BASE) MCG/ACT IN AERS
4.00 | INHALATION_SPRAY | RESPIRATORY_TRACT | Status: DC
Start: ? — End: 2019-02-16

## 2019-02-16 MED ORDER — FLUTICASONE FUROATE-VILANTEROL 200-25 MCG/INH IN AEPB
1.00 | INHALATION_SPRAY | RESPIRATORY_TRACT | Status: DC
Start: 2019-02-16 — End: 2019-02-16

## 2019-02-17 ENCOUNTER — Telehealth: Payer: Self-pay | Admitting: Allergy and Immunology

## 2019-02-17 NOTE — Telephone Encounter (Signed)
Called pt she is doing better, had a lot of coughing and wheezing, called ambulance to transport her to hospital. She is on 40mg  of steroids right now for the next 3 days, the inhalers and nebulizer where not helping stabilizing her breathing. She is going to call end of week with an update and see about scheduling a visit with you for next week.

## 2019-02-17 NOTE — Telephone Encounter (Signed)
Patient called and wanted to inform Dr. Nunzio Cobbs that she was at [facility] for 24 hours due to an asthma attack.

## 2019-02-17 NOTE — Telephone Encounter (Signed)
Sounds good. If my schedule is full - have her see [facility] and I will check in with her during the visit. Thanks.

## 2019-02-20 ENCOUNTER — Ambulatory Visit
Admission: RE | Admit: 2019-02-20 | Discharge: 2019-02-20 | Disposition: A | Discharge status: Home / Self Care | Payer: Medicare (Managed Care) | Source: Ambulatory Visit | Attending: Internal Medicine | Admitting: Internal Medicine

## 2019-02-20 DIAGNOSIS — J45909 Unspecified asthma, uncomplicated: Secondary | ICD-10-CM

## 2019-02-20 DIAGNOSIS — J302 Other seasonal allergic rhinitis: Secondary | ICD-10-CM

## 2019-02-20 DIAGNOSIS — J984 Other disorders of lung: Secondary | ICD-10-CM

## 2019-02-20 NOTE — Telephone Encounter (Signed)
Spoke to Dr Mayford Knife and she did advise she approved [facility] Challenger for patient and will send in Rx to [facility] specialty pharmacy and once it arrives we will reach out to patient to schedule start appt

## 2019-02-26 ENCOUNTER — Ambulatory Visit: Payer: Medicare (Managed Care) | Admitting: Family Medicine

## 2019-02-26 ENCOUNTER — Encounter: Payer: Self-pay | Admitting: Family Medicine

## 2019-02-26 ENCOUNTER — Ambulatory Visit (INDEPENDENT_AMBULATORY_CARE_PROVIDER_SITE_OTHER): Payer: Medicare (Managed Care) | Admitting: Family Medicine

## 2019-02-26 ENCOUNTER — Telehealth: Payer: Self-pay | Admitting: Family Medicine

## 2019-02-26 ENCOUNTER — Other Ambulatory Visit: Payer: Self-pay

## 2019-02-26 VITALS — BP 130/66 | HR 67 | Temp 97.9°F | Resp 16

## 2019-02-26 DIAGNOSIS — R05 Cough: Secondary | ICD-10-CM | POA: Diagnosis not present

## 2019-02-26 DIAGNOSIS — K219 Gastro-esophageal reflux disease without esophagitis: Secondary | ICD-10-CM | POA: Diagnosis not present

## 2019-02-26 DIAGNOSIS — J4551 Severe persistent asthma with (acute) exacerbation: Secondary | ICD-10-CM | POA: Diagnosis not present

## 2019-02-26 DIAGNOSIS — R053 Chronic cough: Secondary | ICD-10-CM

## 2019-02-26 DIAGNOSIS — J3089 Other allergic rhinitis: Secondary | ICD-10-CM | POA: Diagnosis not present

## 2019-02-26 MED ORDER — FAMOTIDINE 20 MG PO TABS: 20.0000 mg | ORAL_TABLET | Freq: Two times a day (BID) | ORAL | 3 refills | Status: DC

## 2019-02-26 MED ORDER — BUDESONIDE 0.5 MG/2ML IN SUSP: 0.5000 mg | Freq: Two times a day (BID) | RESPIRATORY_TRACT | 5 refills | Status: DC

## 2019-02-26 NOTE — Telephone Encounter (Signed)
[facility]ried [facility] at number provided and lm for the pharmacy nurse to call us back here in clinic

## 2019-02-26 NOTE — Telephone Encounter (Signed)
When verifying the Patient's pharmacy she told me [facility] which I had of [place] which was in my computer.  [facility] are [facility] [place] [facility]. I didn't know there were 2 [place].So the medications were sent to the wrong pharmacy. [facility]. I Called the on call nurse who's name is Dedrica  said after hours the medications are then called out to the patients local pharmacy which is [facility] [place],which I called the medications in and spoke to slan. Dedrica the on call nurse stated if the patient didn't have a family member to get the medications and deliver to the patient's home she would go get the patient's medications an deliver to the patient's home since the patient needs to start her medications tonight. I then called the patient to let her know that her medications which is pepcid 20 mg one tablet twice daily for reflux and pulmicort 0.5 mg/47ml one vial in nebulizer twice daily to prevent coughing or wheezing. Patient's son will pick up the medications. I will call dedrica to let her know that themedications were called in and that her son will pick up the medications. Left a message for the on call nurse at [facility] that the medications were taken care of.[phone]

## 2019-02-26 NOTE — Patient Instructions (Addendum)
Asthma Begin prednisone 10 mg tablets. Take 2 tablets twice a day for 3 days, 2 tablets once a day for 3 days, 1 tablet once a day for 1 day, then stopContinue Advair 230-2 puffs twice a day with a spacer to prevent cough or wheeze Begin budesonide 0.5 mg twice a day via nebulizer to prevent cough or wheeze Continue Spiriva 2.5 mcg-2 puffs once a day to prevent cough or wheeze Continue montelukast 10 mg once a day to prevent cough or wheeze Return on Monday for Fasenra injection  Discussed with patient, with full understanding, to call the clinic or 911 with any worsening respiratory symptoms.   Allergic rhinitis Begin saline nasal rinses as needed for nasal symptoms. Use this before any medicated nasal sprays for best result Continue Flonase 2 sprays in each nostril once a day for a stuffy nose.  In the right nostril, point the applicator out toward the right ear. In the left nostril, point the applicator out toward the left ear For thick post nasal drainage, continue Mucinex [phone] mg twice a day Continue mold avoidance measures as listed below  Reflux Begin famotidine 20 mg twice a day for reflux Continue pantoprazole 40 mg once a day as previously prescribed Continue dietary and lifestyle modifications as listed below  Cough Continue with the plan above for asthma, allergic rhinitis, and reflux Delsym samples provided. Take 10 mL once every 12 hours as needed for cough  Call the clinic if this treatment plan is not working well for you  Follow up in 4 days or sooner if needed.   Lifestyle Changes for Controlling GERD When you have GERD, stomach acid feels as if it's backing up toward your mouth. Whether or not you take medication to control your GERD, your symptoms can often be improved with lifestyle changes.   Raise Your Head  Reflux is more likely to strike when you're lying down flat, because stomach fluid can  flow backward more easily. Raising the head of your bed 4-6  inches can help. To do this:  Slide blocks or books under the legs at the head of your bed. Or, place a wedge under  the mattress. Many foam stores can make a suitable wedge for you. The wedge  should run from your waist to the top of your head.  Don't just prop your head on several pillows. This increases pressure on your  stomach. It can make GERD worse.  Watch Your Eating Habits Certain foods may increase the acid in your stomach or relax the lower esophageal sphincter, making GERD more likely. It's best to avoid the following:  Coffee, tea, and carbonated drinks (with and without caffeine)  Fatty, fried, or spicy food  Mint, chocolate, onions, and tomatoes  Any other foods that seem to irritate your stomach or cause you pain  Relieve the Pressure  Eat smaller meals, even if you have to eat more often.  Don't lie down right after you eat. Wait a few hours for your stomach to empty.  Avoid tight belts and tight-fitting clothes.  Lose excess weight.  Tobacco and Alcohol  Avoid smoking tobacco and drinking alcohol. They can make GERD symptoms worse.  Control of Mold Allergen Mold and fungi can grow on a variety of surfaces provided certain temperature and moisture conditions exist.  Outdoor molds grow on plants, decaying vegetation and soil.  The major outdoor mold, Alternaria and Cladosporium, are found in very high numbers during hot and dry conditions.  Generally, a  late Summer - Fall peak is seen for common outdoor fungal spores.  Rain will temporarily lower outdoor mold spore count, but counts rise rapidly when the rainy period ends.  The most important indoor molds are Aspergillus and Penicillium.  Dark, humid and poorly ventilated basements are ideal sites for mold growth.  The next most common sites of mold growth are the bathroom and the kitchen.  Outdoor [employer] 1. Use air conditioning and keep windows closed 2. Avoid exposure to decaying  vegetation. 3. Avoid leaf raking. 4. Avoid grain handling. 5. Consider wearing a face mask if working in moldy areas.  Indoor Mold Control 1. Maintain humidity below 50%. 2. Clean washable surfaces with 5% bleach solution. 3. Remove sources e.g. Contaminated carpets.

## 2019-02-26 NOTE — Telephone Encounter (Signed)
PT called because the medications from todays visit were sent to the wrong pharmacy. Pulmicort and Pepcid need to go to [facility], not [facility]. Please call [facility] and ask for the clinic to submit meds. Ph [phone]

## 2019-02-26 NOTE — Telephone Encounter (Signed)
Noted! Thank you

## 2019-02-26 NOTE — Progress Notes (Addendum)
[address] [place] [address] Dept: [phone]  FOLLOW UP NOTE  Patient ID: Elaine Lee, female    DOB: 11-13-1945  Age: 74 y.o. MRN: [id number] Date of Office Visit: 02/26/2019  Assessment  Chief Complaint: Allergic Rhinitis , Asthma, and Wheezing  HPI Elaine Lee is a 74 year old female who presents to the clinic today to receive a Fasenra injection, however, she is in asthma exacerbation.  She was last seen in this clinic on 01/22/2019 for evaluation of asthma, allergic rhinitis, and persistent cough.  Her concurrent pertinent medical conditions include COPD, atrial fibrillation, congestive heart failure, and reflux.  She reports her asthma has not been well controlled with a recent emergency department admission resulting in a prednisone taper.  She she reports shortness of breath at rest and activity, wheeze consistently, and cough producing thick yellow mucus that began in November and has worsened over the last several months.  She continues montelukast 10 mg, Advair 230-2 puffs twice a day with a spacer, Spiriva 2.5 mg and albuterol through her nebulizer 4-5 times a day with moderate relief of symptoms, allergic rhinitis is reported as not well controlled despite pantoprazole 40 mg once a day.  Her current medications are listed in the chart.   Drug Allergies:  Allergies  Allergen Reactions  . Other Shortness Of Breath and Other (See Comments)    ROSEMARY AND HEAVY SCENTED PERFUMES, DETERGENTS, ETC  . Tape Itching, Rash and Other (See Comments)    Burns and blisters USE PAPER TAPE  . Trazodone And Nefazodone Shortness Of Breath and Swelling  . Celebrex [Celecoxib] Other (See Comments)    HARD TIME HEARING, LIKE BEING UNDERWATER  . Latex Hives, Itching and Rash  . Mysoline [Primidone] Other (See Comments)    unknown  . Niacin And Related Hives and Swelling  . Sulfa Antibiotics Other (See Comments)    TONGUE BLISTERS    . Aspirin Other (See  Comments)    GI PROBLEMS AND PAIN    Physical Exam: BP 130/66 (BP Location: Left Arm, Patient Position: Sitting, Cuff Size: Large)   Pulse 67   Temp 97.9 F (36.6 C) (Oral)   Resp 16   SpO2 94%    Physical Exam Vitals reviewed.  Constitutional:      Appearance: Normal appearance.  HENT:     Head: Normocephalic and atraumatic.     Right Ear: Tympanic membrane normal.     Left Ear: Tympanic membrane normal.     Nose:     Comments: Bilateral nares slightly erythematous with clear nasal drainage noted.  Pharynx slightly erythematous with no exudate.  Ears normal.  Eyes normal. Eyes:     Conjunctiva/sclera: Conjunctivae normal.  Cardiovascular:     Rate and Rhythm: Normal rate. Rhythm irregular.     Pulses: Normal pulses.  Pulmonary:     Effort: Pulmonary effort is normal.     Comments: Bilateral scattered inspiratory and expiratory wheeze which did not change post bronchodilator therapy Musculoskeletal:        General: Normal range of motion.     Cervical back: Normal range of motion and neck supple.  Skin:    General: Skin is warm and dry.  Neurological:     Mental Status: She is alert and oriented to person, place, and time.  Psychiatric:        Mood and Affect: Mood normal.        Behavior: Behavior normal.  Thought Content: Thought content normal.        Judgment: Judgment normal.     Diagnostics: FVC 0.83, FEV1 0.59.  Predicted FVC 2.93, predicted FEV1 2.20.  Spirometry indicates severe restriction.  Postbronchodilator therapy FVC 1.12, FEV1 0.78.  Postbronchodilator therapy indicates severe restriction with moderate improvement.  There was an increase in FEV1 of 32% and an increase in FVC of 35%.  Assessment and Plan: 1. Severe persistent asthma with acute exacerbation   2. Gastroesophageal reflux disease, unspecified whether esophagitis present   3. Allergic rhinitis with a predominantly nonallergic component   4. Cough, persistent     Meds ordered this  encounter  Medications  . budesonide (PULMICORT) 0.5 MG/2ML nebulizer solution    Sig: Take 2 mLs (0.5 mg total) by nebulization in the morning and at bedtime.    Dispense:  120 mL    Refill:  5  . famotidine (PEPCID) 20 MG tablet    Sig: Take 1 tablet (20 mg total) by mouth 2 (two) times daily.    Dispense:  60 tablet    Refill:  3    Patient Instructions  Asthma Begin prednisone 10 mg tablets. Take 2 tablets twice a day for 3 days, 2 tablets once a day for 3 days, 1 tablet once a day for 1 day, then stopContinue Advair 230-2 puffs twice a day with a spacer to prevent cough or wheeze Begin budesonide 0.5 mg twice a day via nebulizer to prevent cough or wheeze Continue Spiriva 2.5 mcg-2 puffs once a day to prevent cough or wheeze Continue montelukast 10 mg once a day to prevent cough or wheeze Return on Monday for Fasenra injection  Discussed with patient, with full understanding, to call the clinic or 911 with any worsening respiratory symptoms.   Allergic rhinitis Begin saline nasal rinses as needed for nasal symptoms. Use this before any medicated nasal sprays for best result Continue Flonase 2 sprays in each nostril once a day for a stuffy nose.  In the right nostril, point the applicator out toward the right ear. In the left nostril, point the applicator out toward the left ear For thick post nasal drainage, continue Mucinex [phone] mg twice a day Continue mold avoidance measures as listed below  Reflux Begin famotidine 20 mg twice a day for reflux Continue pantoprazole 40 mg once a day as previously prescribed Continue dietary and lifestyle modifications as listed below  Cough Continue with the plan above for asthma, allergic rhinitis, and reflux Delsym samples provided. Take 10 mL once every 12 hours as needed for cough  Call the clinic if this treatment plan is not working well for you  Follow up in 4 days or sooner if needed.   Return in about 4 days (around  03/02/2019), or if symptoms worsen or fail to improve.    Thank you for the opportunity to care for this patient.  Please do not hesitate to contact me with questions.  Gareth Morgan, FNP [facility]  ________________________________________________  I have provided oversight concerning [facility] evaluation and treatment of this patient's health issues addressed during today's encounter.  I agree with the assessment and therapeutic plan as outlined in the note.   Signed,   R Edgar Frisk, MD

## 2019-02-27 NOTE — Telephone Encounter (Signed)
Received a phone call from pharmacist at [phone]. Looks like there has been some confusion with where to send patient's medications.  Apparently the [facility] can't just transfer prescriptions from one of their pharmacy to the other one.   Please call patient and/or pharmacist to see what's going on.

## 2019-03-02 ENCOUNTER — Other Ambulatory Visit: Payer: Self-pay

## 2019-03-02 ENCOUNTER — Ambulatory Visit (INDEPENDENT_AMBULATORY_CARE_PROVIDER_SITE_OTHER): Payer: Medicare (Managed Care)

## 2019-03-02 DIAGNOSIS — J4551 Severe persistent asthma with (acute) exacerbation: Secondary | ICD-10-CM

## 2019-03-02 DIAGNOSIS — J4550 Severe persistent asthma, uncomplicated: Secondary | ICD-10-CM | POA: Diagnosis not present

## 2019-03-02 MED ORDER — BENRALIZUMAB 30 MG/ML ~~LOC~~ SOSY
30.0000 mg | PREFILLED_SYRINGE | SUBCUTANEOUS | Status: DC
  Administered 2019-03-02 – 2020-03-18 (×8): 30 mg via SUBCUTANEOUS

## 2019-03-02 NOTE — Telephone Encounter (Signed)
Spoke to [place] from [facility] [employer]. I told her how I called the [facility] [place] to try to get the address for the [facility] [place] because I couldn't get it in our computer system.The person at [facility] stated they are not affiliated with the [facility] [place]. They are two different agencies and not associated at all. Annice Pih informed me that the only way to get medications or refills is by calling this number [phone] or fax it in. Annice Pih stated that they take the prescription request and then prescriptions are delivered or mailed to the pt.'s home. Annice Pih verbally took my order today for the epipen 0.3mg  use as directed for severe allergic reactions. Dispense 2 epipens 0.3 mg.

## 2019-03-02 NOTE — Progress Notes (Addendum)
Immunotherapy   Patient Details  Name: Elaine Lee MRN: [id number] Date of Birth: [birth year]  03/02/2019  [name] came in to start 30 mg/mL Fasenra injection, she has a small wheeze and Ann listened to her and stated it was okay for her to get her shot today. Following schedule: Fasenra Frequency:Every 4 weeks Epi-Pen:Yes  Consent signed and patient instructions given.   [name] 03/02/2019, 11:27 AM

## 2019-03-02 NOTE — Telephone Encounter (Signed)
Spoke to [place] at [facility] [place]. Told her I called [facility] to see if I could get the address [facility] so I could put it into our computer system. The person told me that the [facility]  [place] [facility] are two different agencies. Annice Pih told me that any medications that need to be called in for  the patient is to be verbally called in at this number [phone] or faxed in. The medications will be delivered or mailed to the pt. Annice Pih verbally took my order today per Thermon Leyland, FNP for epipen 0.3mg   2 devices for pt. Elaine Lee who started her 1st Fasenra injection.

## 2019-03-02 NOTE — Addendum Note (Signed)
Addended by: [name] on: 03/02/2019 12:18 PM   Modules accepted: Orders

## 2019-03-02 NOTE — Telephone Encounter (Signed)
Patient started Harrington Challenger today

## 2019-03-02 NOTE — Telephone Encounter (Signed)
Left message for Annice Pih to call our office regarding [place].

## 2019-03-02 NOTE — Telephone Encounter (Signed)
Left message for Annice Pih at [facility] to return my call regarding the correct wording to put in for the pharmacy to show up in our computer system.

## 2019-03-03 ENCOUNTER — Ambulatory Visit: Payer: Self-pay | Admitting: Family Medicine

## 2019-03-10 ENCOUNTER — Other Ambulatory Visit: Payer: Self-pay

## 2019-03-10 ENCOUNTER — Encounter: Payer: Self-pay | Admitting: Critical Care Medicine

## 2019-03-10 ENCOUNTER — Ambulatory Visit (INDEPENDENT_AMBULATORY_CARE_PROVIDER_SITE_OTHER): Payer: Medicare (Managed Care) | Admitting: Critical Care Medicine

## 2019-03-10 VITALS — BP 110/60 | HR 84 | Temp 97.9°F | Ht 65.0 in | Wt 267.0 lb

## 2019-03-10 DIAGNOSIS — J3089 Other allergic rhinitis: Secondary | ICD-10-CM

## 2019-03-10 DIAGNOSIS — J4550 Severe persistent asthma, uncomplicated: Secondary | ICD-10-CM | POA: Diagnosis not present

## 2019-03-10 MED ORDER — AZELASTINE HCL 0.1 % NA SOLN: 2.0000 | Freq: Two times a day (BID) | NASAL | 12 refills | Status: DC

## 2019-03-10 MED ORDER — PREDNISONE 10 MG PO TABS: 10.0000 mg | ORAL_TABLET | Freq: Every day | ORAL | 0 refills | Status: DC

## 2019-03-10 NOTE — Patient Instructions (Addendum)
Thank you for visiting Dr. Chestine Spore at [facility]. We recommend the following:  Keep all medications the same. Adding: Azelastine nasal spray -- 2 sprays twice daily. Use in addition to steroid spray. Prednisone 10mg  daily until follow up.  Meds ordered this encounter  Medications  . azelastine (ASTELIN) 0.1 % nasal spray    Sig: Place 2 sprays into both nostrils 2 (two) times daily. Use in each nostril as directed    Dispense:  30 mL    Refill:  12  . predniSONE (DELTASONE) 10 MG tablet    Sig: Take 1 tablet (10 mg total) by mouth daily with breakfast.    Dispense:  20 tablet    Refill:  0    Return in about 10 days (around 03/20/2019).    Please do your part to reduce the spread of COVID-19.

## 2019-03-10 NOTE — Progress Notes (Signed)
Synopsis: Referred in September 2016 for asthma, OHS, OSA by Elaine Adie, MD.  Previously patient of Elaine Lee.  Subjective:   PATIENT ID: Elaine Lee GENDER: female DOB: 04-01-1945, MRN: [id number]  Chief Complaint  Patient presents with  . Follow-up    Patient is here for shortness of breath and asthma. Patient was in the ED on 2/14. Patient was started on Fasenra (benralizumab) and got it last week for the first time. Patient is having shortness of breath all the time. Patient has a cough and is constantly clearing throat with yellow sputum.     Elaine Lee is a 74 y/o woman with a history of severe persistent asthma.  She has required hospitalizations 3 times in the last several months, most recently overnight on February 15, 2019.  She was discharged on prednisone.  She followed up with her allergist, Dr. Leanord Hawking, and her initiation of Berna Bue was delayed due to asthma exacerbation.  At that time she was started on prednisone and inhaled budesonide in addition to high-dose Advair and Spiriva.  She continues to take montelukast daily, Flonase twice daily, Mucinex twice daily, and Claritin once daily.  She has not been using her flutter valve.  She is using albuterol nebs 4 times daily, but no longer using saline sinus rinses.  She last finished prednisone on 03/06/2019, with no change in symptoms since then.  She continues to have frequent coughing with occasional yellow sputum production.  She is constantly short of breath, worse with exertion.  She has frequent wheezing.  She has walked with her pulse oximeter at home, dropped as low as 86%, but quickly recovers into the 90s.  She was able to receive her first dose of Fasenra on 03/02/2019 without problems.  Her allergist has escalated her GERD regimen to include both a PPI and famotidine.     OV12/01/2018: Elaine Lee is a 74 year old woman with a history of allergies, asthma and OSA on CPAP.  She formally had OHS  requiring home oxygen, but has not needed for the past 2 years after losing weight.  Her asthma has been worse recently.  She has been hospitalized 2 times in the past few months at [facility].  She has been following up with her allergist Elaine Lee, who is planning on starting her on an anti-IL-5 biologic in the future.  He added Spiriva to her regimen at her last visit, which she thinks is helping.  She remains on Advair 250-50, montelukast, Claritin, and Flonase nasal spray.  She has been using her nebulizer less in the last few weeks since recovering from her exacerbation-twice per day in the morning and before bed.  Due to nocturnal symptoms she has not been using her CPAP at night.  Her symptoms are exacerbated by strong smells, and when her allergies are worse, commonly this time of year.  Wheezing is her main complaint.  Her nonproductive cough has caused her to have rib pain, but overall her cough is improving since hospitalization.  She has 1 pet dog, but denies a history of allergies to dogs.  She is concerned that she has mold in a closet from a previous leak in her roof that she has not been able to have fixed yet.  Breathing is a contributor to her activity limitation, but she also attributes hip and back pain.   She has a history of chronic GERD that is uncontrolled.  She is taking Pepcid twice daily and has never  been on a PPI.  She has frequent belching and throat clearing.  She avoids spicy foods, eating mostly bland foods, which helps.  She does not drink caffeine, but frequently drinks carbonated beverages and eats chocolate.  She has struggled more with her depression this year.  [facility], but has been unable to go since it has been closed due to COVID-19.  Her husband passed away suddenly in the spring.  She now lives with her son, but has stress due to his history of drug use.  While she was recently hospitalized she had a CT scan which  demonstrated a pleural effusion.     Past Medical History:  Diagnosis Date  . A-fib (HCC)   . Asthma   . CHF (congestive heart failure) (HCC)   . Chronic edema   . CKD (chronic kidney disease), stage III   . COPD (chronic obstructive pulmonary disease) (HCC)   . Degenerative lumbar disc   . Depression   . Diabetes mellitus without complication (HCC)   . Diabetic neuropathy (HCC)   . GERD (gastroesophageal reflux disease)   . Gout   . Hearing loss   . Hypercholesteremia   . Hypertension   . Insomnia   . Obstructive sleep apnea on CPAP   . Osteoarthritis    knees and shoulders  . Renal disorder   . Urinary frequency      Family History  Problem Relation Age of Onset  . Allergies Mother   . Rheumatologic disease Mother   . Cancer Mother        leukemia  . Allergies Father   . Asthma Father   . Heart disease Father   . Rheumatologic disease Father   . Cancer Father        leukemia  . Allergies Daughter   . Cancer Sister   . Breast cancer Maternal Aunt   . Breast cancer Paternal Aunt   . Breast cancer Maternal Aunt   . Allergic rhinitis Neg Hx   . Eczema Neg Hx   . Urticaria Neg Hx      Past Surgical History:  Procedure Laterality Date  . ABDOMINAL HYSTERECTOMY    . KNEE ARTHROPLASTY Right 12/05/2017   Procedure: COMPUTER ASSISTED TOTAL KNEE ARTHROPLASTY;  Surgeon: Samson Frederic, MD;  Location: [facility];  Service: Orthopedics;  Laterality: Right;  . ROTATOR CUFF REPAIR  1980   x3  . TONSILLECTOMY      Social History   Socioeconomic History  . Marital status: Married    Spouse name: Not on file  . Number of children: Not on file  . Years of education: Not on file  . Highest education level: Not on file  Occupational History  . Not on file  Tobacco Use  . Smoking status: Never Smoker  . Smokeless tobacco: Never Used  Substance and Sexual Activity  . Alcohol use: No    Alcohol/week: 0.0 standard drinks  . Drug use: Never  . Sexual activity: Not on  file  Other Topics Concern  . Not on file  Social History Narrative  . Not on file   Social Determinants of Health   Financial Resource Strain:   . Difficulty of Paying Living Expenses: Not on file  Food Insecurity:   . Worried About Programme researcher, broadcasting/film/video in the Last Year: Not on file  . Ran Out of Food in the Last Year: Not on file  Transportation Needs:   .  Lack of Transportation (Medical): Not on file  . Lack of Transportation (Non-Medical): Not on file  Physical Activity:   . Days of Exercise per Week: Not on file  . Minutes of Exercise per Session: Not on file  Stress:   . Feeling of Stress : Not on file  Social Connections:   . Frequency of Communication with Friends and Family: Not on file  . Frequency of Social Gatherings with Friends and Family: Not on file  . Attends Religious Services: Not on file  . Active Member of Clubs or Organizations: Not on file  . Attends BankerClub or Organization Meetings: Not on file  . Marital Status: Not on file  Intimate Partner Violence:   . Fear of Current or Ex-Partner: Not on file  . Emotionally Abused: Not on file  . Physically Abused: Not on file  . Sexually Abused: Not on file     Allergies  Allergen Reactions  . Other Shortness Of Breath and Other (See Comments)    ROSEMARY AND HEAVY SCENTED PERFUMES, DETERGENTS, ETC  . Tape Itching, Rash and Other (See Comments)    Burns and blisters USE PAPER TAPE  . Trazodone And Nefazodone Shortness Of Breath and Swelling  . Celebrex [Celecoxib] Other (See Comments)    HARD TIME HEARING, LIKE BEING UNDERWATER  . Latex Hives, Itching and Rash  . Mysoline [Primidone] Other (See Comments)    unknown  . Niacin And Related Hives and Swelling  . Sulfa Antibiotics Other (See Comments)    TONGUE BLISTERS    . Aspirin Other (See Comments)    GI PROBLEMS AND PAIN     Immunization History  Administered Date(s) Administered  . Fluad Quad(high Dose 65+) 04/02/2018, 12/02/2018  .  Influenza,inj,Quad PF,6+ Mos 10/04/2014    Outpatient Medications Prior to Visit  Medication Sig Dispense Refill  . acetaminophen (TYLENOL) 500 MG tablet Take 500 mg by mouth every 6 (six) hours as needed.    Marland Kitchen. albuterol (ACCUNEB) 0.63 MG/3ML nebulizer solution Take 1 ampule by nebulization 3 (three) times daily as needed for wheezing or shortness of breath.    Marland Kitchen. albuterol (VENTOLIN HFA) 108 (90 Base) MCG/ACT inhaler Inhale 1-2 puffs into the lungs every 6 (six) hours as needed for wheezing or shortness of breath.    Marland Kitchen. atorvastatin (LIPITOR) 10 MG tablet Take 10 mg by mouth at bedtime.     . Azelastine-Fluticasone 137-50 MCG/ACT SUSP Place 1-2 sprays into the nose 2 (two) times daily as needed. 23 g 5  . budesonide (PULMICORT) 0.5 MG/2ML nebulizer solution Take 2 mLs (0.5 mg total) by nebulization in the morning and at bedtime. 120 mL 5  . Cholecalciferol (VITAMIN D3) 125 MCG (5000 UT) CAPS Take 5,000 Units by mouth daily.    . citalopram (CELEXA) 20 MG tablet Take 20 mg by mouth daily.    Marland Kitchen. diltiazem (CARDIZEM CD) 240 MG 24 hr capsule Take 240 mg by mouth daily.    Marland Kitchen. diltiazem (TIAZAC) 240 MG 24 hr capsule Take by mouth.    . famotidine (PEPCID) 20 MG tablet Take 1 tablet (20 mg total) by mouth 2 (two) times daily. 60 tablet 3  . fluticasone-salmeterol (ADVAIR HFA) 230-21 MCG/ACT inhaler Two puffs with spacer twice a day 12 g 5  . furosemide (LASIX) 40 MG tablet Take 40 mg by mouth daily.     Marland Kitchen. gabapentin (NEURONTIN) 600 MG tablet Take 1 tablet (600 mg total) by mouth 2 (two) times daily. (Patient taking differently: Take  600-1,200 mg by mouth See admin instructions. Take 600 mg by mouth in the morning and take 1200 mg by mouth at bedtime) 60 tablet 2  . guaiFENesin (MUCINEX) 600 MG 12 hr tablet Take 600 mg by mouth 2 (two) times daily.    . Insulin Glargine (BASAGLAR KWIKPEN) 100 UNIT/ML SOPN Inject 6 Units into the skin daily.    Marland Kitchen liraglutide (VICTOZA) 18 MG/3ML SOPN Inject 1.2 mg into the  skin daily.     Marland Kitchen loratadine (CLARITIN) 10 MG tablet Take 10 mg by mouth every morning.    Marland Kitchen losartan (COZAAR) 100 MG tablet Take 100 mg by mouth daily.    . Menthol, Topical Analgesic, (BIOFREEZE) 4 % GEL Apply topically. Apply topically as needed to painful joints    . mirabegron ER (MYRBETRIQ) 25 MG TB24 tablet Take 25 mg by mouth daily.    . montelukast (SINGULAIR) 10 MG tablet Take 1 tablet (10 mg total) by mouth at bedtime. 30 tablet 5  . Mouthwashes (BIOTENE DRY MOUTH GENTLE) LIQD Use as directed in the mouth or throat. 1 cap full Q2 hr PRN for dry mouth    . nystatin (NYSTATIN) powder Apply 1 g topically 4 (four) times daily as needed (for skin folds).     . pantoprazole (PROTONIX) 40 MG tablet Take 1 tablet (40 mg total) by mouth daily. 30 tablet 11  . polyethylene glycol (MIRALAX / GLYCOLAX) packet Take 17 g by mouth daily. (Patient taking differently: Take 17 g by mouth daily as needed for moderate constipation. ) 14 each 0  . rivaroxaban (XARELTO) 20 MG TABS tablet Take 20 mg by mouth daily with supper.    Marland Kitchen rOPINIRole (REQUIP) 0.5 MG tablet Take 1 mg by mouth at bedtime.    . senna (SENOKOT) 8.6 MG TABS tablet Take 2 tablets (17.2 mg total) by mouth at bedtime. 120 each 0  . simethicone (MYLICON) 125 MG chewable tablet Chew 125 mg by mouth every 6 (six) hours as needed for flatulence.    Marland Kitchen tetrahydrozoline (VISINE) 0.05 % ophthalmic solution Place 1 drop into both eyes 4 (four) times daily as needed (for eye irritation).     . Tiotropium Bromide Monohydrate (SPIRIVA RESPIMAT) 2.5 MCG/ACT AERS Inhale 2 puffs into the lungs 1 day or 1 dose. 4 g 5  . traMADol (ULTRAM) 50 MG tablet Take 50 mg by mouth every 6 (six) hours as needed.     Facility-Administered Medications Prior to Visit  Medication Dose Route Frequency Provider Last Rate Last Admin  . Benralizumab SOSY 30 mg  30 mg Subcutaneous Q14 Days Bobbitt, Heywood Iles, MD   30 mg at 03/02/19 1118    Review of Systems    Constitutional: Negative for chills, fever and weight loss.  HENT: Positive for congestion.        Postnasal drip  Eyes: Negative.   Respiratory: Positive for cough, shortness of breath and wheezing. Negative for sputum production.   Cardiovascular: Negative for chest pain and leg swelling.  Gastrointestinal: Positive for heartburn. Negative for abdominal pain, diarrhea, nausea and vomiting.  Genitourinary: Negative.   Musculoskeletal: Negative.   Skin: Negative for rash.  Neurological: Negative for weakness and headaches.  Endo/Heme/Allergies: Positive for environmental allergies.  Psychiatric/Behavioral: Positive for depression. The patient is nervous/anxious.      Objective:   Vitals:   03/10/19 1207  BP: 110/60  Pulse: 84  Temp: 97.9 F (36.6 C)  TempSrc: Temporal  SpO2: 91%  Weight: 267 lb (121.1  kg)  Height: 5\' 5"  (1.651 m)   91% on   RA BMI Readings from Last 3 Encounters:  03/10/19 44.43 kg/m  12/02/18 45.33 kg/m  11/06/18 44.75 kg/m   Wt Readings from Last 3 Encounters:  03/10/19 267 lb (121.1 kg)  12/02/18 268 lb 12.8 oz (121.9 kg)  11/06/18 264 lb 12.8 oz (120.1 kg)    Physical Exam Vitals reviewed.  Constitutional:      General: She is not in acute distress.    Appearance: She is obese. She is not ill-appearing.  HENT:     Head: Normocephalic and atraumatic.  Eyes:     General: No scleral icterus. Cardiovascular:     Rate and Rhythm: Normal rate and regular rhythm.  Pulmonary:     Comments: Audible wheezing across the room, occasional paroxysms of coughing.  Diffuse bilateral wheezing with prolonged exhalation.  No conversational dyspnea. Abdominal:     General: There is no distension.     Palpations: Abdomen is soft.  Musculoskeletal:        General: No deformity.     Cervical back: Neck supple.  Lymphadenopathy:     Cervical: No cervical adenopathy.  Skin:    General: Skin is warm and dry.     Findings: No rash.  Neurological:      General: No focal deficit present.     Mental Status: She is alert.     Coordination: Coordination normal.     Comments: Ambulates with walker  Psychiatric:        Mood and Affect: Mood normal.        Behavior: Behavior normal.      CBC    Component Value Date/Time   WBC 9.2 11/07/2018 1655   WBC 10.8 (H) 12/08/2017 0439   RBC 3.92 11/07/2018 1655   RBC 3.08 (L) 12/08/2017 0439   HGB 11.2 11/07/2018 1655   HCT 33.7 (L) 11/07/2018 1655   PLT 209 11/07/2018 1655   MCV 86 11/07/2018 1655   MCH 28.6 11/07/2018 1655   MCH 28.2 12/08/2017 0439   MCHC 33.2 11/07/2018 1655   MCHC 31.4 12/08/2017 0439   RDW 13.1 11/07/2018 1655   LYMPHSABS 1.8 11/07/2018 1655   MONOABS 0.4 06/26/2017 0614   EOSABS 0.5 (H) 11/07/2018 1655   BASOSABS 0.0 11/07/2018 1655    CHEMISTRY No results for input(s): NA, K, CL, CO2, GLUCOSE, BUN, CREATININE, CALCIUM, MG, PHOS in the last 168 hours. CrCl cannot be calculated (Patient's most recent lab result is older than the maximum 21 days allowed.).  Alpha-1 antitrypsin phenotype MM, 146 Alpha gal panel negative IgE 247 Allergy panel negative (per Dr. 13/06/2018 notes, skin testing was positive for major mold mix #4)  Chest Imaging- films reviewed: CXR, 2 view 11/22/2017-prominent hila bilaterally, airway thickening.  Loss of AP window on the left.  CT chest 02/20/2019- upper lobe predominant air trapping, RML nodule suspicious for mucus plugging.   Pulmonary Functions Testing Results: No flowsheet data found.  11/06/2018 office spirometry: FVC 1.64 (56%)--> 1.48 (51%) , -10% FEV1 1.2 (55%) --> 1.04 (47%), -13% Ratio 0.73--> 0.70 Expiratory loop consistent with obstruction   Echocardiogram 04/06/2016: LVEF 60 to 65%, indeterminate diastolic function.  Mildly dilated LA, normal RV, mildly dilated RA.  Enlarged IVC with reduced changes with respiration.  Mild TR, otherwise normal valves.      Assessment & Plan:     ICD-10-CM   1. Severe  persistent asthma without complication  J45.50   2. Allergic  rhinitis with a predominantly nonallergic component  J30.89     Severe persistent asthma, uncontrolled. -Start prednisone 10 mg daily -Continue high-dose Advair and budesonide nebs -Continue Spiriva -Continue montelukast and Claritin daily -Continue Flonase-using 2 sprays bilaterally twice daily -Adding azelastine nasal spray 2 sprays bilaterally twice daily -Prescriptions called to [phone], [facility] -Agree with Fasenra infusions. Appreciate Dr. Sheran Fava assistance. -Continue aggressive GERD management -Up-to-date on seasonal flu shot.  Recommend Covid shot when it is available. -Walked in the office without desaturations.  Does not qualify for home oxygen.   Abnormal chest x-ray in 2019 with increased left hilar prominence.  Reported history of recent pleural effusion. -No additional follow-up needed based on CT results  RTC in 1.5 weeks.  50 minutes spent on this encounter including review, time spent face-to-face with patient, and charting.    Current Outpatient Medications:  .  acetaminophen (TYLENOL) 500 MG tablet, Take 500 mg by mouth every 6 (six) hours as needed., Disp: , Rfl:  .  albuterol (ACCUNEB) 0.63 MG/3ML nebulizer solution, Take 1 ampule by nebulization 3 (three) times daily as needed for wheezing or shortness of breath., Disp: , Rfl:  .  albuterol (VENTOLIN HFA) 108 (90 Base) MCG/ACT inhaler, Inhale 1-2 puffs into the lungs every 6 (six) hours as needed for wheezing or shortness of breath., Disp: , Rfl:  .  atorvastatin (LIPITOR) 10 MG tablet, Take 10 mg by mouth at bedtime. , Disp: , Rfl:  .  Azelastine-Fluticasone 137-50 MCG/ACT SUSP, Place 1-2 sprays into the nose 2 (two) times daily as needed., Disp: 23 g, Rfl: 5 .  budesonide (PULMICORT) 0.5 MG/2ML nebulizer solution, Take 2 mLs (0.5 mg total) by nebulization in the morning and at bedtime., Disp: 120 mL, Rfl: 5 .  Cholecalciferol  (VITAMIN D3) 125 MCG (5000 UT) CAPS, Take 5,000 Units by mouth daily., Disp: , Rfl:  .  citalopram (CELEXA) 20 MG tablet, Take 20 mg by mouth daily., Disp: , Rfl:  .  diltiazem (CARDIZEM CD) 240 MG 24 hr capsule, Take 240 mg by mouth daily., Disp: , Rfl:  .  diltiazem (TIAZAC) 240 MG 24 hr capsule, Take by mouth., Disp: , Rfl:  .  famotidine (PEPCID) 20 MG tablet, Take 1 tablet (20 mg total) by mouth 2 (two) times daily., Disp: 60 tablet, Rfl: 3 .  fluticasone-salmeterol (ADVAIR HFA) 230-21 MCG/ACT inhaler, Two puffs with spacer twice a day, Disp: 12 g, Rfl: 5 .  furosemide (LASIX) 40 MG tablet, Take 40 mg by mouth daily. , Disp: , Rfl:  .  gabapentin (NEURONTIN) 600 MG tablet, Take 1 tablet (600 mg total) by mouth 2 (two) times daily. (Patient taking differently: Take 600-1,200 mg by mouth See admin instructions. Take 600 mg by mouth in the morning and take 1200 mg by mouth at bedtime), Disp: 60 tablet, Rfl: 2 .  guaiFENesin (MUCINEX) 600 MG 12 hr tablet, Take 600 mg by mouth 2 (two) times daily., Disp: , Rfl:  .  Insulin Glargine (BASAGLAR KWIKPEN) 100 UNIT/ML SOPN, Inject 6 Units into the skin daily., Disp: , Rfl:  .  liraglutide (VICTOZA) 18 MG/3ML SOPN, Inject 1.2 mg into the skin daily. , Disp: , Rfl:  .  loratadine (CLARITIN) 10 MG tablet, Take 10 mg by mouth every morning., Disp: , Rfl:  .  losartan (COZAAR) 100 MG tablet, Take 100 mg by mouth daily., Disp: , Rfl:  .  Menthol, Topical Analgesic, (BIOFREEZE) 4 % GEL, Apply topically. Apply topically as  needed to painful joints, Disp: , Rfl:  .  mirabegron ER (MYRBETRIQ) 25 MG TB24 tablet, Take 25 mg by mouth daily., Disp: , Rfl:  .  montelukast (SINGULAIR) 10 MG tablet, Take 1 tablet (10 mg total) by mouth at bedtime., Disp: 30 tablet, Rfl: 5 .  Mouthwashes (BIOTENE DRY MOUTH GENTLE) LIQD, Use as directed in the mouth or throat. 1 cap full Q2 hr PRN for dry mouth, Disp: , Rfl:  .  nystatin (NYSTATIN) powder, Apply 1 g topically 4 (four) times  daily as needed (for skin folds). , Disp: , Rfl:  .  pantoprazole (PROTONIX) 40 MG tablet, Take 1 tablet (40 mg total) by mouth daily., Disp: 30 tablet, Rfl: 11 .  polyethylene glycol (MIRALAX / GLYCOLAX) packet, Take 17 g by mouth daily. (Patient taking differently: Take 17 g by mouth daily as needed for moderate constipation. ), Disp: 14 each, Rfl: 0 .  rivaroxaban (XARELTO) 20 MG TABS tablet, Take 20 mg by mouth daily with supper., Disp: , Rfl:  .  rOPINIRole (REQUIP) 0.5 MG tablet, Take 1 mg by mouth at bedtime., Disp: , Rfl:  .  senna (SENOKOT) 8.6 MG TABS tablet, Take 2 tablets (17.2 mg total) by mouth at bedtime., Disp: 120 each, Rfl: 0 .  simethicone (MYLICON) 125 MG chewable tablet, Chew 125 mg by mouth every 6 (six) hours as needed for flatulence., Disp: , Rfl:  .  tetrahydrozoline (VISINE) 0.05 % ophthalmic solution, Place 1 drop into both eyes 4 (four) times daily as needed (for eye irritation). , Disp: , Rfl:  .  Tiotropium Bromide Monohydrate (SPIRIVA RESPIMAT) 2.5 MCG/ACT AERS, Inhale 2 puffs into the lungs 1 day or 1 dose., Disp: 4 g, Rfl: 5 .  traMADol (ULTRAM) 50 MG tablet, Take 50 mg by mouth every 6 (six) hours as needed., Disp: , Rfl:  .  azelastine (ASTELIN) 0.1 % nasal spray, Place 2 sprays into both nostrils 2 (two) times daily. Use in each nostril as directed, Disp: 30 mL, Rfl: 12 .  predniSONE (DELTASONE) 10 MG tablet, Take 1 tablet (10 mg total) by mouth daily with breakfast., Disp: 20 tablet, Rfl: 0  Current Facility-Administered Medications:  .  Benralizumab SOSY 30 mg, 30 mg, Subcutaneous, Q14 Days, Bobbitt, Heywood Iles, MD, 30 mg at 03/02/19 1118   Steffanie Dunn, DO [facility] 03/10/2019 1:42 PM

## 2019-03-19 ENCOUNTER — Other Ambulatory Visit: Payer: Self-pay

## 2019-03-19 ENCOUNTER — Ambulatory Visit (INDEPENDENT_AMBULATORY_CARE_PROVIDER_SITE_OTHER): Payer: Medicare (Managed Care) | Admitting: Critical Care Medicine

## 2019-03-19 ENCOUNTER — Encounter: Payer: Self-pay | Admitting: Critical Care Medicine

## 2019-03-19 VITALS — BP 120/68 | HR 89 | Temp 97.3°F | Ht 65.0 in | Wt 266.8 lb

## 2019-03-19 DIAGNOSIS — J3089 Other allergic rhinitis: Secondary | ICD-10-CM | POA: Diagnosis not present

## 2019-03-19 DIAGNOSIS — J4550 Severe persistent asthma, uncomplicated: Secondary | ICD-10-CM | POA: Diagnosis not present

## 2019-03-19 MED ORDER — AMOXICILLIN-POT CLAVULANATE 875-125 MG PO TABS: 1.0000 | ORAL_TABLET | Freq: Two times a day (BID) | ORAL | 0 refills | Status: DC

## 2019-03-19 MED ORDER — PREDNISONE 10 MG PO TABS: 10.0000 mg | ORAL_TABLET | Freq: Every day | ORAL | 1 refills | Status: DC

## 2019-03-19 NOTE — Progress Notes (Signed)
Synopsis: Referred in September 2016 for asthma, OHS, OSA by Elaine Bastos, MD.  Previously patient of Dr. Craige Lee.  Subjective:   PATIENT ID: Elaine Lee GENDER: female DOB: May 08, 1945, MRN: [id number]  Chief Complaint  Patient presents with  . Follow-up    Patient is feeling about the same from last visit. Patient started prednisone and azelastine nasal spray.     [name] is a 74 year old woman with a history of severe persistent allergic asthma who presents for follow-up.  She was started on prednisone 10 mg daily due to persistent uncontrolled symptoms last week.  Her symptoms are stable or mildly improved.  She feels like catching up and started on prednisone they would have continued worsening as they have in the past.  Her next Fasenra injection is later this month.  She remains on Spiriva, high-dose Advair plus budesonide nebs, montelukast, Claritin, and azelastine nasal spray.  Not been using Flonase.  Her heartburn is improved.    OV 03/10/19: Elaine Lee is a 74 y/o woman with a history of severe persistent asthma.  She has required hospitalizations 3 times in the last several months, most recently overnight on February 15, 2019.  She was discharged on prednisone.  She followed up with her allergist, Dr. Katina Dung, and her initiation of Harrington Challenger was delayed due to asthma exacerbation.  At that time she was started on prednisone and inhaled budesonide in addition to high-dose Advair and Spiriva.  She continues to take montelukast daily, Flonase twice daily, Mucinex twice daily, and Claritin once daily.  She has not been using her flutter valve.  She is using albuterol nebs 4 times daily, but no longer using saline sinus rinses.  She last finished prednisone on 03/06/2019, with no change in symptoms since then.  She continues to have frequent coughing with occasional yellow sputum production.  She is constantly short of breath, worse with exertion.  She has frequent  wheezing.  She has walked with her pulse oximeter at home, dropped as low as 86%, but quickly recovers into the 90s.  She was able to receive her first dose of Fasenra on 03/02/2019 without problems.  Her allergist has escalated her GERD regimen to include both a PPI and famotidine.     OV12/01/2018: Elaine Lee is a 74 year old woman with a history of allergies, asthma and OSA on CPAP.  She formally had OHS requiring home oxygen, but has not needed for the past 2 years after losing weight.  Her asthma has been worse recently.  She has been hospitalized 2 times in the past few months at [facility].  She has been following up with her allergist Dr. Nunzio Cobbs, who is planning on starting her on an anti-IL-5 biologic in the future.  He added Spiriva to her regimen at her last visit, which she thinks is helping.  She remains on Advair 250-50, montelukast, Claritin, and Flonase nasal spray.  She has been using her nebulizer less in the last few weeks since recovering from her exacerbation-twice per day in the morning and before bed.  Due to nocturnal symptoms she has not been using her CPAP at night.  Her symptoms are exacerbated by strong smells, and when her allergies are worse, commonly this time of year.  Wheezing is her main complaint.  Her nonproductive cough has caused her to have rib pain, but overall her cough is improving since hospitalization.  She has 1 pet dog, but denies a history of allergies to dogs.  She is concerned that she has mold in a closet from a previous leak in her roof that she has not been able to have fixed yet.  Breathing is a contributor to her activity limitation, but she also attributes hip and back pain.   She has a history of chronic GERD that is uncontrolled.  She is taking Pepcid twice daily and has never been on a PPI.  She has frequent belching and throat clearing.  She avoids spicy foods, eating mostly bland foods, which helps.  She does not drink caffeine, but frequently  drinks carbonated beverages and eats chocolate.  She has struggled more with her depression this year.  [facility], but has been unable to go since it has been closed due to COVID-19.  Her husband passed away suddenly in the spring.  She now lives with her son, but has stress due to his history of drug use.  While she was recently hospitalized she had a CT scan which demonstrated a pleural effusion.     Past Medical History:  Diagnosis Date  . A-fib (HCC)   . Asthma   . CHF (congestive heart failure) (HCC)   . Chronic edema   . CKD (chronic kidney disease), stage III   . COPD (chronic obstructive pulmonary disease) (HCC)   . Degenerative lumbar disc   . Depression   . Diabetes mellitus without complication (HCC)   . Diabetic neuropathy (HCC)   . GERD (gastroesophageal reflux disease)   . Gout   . Hearing loss   . Hypercholesteremia   . Hypertension   . Insomnia   . Obstructive sleep apnea on CPAP   . Osteoarthritis    knees and shoulders  . Renal disorder   . Urinary frequency      Family History  Problem Relation Age of Onset  . Allergies Mother   . Rheumatologic disease Mother   . Cancer Mother        leukemia  . Allergies Father   . Asthma Father   . Heart disease Father   . Rheumatologic disease Father   . Cancer Father        leukemia  . Allergies Daughter   . Cancer Sister   . Breast cancer Maternal Aunt   . Breast cancer Paternal Aunt   . Breast cancer Maternal Aunt   . Allergic rhinitis Neg Hx   . Eczema Neg Hx   . Urticaria Neg Hx      Past Surgical History:  Procedure Laterality Date  . ABDOMINAL HYSTERECTOMY    . KNEE ARTHROPLASTY Right 12/05/2017   Procedure: COMPUTER ASSISTED TOTAL KNEE ARTHROPLASTY;  Surgeon: Samson Frederic, MD;  Location: [facility];  Service: Orthopedics;  Laterality: Right;  . ROTATOR CUFF REPAIR  1980   x3  . TONSILLECTOMY      Social History   Socioeconomic History  .  Marital status: Married    Spouse name: Not on file  . Number of children: Not on file  . Years of education: Not on file  . Highest education level: Not on file  Occupational History  . Not on file  Tobacco Use  . Smoking status: Never Smoker  . Smokeless tobacco: Never Used  Substance and Sexual Activity  . Alcohol use: No    Alcohol/week: 0.0 standard drinks  . Drug use: Never  . Sexual activity: Not on file  Other Topics Concern  . Not on file  Social History Narrative  . Not on file   Social Determinants of Health   Financial Resource Strain:   . Difficulty of Paying Living Expenses:   Food Insecurity:   . Worried About Programme researcher, broadcasting/film/video in the Last Year:   . Barista in the Last Year:   Transportation Needs:   . Freight forwarder (Medical):   Marland Kitchen Lack of Transportation (Non-Medical):   Physical Activity:   . Days of Exercise per Week:   . Minutes of Exercise per Session:   Stress:   . Feeling of Stress :   Social Connections:   . Frequency of Communication with Friends and Family:   . Frequency of Social Gatherings with Friends and Family:   . Attends Religious Services:   . Active Member of Clubs or Organizations:   . Attends Banker Meetings:   Marland Kitchen Marital Status:   Intimate Partner Violence:   . Fear of Current or Ex-Partner:   . Emotionally Abused:   Marland Kitchen Physically Abused:   . Sexually Abused:      Allergies  Allergen Reactions  . Other Shortness Of Breath and Other (See Comments)    ROSEMARY AND HEAVY SCENTED PERFUMES, DETERGENTS, ETC  . Tape Itching, Rash and Other (See Comments)    Burns and blisters USE PAPER TAPE  . Trazodone And Nefazodone Shortness Of Breath and Swelling  . Celebrex [Celecoxib] Other (See Comments)    HARD TIME HEARING, LIKE BEING UNDERWATER  . Latex Hives, Itching and Rash  . Mysoline [Primidone] Other (See Comments)    unknown  . Niacin And Related Hives and Swelling  . Sulfa Antibiotics Other (See  Comments)    TONGUE BLISTERS    . Aspirin Other (See Comments)    GI PROBLEMS AND PAIN     Immunization History  Administered Date(s) Administered  . Fluad Quad(high Dose 65+) 04/02/2018, 12/02/2018  . Influenza,inj,Quad PF,6+ Mos 10/04/2014    Outpatient Medications Prior to Visit  Medication Sig Dispense Refill  . acetaminophen (TYLENOL) 500 MG tablet Take 500 mg by mouth every 6 (six) hours as needed.    Marland Kitchen albuterol (ACCUNEB) 0.63 MG/3ML nebulizer solution Take 1 ampule by nebulization 3 (three) times daily as needed for wheezing or shortness of breath.    Marland Kitchen albuterol (VENTOLIN HFA) 108 (90 Base) MCG/ACT inhaler Inhale 1-2 puffs into the lungs every 6 (six) hours as needed for wheezing or shortness of breath.    Marland Kitchen atorvastatin (LIPITOR) 10 MG tablet Take 10 mg by mouth at bedtime.     Marland Kitchen azelastine (ASTELIN) 0.1 % nasal spray Place 2 sprays into both nostrils 2 (two) times daily. Use in each nostril as directed 30 mL 12  . Azelastine-Fluticasone 137-50 MCG/ACT SUSP Place 1-2 sprays into the nose 2 (two) times daily as needed. 23 g 5  . budesonide (PULMICORT) 0.5 MG/2ML nebulizer solution Take 2 mLs (0.5 mg total) by nebulization in the morning and at bedtime. 120 mL 5  . Cholecalciferol (VITAMIN D3) 125 MCG (5000 UT) CAPS Take 5,000 Units by mouth daily.    . citalopram (CELEXA) 20 MG tablet Take 20 mg by mouth daily.    Marland Kitchen diltiazem (CARDIZEM CD) 240 MG 24 hr capsule Take 240 mg by mouth daily.    Marland Kitchen diltiazem (TIAZAC) 240 MG 24 hr capsule Take by mouth.    . famotidine (PEPCID) 20 MG tablet Take 1 tablet (20 mg total) by mouth 2 (two) times daily. 60  tablet 3  . fluticasone-salmeterol (ADVAIR HFA) 230-21 MCG/ACT inhaler Two puffs with spacer twice a day 12 g 5  . furosemide (LASIX) 40 MG tablet Take 40 mg by mouth daily.     Marland Kitchen. gabapentin (NEURONTIN) 600 MG tablet Take 1 tablet (600 mg total) by mouth 2 (two) times daily. (Patient taking differently: Take 600-1,200 mg by mouth See admin  instructions. Take 600 mg by mouth in the morning and take 1200 mg by mouth at bedtime) 60 tablet 2  . guaiFENesin (MUCINEX) 600 MG 12 hr tablet Take 600 mg by mouth 2 (two) times daily.    . Insulin Glargine (BASAGLAR KWIKPEN) 100 UNIT/ML SOPN Inject 6 Units into the skin daily.    Marland Kitchen. liraglutide (VICTOZA) 18 MG/3ML SOPN Inject 1.2 mg into the skin daily.     Marland Kitchen. loratadine (CLARITIN) 10 MG tablet Take 10 mg by mouth every morning.    Marland Kitchen. losartan (COZAAR) 100 MG tablet Take 100 mg by mouth daily.    . Menthol, Topical Analgesic, (BIOFREEZE) 4 % GEL Apply topically. Apply topically as needed to painful joints    . mirabegron ER (MYRBETRIQ) 25 MG TB24 tablet Take 25 mg by mouth daily.    . montelukast (SINGULAIR) 10 MG tablet Take 1 tablet (10 mg total) by mouth at bedtime. 30 tablet 5  . Mouthwashes (BIOTENE DRY MOUTH GENTLE) LIQD Use as directed in the mouth or throat. 1 cap full Q2 hr PRN for dry mouth    . nystatin (NYSTATIN) powder Apply 1 g topically 4 (four) times daily as needed (for skin folds).     . pantoprazole (PROTONIX) 40 MG tablet Take 1 tablet (40 mg total) by mouth daily. 30 tablet 11  . polyethylene glycol (MIRALAX / GLYCOLAX) packet Take 17 g by mouth daily. (Patient taking differently: Take 17 g by mouth daily as needed for moderate constipation. ) 14 each 0  . rivaroxaban (XARELTO) 20 MG TABS tablet Take 20 mg by mouth daily with supper.    Marland Kitchen. rOPINIRole (REQUIP) 0.5 MG tablet Take 1 mg by mouth at bedtime.    . senna (SENOKOT) 8.6 MG TABS tablet Take 2 tablets (17.2 mg total) by mouth at bedtime. 120 each 0  . simethicone (MYLICON) 125 MG chewable tablet Chew 125 mg by mouth every 6 (six) hours as needed for flatulence.    Marland Kitchen. tetrahydrozoline (VISINE) 0.05 % ophthalmic solution Place 1 drop into both eyes 4 (four) times daily as needed (for eye irritation).     . Tiotropium Bromide Monohydrate (SPIRIVA RESPIMAT) 2.5 MCG/ACT AERS Inhale 2 puffs into the lungs 1 day or 1 dose. 4 g 5    . traMADol (ULTRAM) 50 MG tablet Take 50 mg by mouth every 6 (six) hours as needed.    . predniSONE (DELTASONE) 10 MG tablet Take 1 tablet (10 mg total) by mouth daily with breakfast. 20 tablet 0   Facility-Administered Medications Prior to Visit  Medication Dose Route Frequency Provider Last Rate Last Admin  . Benralizumab SOSY 30 mg  30 mg Subcutaneous Q14 Days Bobbitt, Heywood Ilesalph Carter, MD   30 mg at 03/02/19 1118    Review of Systems  Constitutional: Negative for chills, fever and weight loss.  HENT: Positive for congestion.        Postnasal drip  Eyes: Negative.   Respiratory: Positive for cough, shortness of breath and wheezing. Negative for sputum production.   Cardiovascular: Negative for chest pain and leg swelling.  Gastrointestinal: Positive for  heartburn. Negative for abdominal pain, diarrhea, nausea and vomiting.  Genitourinary: Negative.   Musculoskeletal: Negative.   Skin: Negative for rash.  Neurological: Negative for weakness and headaches.  Endo/Heme/Allergies: Positive for environmental allergies.  Psychiatric/Behavioral: Positive for depression. The patient is nervous/anxious.      Objective:   Vitals:   03/19/19 1430  BP: 120/68  Pulse: 89  Temp: (!) 97.3 F (36.3 C)  TempSrc: Temporal  SpO2: 92%  Weight: 266 lb 12.8 oz (121 kg)  Height: 5\' 5"  (1.651 m)   92% on   RA BMI Readings from Last 3 Encounters:  03/19/19 44.40 kg/m  03/10/19 44.43 kg/m  12/02/18 45.33 kg/m   Wt Readings from Last 3 Encounters:  03/19/19 266 lb 12.8 oz (121 kg)  03/10/19 267 lb (121.1 kg)  12/02/18 268 lb 12.8 oz (121.9 kg)    Physical Exam Vitals reviewed.  Constitutional:      Appearance: She is obese.     Comments: Chronically ill-appearing  HENT:     Head: Normocephalic and atraumatic.  Eyes:     General: No scleral icterus. Cardiovascular:     Rate and Rhythm: Normal rate and regular rhythm.     Heart sounds: No murmur.  Pulmonary:     Comments:  Breathing comfortably on room air, audible wheezing.  No accessory muscle use.  Diffuse expiratory wheezing on exam. Abdominal:     General: There is no distension.     Palpations: Abdomen is soft.  Musculoskeletal:        General: No deformity.     Cervical back: Neck supple.  Lymphadenopathy:     Cervical: No cervical adenopathy.  Skin:    General: Skin is warm and dry.     Findings: No rash.  Neurological:     General: No focal deficit present.     Mental Status: She is alert.     Coordination: Coordination normal.  Psychiatric:        Mood and Affect: Mood normal.        Behavior: Behavior normal.      CBC    Component Value Date/Time   WBC 9.2 11/07/2018 1655   WBC 10.8 (H) 12/08/2017 0439   RBC 3.92 11/07/2018 1655   RBC 3.08 (L) 12/08/2017 0439   HGB 11.2 11/07/2018 1655   HCT 33.7 (L) 11/07/2018 1655   PLT 209 11/07/2018 1655   MCV 86 11/07/2018 1655   MCH 28.6 11/07/2018 1655   MCH 28.2 12/08/2017 0439   MCHC 33.2 11/07/2018 1655   MCHC 31.4 12/08/2017 0439   RDW 13.1 11/07/2018 1655   LYMPHSABS 1.8 11/07/2018 1655   MONOABS 0.4 06/26/2017 0614   EOSABS 0.5 (H) 11/07/2018 1655   BASOSABS 0.0 11/07/2018 1655    CHEMISTRY No results for input(s): NA, K, CL, CO2, GLUCOSE, BUN, CREATININE, CALCIUM, MG, PHOS in the last 168 hours. CrCl cannot be calculated (Patient's most recent lab result is older than the maximum 21 days allowed.).  Alpha-1 antitrypsin phenotype MM, 146 Alpha gal panel negative IgE 247 Allergy panel negative (per Dr. 13/06/2018 notes, skin testing was positive for major mold mix #4)  Chest Imaging- films reviewed: CXR, 2 view 11/22/2017-prominent hila bilaterally, airway thickening.  Loss of AP window on the left.  CT chest 02/20/2019- upper lobe predominant air trapping, RML nodule suspicious for mucus plugging.   Pulmonary Functions Testing Results: No flowsheet data found.  11/06/2018 office spirometry: FVC 1.64 (56%)--> 1.48 (51%)  , -10% FEV1 1.2 (  55%) --> 1.04 (47%), -13% Ratio 0.73--> 0.70 Expiratory loop consistent with obstruction   Echocardiogram 04/06/2016: LVEF 60 to 65%, indeterminate diastolic function.  Mildly dilated LA, normal RV, mildly dilated RA.  Enlarged IVC with reduced changes with respiration.  Mild TR, otherwise normal valves.      Assessment & Plan:     ICD-10-CM   1. Allergic rhinitis with a predominantly nonallergic component  J30.89   2. Severe persistent asthma without complication  V94.80     Severe persistent asthma, uncontrolled. -Continue prednisone 10 mg daily -Continue high-dose Advair and budesonide nebs -Continue Spiriva -Continue montelukast and Claritin daily -Resume using Flonase-using 2 sprays bilaterally twice daily -Continue azelastine nasal spray 2 sprays bilaterally twice daily -Prescriptions to [phone], [facility] -Agree with Fasenra infusions. Appreciate Dr. Mariane Masters assistance. -Continue aggressive GERD management -Up-to-date on seasonal flu shot.  Recommend Covid shot when it is available.  Allergic rhinosinusitis -May need ENT evaluation -Continue Flonase, antihistamine, azelastine nasal spray, montelukast  Abnormal chest x-ray in 2019 with increased left hilar prominence.  Reported history of recent pleural effusion. -No additional follow-up needed based on CT results  RTC in 2 weeks.     Current Outpatient Medications:  .  acetaminophen (TYLENOL) 500 MG tablet, Take 500 mg by mouth every 6 (six) hours as needed., Disp: , Rfl:  .  albuterol (ACCUNEB) 0.63 MG/3ML nebulizer solution, Take 1 ampule by nebulization 3 (three) times daily as needed for wheezing or shortness of breath., Disp: , Rfl:  .  albuterol (VENTOLIN HFA) 108 (90 Base) MCG/ACT inhaler, Inhale 1-2 puffs into the lungs every 6 (six) hours as needed for wheezing or shortness of breath., Disp: , Rfl:  .  atorvastatin (LIPITOR) 10 MG tablet, Take 10 mg by mouth at bedtime. , Disp:  , Rfl:  .  azelastine (ASTELIN) 0.1 % nasal spray, Place 2 sprays into both nostrils 2 (two) times daily. Use in each nostril as directed, Disp: 30 mL, Rfl: 12 .  Azelastine-Fluticasone 137-50 MCG/ACT SUSP, Place 1-2 sprays into the nose 2 (two) times daily as needed., Disp: 23 g, Rfl: 5 .  budesonide (PULMICORT) 0.5 MG/2ML nebulizer solution, Take 2 mLs (0.5 mg total) by nebulization in the morning and at bedtime., Disp: 120 mL, Rfl: 5 .  Cholecalciferol (VITAMIN D3) 125 MCG (5000 UT) CAPS, Take 5,000 Units by mouth daily., Disp: , Rfl:  .  citalopram (CELEXA) 20 MG tablet, Take 20 mg by mouth daily., Disp: , Rfl:  .  diltiazem (CARDIZEM CD) 240 MG 24 hr capsule, Take 240 mg by mouth daily., Disp: , Rfl:  .  diltiazem (TIAZAC) 240 MG 24 hr capsule, Take by mouth., Disp: , Rfl:  .  famotidine (PEPCID) 20 MG tablet, Take 1 tablet (20 mg total) by mouth 2 (two) times daily., Disp: 60 tablet, Rfl: 3 .  fluticasone-salmeterol (ADVAIR HFA) 230-21 MCG/ACT inhaler, Two puffs with spacer twice a day, Disp: 12 g, Rfl: 5 .  furosemide (LASIX) 40 MG tablet, Take 40 mg by mouth daily. , Disp: , Rfl:  .  gabapentin (NEURONTIN) 600 MG tablet, Take 1 tablet (600 mg total) by mouth 2 (two) times daily. (Patient taking differently: Take 600-1,200 mg by mouth See admin instructions. Take 600 mg by mouth in the morning and take 1200 mg by mouth at bedtime), Disp: 60 tablet, Rfl: 2 .  guaiFENesin (MUCINEX) 600 MG 12 hr tablet, Take 600 mg by mouth 2 (two) times daily., Disp: , Rfl:  .  Insulin Glargine (BASAGLAR KWIKPEN) 100 UNIT/ML SOPN, Inject 6 Units into the skin daily., Disp: , Rfl:  .  liraglutide (VICTOZA) 18 MG/3ML SOPN, Inject 1.2 mg into the skin daily. , Disp: , Rfl:  .  loratadine (CLARITIN) 10 MG tablet, Take 10 mg by mouth every morning., Disp: , Rfl:  .  losartan (COZAAR) 100 MG tablet, Take 100 mg by mouth daily., Disp: , Rfl:  .  Menthol, Topical Analgesic, (BIOFREEZE) 4 % GEL, Apply topically. Apply  topically as needed to painful joints, Disp: , Rfl:  .  mirabegron ER (MYRBETRIQ) 25 MG TB24 tablet, Take 25 mg by mouth daily., Disp: , Rfl:  .  montelukast (SINGULAIR) 10 MG tablet, Take 1 tablet (10 mg total) by mouth at bedtime., Disp: 30 tablet, Rfl: 5 .  Mouthwashes (BIOTENE DRY MOUTH GENTLE) LIQD, Use as directed in the mouth or throat. 1 cap full Q2 hr PRN for dry mouth, Disp: , Rfl:  .  nystatin (NYSTATIN) powder, Apply 1 g topically 4 (four) times daily as needed (for skin folds). , Disp: , Rfl:  .  pantoprazole (PROTONIX) 40 MG tablet, Take 1 tablet (40 mg total) by mouth daily., Disp: 30 tablet, Rfl: 11 .  polyethylene glycol (MIRALAX / GLYCOLAX) packet, Take 17 g by mouth daily. (Patient taking differently: Take 17 g by mouth daily as needed for moderate constipation. ), Disp: 14 each, Rfl: 0 .  rivaroxaban (XARELTO) 20 MG TABS tablet, Take 20 mg by mouth daily with supper., Disp: , Rfl:  .  rOPINIRole (REQUIP) 0.5 MG tablet, Take 1 mg by mouth at bedtime., Disp: , Rfl:  .  senna (SENOKOT) 8.6 MG TABS tablet, Take 2 tablets (17.2 mg total) by mouth at bedtime., Disp: 120 each, Rfl: 0 .  simethicone (MYLICON) 125 MG chewable tablet, Chew 125 mg by mouth every 6 (six) hours as needed for flatulence., Disp: , Rfl:  .  tetrahydrozoline (VISINE) 0.05 % ophthalmic solution, Place 1 drop into both eyes 4 (four) times daily as needed (for eye irritation). , Disp: , Rfl:  .  Tiotropium Bromide Monohydrate (SPIRIVA RESPIMAT) 2.5 MCG/ACT AERS, Inhale 2 puffs into the lungs 1 day or 1 dose., Disp: 4 g, Rfl: 5 .  traMADol (ULTRAM) 50 MG tablet, Take 50 mg by mouth every 6 (six) hours as needed., Disp: , Rfl:  .  amoxicillin-clavulanate (AUGMENTIN) 875-125 MG tablet, Take 1 tablet by mouth 2 (two) times daily., Disp: 20 tablet, Rfl: 0 .  predniSONE (DELTASONE) 10 MG tablet, Take 1 tablet (10 mg total) by mouth daily with breakfast., Disp: 30 tablet, Rfl: 1  Current Facility-Administered Medications:    .  Benralizumab SOSY 30 mg, 30 mg, Subcutaneous, Q14 Days, Bobbitt, Heywood Iles, MD, 30 mg at 03/02/19 1118   Steffanie Dunn, DO [facility] 03/19/2019 7:29 PM

## 2019-03-19 NOTE — Patient Instructions (Addendum)
Thank you for visiting Dr. Chestine Spore at [facility]. We recommend the following:   Meds ordered this encounter  Medications  . amoxicillin-clavulanate (AUGMENTIN) 875-125 MG tablet    Sig: Take 1 tablet by mouth 2 (two) times daily.    Dispense:  20 tablet    Refill:  0  . predniSONE (DELTASONE) 10 MG tablet    Sig: Take 1 tablet (10 mg total) by mouth daily with breakfast.    Dispense:  30 tablet    Refill:  1    Return in about 2 weeks (around 04/02/2019).    Please do your part to reduce the spread of COVID-19.

## 2019-03-23 ENCOUNTER — Telehealth: Payer: Self-pay | Admitting: *Deleted

## 2019-03-23 NOTE — Telephone Encounter (Signed)
L/M for patient reminding her that she has appt for 3/29 for Fasenra injection and not sure if she needs to contact her PCP to advise same and bring with her.  [facility] pays for same and I believe she brought same with her last time

## 2019-03-30 ENCOUNTER — Ambulatory Visit: Payer: Medicare (Managed Care)

## 2019-03-30 ENCOUNTER — Other Ambulatory Visit: Payer: Self-pay

## 2019-04-06 ENCOUNTER — Ambulatory Visit (INDEPENDENT_AMBULATORY_CARE_PROVIDER_SITE_OTHER): Payer: Medicare (Managed Care)

## 2019-04-06 ENCOUNTER — Other Ambulatory Visit: Payer: Self-pay

## 2019-04-06 DIAGNOSIS — J4550 Severe persistent asthma, uncomplicated: Secondary | ICD-10-CM

## 2019-04-06 DIAGNOSIS — J4551 Severe persistent asthma with (acute) exacerbation: Secondary | ICD-10-CM

## 2019-04-08 ENCOUNTER — Ambulatory Visit (INDEPENDENT_AMBULATORY_CARE_PROVIDER_SITE_OTHER): Payer: Medicare (Managed Care) | Admitting: Critical Care Medicine

## 2019-04-08 ENCOUNTER — Other Ambulatory Visit: Payer: Self-pay

## 2019-04-08 ENCOUNTER — Encounter: Payer: Self-pay | Admitting: Critical Care Medicine

## 2019-04-08 VITALS — BP 136/66 | Temp 97.6°F | Ht 65.0 in | Wt 273.2 lb

## 2019-04-08 DIAGNOSIS — J309 Allergic rhinitis, unspecified: Secondary | ICD-10-CM

## 2019-04-08 DIAGNOSIS — J4550 Severe persistent asthma, uncomplicated: Secondary | ICD-10-CM

## 2019-04-08 NOTE — Patient Instructions (Addendum)
Thank you for visiting Dr. Chestine Spore at [facility]. We recommend the following:  Stay on prescribed inhalers- Spiriva once daily and Advair twice daily.  Con't taking singulair and loratidine allergy pills. Azelastine nasal spray two times daily.   Return in about 4 weeks (around 05/06/2019).    Please do your part to reduce the spread of COVID-19.

## 2019-04-08 NOTE — Progress Notes (Signed)
Synopsis: Referred in September 2016 for asthma, OHS, OSA by Jethro Bastos, MD.  Previously patient of Dr. Craige Cotta.  Subjective:   PATIENT ID: Elaine Lee GENDER: female DOB: 1945/08/21, MRN: [id number]  Chief Complaint  Patient presents with  . Follow-up    2 wk f/u.  Hardly any coughing spells.      Elaine Lee is a 74 y/o woman with severe persistent asthma who presents for follow up. Since her last visit she completed her course of Augmentin and has been on prednisone 10 mg daily.  Her symptoms are significantly improved.  She is no longer wheezing, sputum production has resolved since completing Augmentin.  Much less coughing.  She would like to be able to stop prednisone due to weight gain.  She had her second Fasenra injection last week.  Her next Harrington Challenger injection is in June.  She is continued on her Advair and Spiriva inhalers.  She continues to use azelastine nasal spray.  She remains on Singulair and Claritin daily.  She feels that she is doing pretty well for this time of year did not be admitted to the hospital.    OV 03/19/19: Elaine Lee is a 74 year old woman with a history of severe persistent allergic asthma who presents for follow-up.  She was started on prednisone 10 mg daily due to persistent uncontrolled symptoms last week.  Her symptoms are stable or mildly improved.  She feels like catching up and started on prednisone they would have continued worsening as they have in the past.  Her next Fasenra injection is later this month.  She remains on Spiriva, high-dose Advair plus budesonide nebs, montelukast, Claritin, and azelastine nasal spray.  Not been using Flonase.  Her heartburn is improved.   OV 03/10/19: Elaine Lee is a 74 y/o woman with a history of severe persistent asthma.  She has required hospitalizations 3 times in the last several months, most recently overnight on February 15, 2019.  She was discharged on prednisone.  She followed up with her  allergist, Dr. Katina Dung, and her initiation of Harrington Challenger was delayed due to asthma exacerbation.  At that time she was started on prednisone and inhaled budesonide in addition to high-dose Advair and Spiriva.  She continues to take montelukast daily, Flonase twice daily, Mucinex twice daily, and Claritin once daily.  She has not been using her flutter valve.  She is using albuterol nebs 4 times daily, but no longer using saline sinus rinses.  She last finished prednisone on 03/06/2019, with no change in symptoms since then.  She continues to have frequent coughing with occasional yellow sputum production.  She is constantly short of breath, worse with exertion.  She has frequent wheezing.  She has walked with her pulse oximeter at home, dropped as low as 86%, but quickly recovers into the 90s.  She was able to receive her first dose of Fasenra on 03/02/2019 without problems.  Her allergist has escalated her GERD regimen to include both a PPI and famotidine.     OV12/01/2018: Elaine Lee is a 74 year old woman with a history of allergies, asthma and OSA on CPAP.  She formally had OHS requiring home oxygen, but has not needed for the past 2 years after losing weight.  Her asthma has been worse recently.  She has been hospitalized 2 times in the past few months at [facility].  She has been following up with her allergist Dr. Nunzio Cobbs, who is planning on starting her on  an anti-IL-5 biologic in the future.  He added Spiriva to her regimen at her last visit, which she thinks is helping.  She remains on Advair 250-50, montelukast, Claritin, and Flonase nasal spray.  She has been using her nebulizer less in the last few weeks since recovering from her exacerbation-twice per day in the morning and before bed.  Due to nocturnal symptoms she has not been using her CPAP at night.  Her symptoms are exacerbated by strong smells, and when her allergies are worse, commonly this time of year.  Wheezing is her main  complaint.  Her nonproductive cough has caused her to have rib pain, but overall her cough is improving since hospitalization.  She has 1 pet dog, but denies a history of allergies to dogs.  She is concerned that she has mold in a closet from a previous leak in her roof that she has not been able to have fixed yet.  Breathing is a contributor to her activity limitation, but she also attributes hip and back pain.   She has a history of chronic GERD that is uncontrolled.  She is taking Pepcid twice daily and has never been on a PPI.  She has frequent belching and throat clearing.  She avoids spicy foods, eating mostly bland foods, which helps.  She does not drink caffeine, but frequently drinks carbonated beverages and eats chocolate.  She has struggled more with her depression this year.  [facility], but has been unable to go since it has been closed due to COVID-19.  Her husband passed away suddenly in the spring.  She now lives with her son, but has stress due to his history of drug use.  While she was recently hospitalized she had a CT scan which demonstrated a pleural effusion.     Past Medical History:  Diagnosis Date  . A-fib (HCC)   . Asthma   . CHF (congestive heart failure) (HCC)   . Chronic edema   . CKD (chronic kidney disease), stage III   . COPD (chronic obstructive pulmonary disease) (HCC)   . Degenerative lumbar disc   . Depression   . Diabetes mellitus without complication (HCC)   . Diabetic neuropathy (HCC)   . GERD (gastroesophageal reflux disease)   . Gout   . Hearing loss   . Hypercholesteremia   . Hypertension   . Insomnia   . Obstructive sleep apnea on CPAP   . Osteoarthritis    knees and shoulders  . Renal disorder   . Urinary frequency      Family History  Problem Relation Age of Onset  . Allergies Mother   . Rheumatologic disease Mother   . Cancer Mother        leukemia  . Allergies Father   . Asthma  Father   . Heart disease Father   . Rheumatologic disease Father   . Cancer Father        leukemia  . Allergies Daughter   . Cancer Sister   . Breast cancer Maternal Aunt   . Breast cancer Paternal Aunt   . Breast cancer Maternal Aunt   . Allergic rhinitis Neg Hx   . Eczema Neg Hx   . Urticaria Neg Hx      Past Surgical History:  Procedure Laterality Date  . ABDOMINAL HYSTERECTOMY    . KNEE ARTHROPLASTY Right 12/05/2017   Procedure: COMPUTER ASSISTED TOTAL KNEE ARTHROPLASTY;  Surgeon: Samson Frederic, MD;  Location: [facility];  Service: Orthopedics;  Laterality: Right;  . ROTATOR CUFF REPAIR  1980   x3  . TONSILLECTOMY      Social History   Socioeconomic History  . Marital status: Married    Spouse name: Not on file  . Number of children: Not on file  . Years of education: Not on file  . Highest education level: Not on file  Occupational History  . Not on file  Tobacco Use  . Smoking status: Never Smoker  . Smokeless tobacco: Never Used  Substance and Sexual Activity  . Alcohol use: No    Alcohol/week: 0.0 standard drinks  . Drug use: Never  . Sexual activity: Not on file  Other Topics Concern  . Not on file  Social History Narrative  . Not on file   Social Determinants of Health   Financial Resource Strain:   . Difficulty of Paying Living Expenses:   Food Insecurity:   . Worried About Programme researcher, broadcasting/film/video in the Last Year:   . Barista in the Last Year:   Transportation Needs:   . Freight forwarder (Medical):   Marland Kitchen Lack of Transportation (Non-Medical):   Physical Activity:   . Days of Exercise per Week:   . Minutes of Exercise per Session:   Stress:   . Feeling of Stress :   Social Connections:   . Frequency of Communication with Friends and Family:   . Frequency of Social Gatherings with Friends and Family:   . Attends Religious Services:   . Active Member of Clubs or Organizations:   . Attends Banker Meetings:   Marland Kitchen Marital  Status:   Intimate Partner Violence:   . Fear of Current or Ex-Partner:   . Emotionally Abused:   Marland Kitchen Physically Abused:   . Sexually Abused:      Allergies  Allergen Reactions  . Other Shortness Of Breath and Other (See Comments)    ROSEMARY AND HEAVY SCENTED PERFUMES, DETERGENTS, ETC  . Tape Itching, Rash and Other (See Comments)    Burns and blisters USE PAPER TAPE  . Trazodone And Nefazodone Shortness Of Breath and Swelling  . Celebrex [Celecoxib] Other (See Comments)    HARD TIME HEARING, LIKE BEING UNDERWATER  . Latex Hives, Itching and Rash  . Mysoline [Primidone] Other (See Comments)    unknown  . Niacin And Related Hives and Swelling  . Sulfa Antibiotics Other (See Comments)    TONGUE BLISTERS    . Aspirin Other (See Comments)    GI PROBLEMS AND PAIN     Immunization History  Administered Date(s) Administered  . Fluad Quad(high Dose 65+) 04/02/2018, 12/02/2018  . Influenza,inj,Quad PF,6+ Mos 10/04/2014    Outpatient Medications Prior to Visit  Medication Sig Dispense Refill  . acetaminophen (TYLENOL) 500 MG tablet Take 500 mg by mouth every 6 (six) hours as needed.    Marland Kitchen albuterol (ACCUNEB) 0.63 MG/3ML nebulizer solution Take 1 ampule by nebulization 3 (three) times daily as needed for wheezing or shortness of breath.    Marland Kitchen albuterol (VENTOLIN HFA) 108 (90 Base) MCG/ACT inhaler Inhale 1-2 puffs into the lungs every 6 (six) hours as needed for wheezing or shortness of breath.    Marland Kitchen atorvastatin (LIPITOR) 10 MG tablet Take 10 mg by mouth at bedtime.     Marland Kitchen azelastine (ASTELIN) 0.1 % nasal spray Place 2 sprays into both nostrils 2 (two) times daily. Use in each nostril as directed 30 mL  12  . budesonide (PULMICORT) 0.5 MG/2ML nebulizer solution Take 2 mLs (0.5 mg total) by nebulization in the morning and at bedtime. 120 mL 5  . Cholecalciferol (VITAMIN D3) 125 MCG (5000 UT) CAPS Take 5,000 Units by mouth daily.    . citalopram (CELEXA) 20 MG tablet Take 20 mg by mouth  daily.    Marland Kitchen diltiazem (CARDIZEM CD) 240 MG 24 hr capsule Take 240 mg by mouth daily.    Marland Kitchen diltiazem (TIAZAC) 240 MG 24 hr capsule Take by mouth.    . fluticasone-salmeterol (ADVAIR HFA) 230-21 MCG/ACT inhaler Two puffs with spacer twice a day 12 g 5  . furosemide (LASIX) 40 MG tablet Take 40 mg by mouth daily.     Marland Kitchen gabapentin (NEURONTIN) 600 MG tablet Take 1 tablet (600 mg total) by mouth 2 (two) times daily. (Patient taking differently: Take 600-1,200 mg by mouth See admin instructions. Take 600 mg by mouth in the morning and take 1200 mg by mouth at bedtime) 60 tablet 2  . guaiFENesin (MUCINEX) 600 MG 12 hr tablet Take 600 mg by mouth 2 (two) times daily.    . Insulin Glargine (BASAGLAR KWIKPEN) 100 UNIT/ML SOPN Inject 6 Units into the skin daily.    Marland Kitchen liraglutide (VICTOZA) 18 MG/3ML SOPN Inject 1.2 mg into the skin daily.     Marland Kitchen loratadine (CLARITIN) 10 MG tablet Take 10 mg by mouth every morning.    Marland Kitchen losartan (COZAAR) 100 MG tablet Take 100 mg by mouth daily.    . Menthol, Topical Analgesic, (BIOFREEZE) 4 % GEL Apply topically. Apply topically as needed to painful joints    . mirabegron ER (MYRBETRIQ) 25 MG TB24 tablet Take 25 mg by mouth daily.    . montelukast (SINGULAIR) 10 MG tablet Take 1 tablet (10 mg total) by mouth at bedtime. 30 tablet 5  . Mouthwashes (BIOTENE DRY MOUTH GENTLE) LIQD Use as directed in the mouth or throat. 1 cap full Q2 hr PRN for dry mouth    . nystatin (NYSTATIN) powder Apply 1 g topically 4 (four) times daily as needed (for skin folds).     . pantoprazole (PROTONIX) 40 MG tablet Take 1 tablet (40 mg total) by mouth daily. 30 tablet 11  . polyethylene glycol (MIRALAX / GLYCOLAX) packet Take 17 g by mouth daily. (Patient taking differently: Take 17 g by mouth daily as needed for moderate constipation. ) 14 each 0  . predniSONE (DELTASONE) 10 MG tablet Take 1 tablet (10 mg total) by mouth daily with breakfast. 30 tablet 1  . rivaroxaban (XARELTO) 20 MG TABS tablet  Take 20 mg by mouth daily with supper.    Marland Kitchen rOPINIRole (REQUIP) 0.5 MG tablet Take 1 mg by mouth at bedtime.    . senna (SENOKOT) 8.6 MG TABS tablet Take 2 tablets (17.2 mg total) by mouth at bedtime. 120 each 0  . simethicone (MYLICON) 332 MG chewable tablet Chew 125 mg by mouth every 6 (six) hours as needed for flatulence.    Marland Kitchen tetrahydrozoline (VISINE) 0.05 % ophthalmic solution Place 1 drop into both eyes 4 (four) times daily as needed (for eye irritation).     . Tiotropium Bromide Monohydrate (SPIRIVA RESPIMAT) 2.5 MCG/ACT AERS Inhale 2 puffs into the lungs 1 day or 1 dose. 4 g 5  . traMADol (ULTRAM) 50 MG tablet Take 50 mg by mouth every 6 (six) hours as needed.    . Azelastine-Fluticasone 137-50 MCG/ACT SUSP Place 1-2 sprays into the nose 2 (  two) times daily as needed. (Patient not taking: Reported on 04/08/2019) 23 g 5  . famotidine (PEPCID) 20 MG tablet Take 1 tablet (20 mg total) by mouth 2 (two) times daily. 60 tablet 3  . amoxicillin-clavulanate (AUGMENTIN) 875-125 MG tablet Take 1 tablet by mouth 2 (two) times daily. (Patient not taking: Reported on 04/08/2019) 20 tablet 0   Facility-Administered Medications Prior to Visit  Medication Dose Route Frequency Provider Last Rate Last Admin  . Benralizumab SOSY 30 mg  30 mg Subcutaneous Q14 Days Bobbitt, Heywood Ilesalph Carter, MD   30 mg at 04/06/19 1424    Review of Systems  Constitutional: Negative for chills, fever and weight loss.  HENT: Positive for congestion.        Postnasal drip  Eyes: Negative.   Respiratory: Positive for cough, shortness of breath and wheezing. Negative for sputum production.   Cardiovascular: Negative for chest pain and leg swelling.  Gastrointestinal: Positive for heartburn. Negative for abdominal pain, diarrhea, nausea and vomiting.  Genitourinary: Negative.   Musculoskeletal: Negative.   Skin: Negative for rash.  Neurological: Negative for weakness and headaches.  Endo/Heme/Allergies: Positive for environmental  allergies.  Psychiatric/Behavioral: Positive for depression. The patient is nervous/anxious.      Objective:   Vitals:   04/08/19 1431  BP: 136/66  Temp: 97.6 F (36.4 C)  TempSrc: Temporal  SpO2: 93%  Weight: 273 lb 3.2 oz (123.9 kg)  Height: 5\' 5"  (1.651 m)   93% on   RA BMI Readings from Last 3 Encounters:  04/08/19 45.46 kg/m  03/19/19 44.40 kg/m  03/10/19 44.43 kg/m   Wt Readings from Last 3 Encounters:  04/08/19 273 lb 3.2 oz (123.9 kg)  03/19/19 266 lb 12.8 oz (121 kg)  03/10/19 267 lb (121.1 kg)    Physical Exam Vitals reviewed.  Constitutional:      Appearance: Normal appearance. She is obese. She is not ill-appearing.  HENT:     Head: Normocephalic and atraumatic.  Eyes:     General: No scleral icterus. Cardiovascular:     Rate and Rhythm: Normal rate and regular rhythm.     Heart sounds: No murmur.  Pulmonary:     Comments: Significantly improved pulmonary exam-no wheezing heard from across the room.  Breathing comfortably on room air.  No coughing.  Clear to auscultation bilaterally. Abdominal:     General: There is no distension.     Palpations: Abdomen is soft.     Tenderness: There is no abdominal tenderness.  Musculoskeletal:        General: No deformity.     Cervical back: Neck supple.     Comments: symmetric lower extremity edema-at baseline.  Lymphadenopathy:     Cervical: No cervical adenopathy.  Skin:    General: Skin is warm and dry.     Findings: No rash.  Neurological:     General: No focal deficit present.     Mental Status: She is alert.     Coordination: Coordination normal.  Psychiatric:        Mood and Affect: Mood normal.        Behavior: Behavior normal.      CBC    Component Value Date/Time   WBC 9.2 11/07/2018 1655   WBC 10.8 (H) 12/08/2017 0439   RBC 3.92 11/07/2018 1655   RBC 3.08 (L) 12/08/2017 0439   HGB 11.2 11/07/2018 1655   HCT 33.7 (L) 11/07/2018 1655   PLT 209 11/07/2018 1655   MCV 86 11/07/2018  1655   MCH 28.6 11/07/2018 1655   MCH 28.2 12/08/2017 0439   MCHC 33.2 11/07/2018 1655   MCHC 31.4 12/08/2017 0439   RDW 13.1 11/07/2018 1655   LYMPHSABS 1.8 11/07/2018 1655   MONOABS 0.4 06/26/2017 0614   EOSABS 0.5 (H) 11/07/2018 1655   BASOSABS 0.0 11/07/2018 1655    CHEMISTRY No results for input(s): NA, K, CL, CO2, GLUCOSE, BUN, CREATININE, CALCIUM, MG, PHOS in the last 168 hours. CrCl cannot be calculated (Patient's most recent lab result is older than the maximum 21 days allowed.).  Alpha-1 antitrypsin phenotype MM, 146 Alpha gal panel negative IgE 247 Allergy panel negative (per Dr. Sheran Fava notes, skin testing was positive for major mold mix #4)  Chest Imaging- films reviewed: CXR, 2 view 11/22/2017-prominent hila bilaterally, airway thickening.  Loss of AP window on the left.  CT chest 02/20/2019- upper lobe predominant air trapping, RML nodule suspicious for mucus plugging.   Pulmonary Functions Testing Results: No flowsheet data found.  11/06/2018 office spirometry: FVC 1.64 (56%)--> 1.48 (51%) , -10% FEV1 1.2 (55%) --> 1.04 (47%), -13% Ratio 0.73--> 0.70 Expiratory loop consistent with obstruction   Echocardiogram 04/06/2016: LVEF 60 to 65%, indeterminate diastolic function.  Mildly dilated LA, normal RV, mildly dilated RA.  Enlarged IVC with reduced changes with respiration.  Mild TR, otherwise normal valves.      Assessment & Plan:     ICD-10-CM   1. Severe persistent asthma without complication  J45.50   2. Allergic rhinitis, unspecified seasonality, unspecified trigger  J30.9     Severe persistent allergic asthma, uncontrolled. Elevated IgE & eosinophils. -Okay to stop prednisone daily.  She will call us if she has a flare when coming off steroids.  If this happens, recommend restarting at 5 mg daily. -Continue high-dose Advair and budesonide nebs. -Continue Spiriva -Continue montelukast and loratadine daily. -Continue azelastine nasal spray twice  daily -Agree with Harrington Challenger.  Next dose June 2021.Marland Kitchen Appreciate Dr. Sheran Fava assistance. -Continue aggressive GERD management -Up-to-date on seasonal flu shot.  Recommend Covid shot.  Recommend pneumonia vaccines. *Prescriptions always sent to to [phone], [facility]  Allergic rhinosinusitis -Continue loratadine, montelukast, azelastine nasal spray  Abnormal chest x-ray in 2019 with increased left hilar prominence.  Reported history of recent pleural effusion. -No additional follow-up needed based on CT results  RTC in 1 month.     Current Outpatient Medications:  .  acetaminophen (TYLENOL) 500 MG tablet, Take 500 mg by mouth every 6 (six) hours as needed., Disp: , Rfl:  .  albuterol (ACCUNEB) 0.63 MG/3ML nebulizer solution, Take 1 ampule by nebulization 3 (three) times daily as needed for wheezing or shortness of breath., Disp: , Rfl:  .  albuterol (VENTOLIN HFA) 108 (90 Base) MCG/ACT inhaler, Inhale 1-2 puffs into the lungs every 6 (six) hours as needed for wheezing or shortness of breath., Disp: , Rfl:  .  atorvastatin (LIPITOR) 10 MG tablet, Take 10 mg by mouth at bedtime. , Disp: , Rfl:  .  azelastine (ASTELIN) 0.1 % nasal spray, Place 2 sprays into both nostrils 2 (two) times daily. Use in each nostril as directed, Disp: 30 mL, Rfl: 12 .  budesonide (PULMICORT) 0.5 MG/2ML nebulizer solution, Take 2 mLs (0.5 mg total) by nebulization in the morning and at bedtime., Disp: 120 mL, Rfl: 5 .  Cholecalciferol (VITAMIN D3) 125 MCG (5000 UT) CAPS, Take 5,000 Units by mouth daily., Disp: , Rfl:  .  citalopram (CELEXA) 20 MG tablet, Take 20  mg by mouth daily., Disp: , Rfl:  .  diltiazem (CARDIZEM CD) 240 MG 24 hr capsule, Take 240 mg by mouth daily., Disp: , Rfl:  .  diltiazem (TIAZAC) 240 MG 24 hr capsule, Take by mouth., Disp: , Rfl:  .  fluticasone-salmeterol (ADVAIR HFA) 230-21 MCG/ACT inhaler, Two puffs with spacer twice a day, Disp: 12 g, Rfl: 5 .  furosemide (LASIX) 40 MG  tablet, Take 40 mg by mouth daily. , Disp: , Rfl:  .  gabapentin (NEURONTIN) 600 MG tablet, Take 1 tablet (600 mg total) by mouth 2 (two) times daily. (Patient taking differently: Take 600-1,200 mg by mouth See admin instructions. Take 600 mg by mouth in the morning and take 1200 mg by mouth at bedtime), Disp: 60 tablet, Rfl: 2 .  guaiFENesin (MUCINEX) 600 MG 12 hr tablet, Take 600 mg by mouth 2 (two) times daily., Disp: , Rfl:  .  Insulin Glargine (BASAGLAR KWIKPEN) 100 UNIT/ML SOPN, Inject 6 Units into the skin daily., Disp: , Rfl:  .  liraglutide (VICTOZA) 18 MG/3ML SOPN, Inject 1.2 mg into the skin daily. , Disp: , Rfl:  .  loratadine (CLARITIN) 10 MG tablet, Take 10 mg by mouth every morning., Disp: , Rfl:  .  losartan (COZAAR) 100 MG tablet, Take 100 mg by mouth daily., Disp: , Rfl:  .  Menthol, Topical Analgesic, (BIOFREEZE) 4 % GEL, Apply topically. Apply topically as needed to painful joints, Disp: , Rfl:  .  mirabegron ER (MYRBETRIQ) 25 MG TB24 tablet, Take 25 mg by mouth daily., Disp: , Rfl:  .  montelukast (SINGULAIR) 10 MG tablet, Take 1 tablet (10 mg total) by mouth at bedtime., Disp: 30 tablet, Rfl: 5 .  Mouthwashes (BIOTENE DRY MOUTH GENTLE) LIQD, Use as directed in the mouth or throat. 1 cap full Q2 hr PRN for dry mouth, Disp: , Rfl:  .  nystatin (NYSTATIN) powder, Apply 1 g topically 4 (four) times daily as needed (for skin folds). , Disp: , Rfl:  .  pantoprazole (PROTONIX) 40 MG tablet, Take 1 tablet (40 mg total) by mouth daily., Disp: 30 tablet, Rfl: 11 .  polyethylene glycol (MIRALAX / GLYCOLAX) packet, Take 17 g by mouth daily. (Patient taking differently: Take 17 g by mouth daily as needed for moderate constipation. ), Disp: 14 each, Rfl: 0 .  predniSONE (DELTASONE) 10 MG tablet, Take 1 tablet (10 mg total) by mouth daily with breakfast., Disp: 30 tablet, Rfl: 1 .  rivaroxaban (XARELTO) 20 MG TABS tablet, Take 20 mg by mouth daily with supper., Disp: , Rfl:  .  rOPINIRole  (REQUIP) 0.5 MG tablet, Take 1 mg by mouth at bedtime., Disp: , Rfl:  .  senna (SENOKOT) 8.6 MG TABS tablet, Take 2 tablets (17.2 mg total) by mouth at bedtime., Disp: 120 each, Rfl: 0 .  simethicone (MYLICON) 125 MG chewable tablet, Chew 125 mg by mouth every 6 (six) hours as needed for flatulence., Disp: , Rfl:  .  tetrahydrozoline (VISINE) 0.05 % ophthalmic solution, Place 1 drop into both eyes 4 (four) times daily as needed (for eye irritation). , Disp: , Rfl:  .  Tiotropium Bromide Monohydrate (SPIRIVA RESPIMAT) 2.5 MCG/ACT AERS, Inhale 2 puffs into the lungs 1 day or 1 dose., Disp: 4 g, Rfl: 5 .  traMADol (ULTRAM) 50 MG tablet, Take 50 mg by mouth every 6 (six) hours as needed., Disp: , Rfl:  .  Azelastine-Fluticasone 137-50 MCG/ACT SUSP, Place 1-2 sprays into the nose 2 (two)  times daily as needed. (Patient not taking: Reported on 04/08/2019), Disp: 23 g, Rfl: 5 .  famotidine (PEPCID) 20 MG tablet, Take 1 tablet (20 mg total) by mouth 2 (two) times daily., Disp: 60 tablet, Rfl: 3  Current Facility-Administered Medications:  .  Benralizumab SOSY 30 mg, 30 mg, Subcutaneous, Q14 Days, Bobbitt, Heywood Iles, MD, 30 mg at 04/06/19 1424   Steffanie Dunn, DO [facility] 04/08/2019 2:43 PM

## 2019-04-29 ENCOUNTER — Ambulatory Visit
Admission: RE | Admit: 2019-04-29 | Discharge: 2019-04-29 | Disposition: A | Discharge status: Home / Self Care | Payer: Medicare (Managed Care) | Source: Ambulatory Visit | Attending: Internal Medicine | Admitting: Internal Medicine

## 2019-04-29 ENCOUNTER — Other Ambulatory Visit: Payer: Self-pay | Admitting: Internal Medicine

## 2019-04-29 DIAGNOSIS — S91332A Puncture wound without foreign body, left foot, initial encounter: Secondary | ICD-10-CM

## 2019-05-11 ENCOUNTER — Ambulatory Visit: Payer: Medicare (Managed Care) | Admitting: Critical Care Medicine

## 2019-05-21 ENCOUNTER — Ambulatory Visit (INDEPENDENT_AMBULATORY_CARE_PROVIDER_SITE_OTHER): Payer: Medicare (Managed Care)

## 2019-05-21 ENCOUNTER — Ambulatory Visit (INDEPENDENT_AMBULATORY_CARE_PROVIDER_SITE_OTHER): Payer: Medicare (Managed Care) | Admitting: Allergy and Immunology

## 2019-05-21 ENCOUNTER — Ambulatory Visit: Payer: Self-pay | Admitting: Allergy & Immunology

## 2019-05-21 ENCOUNTER — Encounter: Payer: Self-pay | Admitting: Allergy and Immunology

## 2019-05-21 DIAGNOSIS — R05 Cough: Secondary | ICD-10-CM

## 2019-05-21 DIAGNOSIS — J3089 Other allergic rhinitis: Secondary | ICD-10-CM

## 2019-05-21 DIAGNOSIS — K219 Gastro-esophageal reflux disease without esophagitis: Secondary | ICD-10-CM

## 2019-05-21 DIAGNOSIS — R053 Chronic cough: Secondary | ICD-10-CM

## 2019-05-21 DIAGNOSIS — J4550 Severe persistent asthma, uncomplicated: Secondary | ICD-10-CM

## 2019-05-21 DIAGNOSIS — J4551 Severe persistent asthma with (acute) exacerbation: Secondary | ICD-10-CM

## 2019-05-21 NOTE — Assessment & Plan Note (Signed)
   Continue Fasenra injections as prescribed.  For now continue Advair 250 g, 1 inhalations twice daily, montelukast 10 mg daily at bedtime, and albuterol every 4-6 hours if needed.  To maximize pulmonary deposition, a spacer has been provided along with instructions for its proper administration with an HFA inhaler.  Subjective and objective measures of pulmonary function will be followed and the treatment plan will be adjusted accordingly.

## 2019-05-21 NOTE — Assessment & Plan Note (Signed)
   Continue appropriate aeroallergen avoidance measures.  A prescription has been provided for ipratropium nasal spray, 1-2 sprays per nostril 2 times daily as needed.  Continue azelastine nasal spray, 1 to 2 sprays per nostril twice daily, and montelukast 10 mg daily at bedtime.  For thick post nasal drainage, add guaifenesin [phone] mg (Mucinex)  twice daily as needed with adequate hydration as discussed.

## 2019-05-21 NOTE — Progress Notes (Signed)
Follow-up Note  RE: Elaine Lee MRN: [id number] DOB: 10-19-1945 Date of Office Visit: 05/21/2019  Primary care provider: Jethro Bastos, MD Referring provider: Jethro Bastos, MD  History of present illness: Elaine Lee is a 74 y.o. female with severe persistent asthma, mixed rhinitis, gastroesophageal reflux disease, and history of persistent cough presenting today for follow-up.  She reports that recently she has been experiencing frequent coughing.  The cough is productive of clear mucus.  She has not been experiencing fevers or chills.  She was started on a prednisone taper this past Friday.  She believes that the cough is due to thick postnasal drainage.  In addition, she states that she has acid reflux "constantly" despite compliance with pantoprazole daily and famotidine twice daily.  She reports that she has never been evaluated by a gastroenterologist.  She is attempting to control the postnasal drainage with azelastine nasal spray 1-2 times per day and Mucinex.  Regarding asthma, she requires albuterol rescue 2-3 times per week on average.  She has not required any trips to the ER or urgent care in the interval since her previous visit.  She is currently receiving Fasenra injections as prescribed and taking Advair 2 50-50 g, 1 inhalation twice daily, montelukast 10 mg daily, and Spiriva 1.25 g, 2 inhalations daily.  Assessment and plan: Severe persistent asthma  Continue Fasenra injections as prescribed.  For now continue Advair 250 g, 1 inhalations twice daily, montelukast 10 mg daily at bedtime, and albuterol every 4-6 hours if needed.  To maximize pulmonary deposition, a spacer has been provided along with instructions for its proper administration with an HFA inhaler.  Subjective and objective measures of pulmonary function will be followed and the treatment plan will be adjusted accordingly.  Allergic rhinitis with a predominantly nonallergic  component  Continue appropriate aeroallergen avoidance measures.  A prescription has been provided for ipratropium nasal spray, 1-2 sprays per nostril 2 times daily as needed.  Continue azelastine nasal spray, 1 to 2 sprays per nostril twice daily, and montelukast 10 mg daily at bedtime.  For thick post nasal drainage, add guaifenesin [phone] mg (Mucinex)  twice daily as needed with adequate hydration as discussed.  Gastroesophageal reflux disease Currently with suboptimal control.  Continue appropriate reflux lifestyle modifications, pantoprazole 40 mg daily as prescribed, and famotidine (Pepcid 20 mg) twice daily.  I recommend referral to gastroenterology for further evaluation/treatment this problem.  Cough, persistent Multifactorial.  The patient's history suggests primary contribution from postnasal drainage and acid reflux.  Treatment plan as outlined above.   Diagnostics: From today reveals an FVC of 1.78 L (62% predicted) and an FEV1 of 1.35 L (62% predicted) with an FEV1 ratio of 101%.  Moderate restrictive pattern with some contribution most likely from body habitus.  FEV1 is significantly improved compared with previous study.    Physical examination: Blood pressure 130/60, pulse 77, temperature 98.3 F (36.8 C), temperature source Oral, resp. rate 20, SpO2 96 %.  General: Alert, interactive, in no acute distress. HEENT: TMs pearly gray, turbinates mildly edematous without discharge, post-pharynx moderately erythematous. Neck: Supple without lymphadenopathy. Lungs: Mildly decreased breath sounds bilaterally without wheezing, rhonchi or rales. CV: Normal S1, S2 without murmurs. Skin: Warm and dry, without lesions or rashes.  The following portions of the patient's history were reviewed and updated as appropriate: allergies, current medications, past family history, past medical history, past social history, past surgical history and problem list.  Current Outpatient  Medications  Medication Sig Dispense Refill  . acetaminophen (TYLENOL) 500 MG tablet Take 500 mg by mouth every 6 (six) hours as needed.    Marland Kitchen albuterol (ACCUNEB) 0.63 MG/3ML nebulizer solution Take 1 ampule by nebulization 3 (three) times daily as needed for wheezing or shortness of breath.    Marland Kitchen albuterol (VENTOLIN HFA) 108 (90 Base) MCG/ACT inhaler Inhale 1-2 puffs into the lungs every 6 (six) hours as needed for wheezing or shortness of breath.    Marland Kitchen atorvastatin (LIPITOR) 10 MG tablet Take 10 mg by mouth at bedtime.     . budesonide (PULMICORT) 0.5 MG/2ML nebulizer solution Take 2 mLs (0.5 mg total) by nebulization in the morning and at bedtime. 120 mL 5  . Cholecalciferol (VITAMIN D3) 125 MCG (5000 UT) CAPS Take 5,000 Units by mouth daily.    . citalopram (CELEXA) 20 MG tablet Take 20 mg by mouth daily.    Marland Kitchen diltiazem (CARDIZEM CD) 240 MG 24 hr capsule Take 300 mg by mouth daily. For blood pressure    . famotidine (PEPCID) 20 MG tablet Take 20 mg by mouth 2 (two) times daily.    . Fluticasone-Salmeterol (ADVAIR) 250-50 MCG/DOSE AEPB Inhale 1 puff into the lungs 2 (two) times daily.    . furosemide (LASIX) 40 MG tablet Take 40 mg by mouth daily.     Marland Kitchen gabapentin (NEURONTIN) 600 MG tablet Take 1 tablet (600 mg total) by mouth 2 (two) times daily. (Patient taking differently: Take 600-1,200 mg by mouth See admin instructions. Take 600 mg by mouth in the morning and take 1200 mg by mouth at bedtime) 60 tablet 2  . guaiFENesin (MUCINEX) 600 MG 12 hr tablet Take 600 mg by mouth 2 (two) times daily.    Marland Kitchen liraglutide (VICTOZA) 18 MG/3ML SOPN Inject 1.8 mg into the skin daily. For blood sugar control    . loratadine (CLARITIN) 10 MG tablet Take 10 mg by mouth every morning.    Marland Kitchen losartan (COZAAR) 100 MG tablet Take 100 mg by mouth daily.    . melatonin 5 MG TABS Take 5 mg by mouth at bedtime.    . Menthol, Topical Analgesic, (BIOFREEZE) 4 % GEL Apply topically. Apply topically as needed to painful  joints    . mirabegron ER (MYRBETRIQ) 25 MG TB24 tablet Take 50 mg by mouth daily. For bladder control    . montelukast (SINGULAIR) 10 MG tablet Take 1 tablet (10 mg total) by mouth at bedtime. 30 tablet 5  . Mouthwashes (BIOTENE DRY MOUTH GENTLE) LIQD Use as directed in the mouth or throat. 1 cap full Q2 hr PRN for dry mouth    . nystatin (NYSTATIN) powder Apply 1 g topically 4 (four) times daily as needed (for skin folds).     . pantoprazole (PROTONIX) 40 MG tablet Take 1 tablet (40 mg total) by mouth daily. 30 tablet 11  . polyethylene glycol (MIRALAX / GLYCOLAX) packet Take 17 g by mouth daily. (Patient taking differently: Take 17 g by mouth daily as needed for moderate constipation. ) 14 each 0  . rivaroxaban (XARELTO) 20 MG TABS tablet Take 20 mg by mouth daily with supper.    Marland Kitchen rOPINIRole (REQUIP) 0.5 MG tablet Take 1 mg by mouth at bedtime.    Marland Kitchen tetrahydrozoline (VISINE) 0.05 % ophthalmic solution Place 1 drop into both eyes 4 (four) times daily as needed (for eye irritation).     . Tiotropium Bromide Monohydrate (SPIRIVA RESPIMAT) 1.25 MCG/ACT AERS Inhale 2 puffs into the  lungs daily.    . traMADol (ULTRAM) 50 MG tablet Take 50 mg by mouth every 6 (six) hours as needed.    Marland Kitchen azelastine (ASTELIN) 0.1 % nasal spray Place 2 sprays into both nostrils 2 (two) times daily. Use in each nostril as directed 30 mL 12  . Azelastine-Fluticasone 137-50 MCG/ACT SUSP Place 1-2 sprays into the nose 2 (two) times daily as needed. (Patient not taking: Reported on 04/08/2019) 23 g 5   Current Facility-Administered Medications  Medication Dose Route Frequency Provider Last Rate Last Admin  . Benralizumab SOSY 30 mg  30 mg Subcutaneous Q14 Days Kailynne Ferrington, [place] Muta, MD   30 mg at 05/21/19 1113    Allergies  Allergen Reactions  . Other Shortness Of Breath and Other (See Comments)    ROSEMARY AND HEAVY SCENTED PERFUMES, DETERGENTS, ETC  . Tape Itching, Rash and Other (See Comments)    Burns and blisters  USE PAPER TAPE  . Trazodone And Nefazodone Shortness Of Breath and Swelling  . Celebrex [Celecoxib] Other (See Comments)    HARD TIME HEARING, LIKE BEING UNDERWATER  . Latex Hives, Itching and Rash  . Mysoline [Primidone] Other (See Comments)    unknown  . Niacin And Related Hives and Swelling  . Sulfa Antibiotics Other (See Comments)    TONGUE BLISTERS    . Aspirin Other (See Comments)    GI PROBLEMS AND PAIN   Review of systems: Review of systems negative except as noted in HPI / PMHx.  Past Medical History:  Diagnosis Date  . A-fib ([place])   . Asthma   . CHF (congestive heart failure) ([place])   . Chronic edema   . CKD (chronic kidney disease), stage III   . COPD (chronic obstructive pulmonary disease) ([place])   . Degenerative lumbar disc   . Depression   . Diabetes mellitus without complication ([place])   . Diabetic neuropathy ([place])   . GERD (gastroesophageal reflux disease)   . Gout   . Hearing loss   . Hypercholesteremia   . Hypertension   . Insomnia   . Obstructive sleep apnea on CPAP   . Osteoarthritis    knees and shoulders  . Renal disorder   . Urinary frequency     Family History  Problem Relation Age of Onset  . Allergies Mother   . Rheumatologic disease Mother   . Cancer Mother        leukemia  . Allergies Father   . Asthma Father   . Heart disease Father   . Rheumatologic disease Father   . Cancer Father        leukemia  . Allergies Daughter   . Cancer Sister   . Breast cancer Maternal Aunt   . Breast cancer Paternal Aunt   . Breast cancer Maternal Aunt   . Allergic rhinitis Neg Hx   . Eczema Neg Hx   . Urticaria Neg Hx     Social History   Socioeconomic History  . Marital status: Married    Spouse name: Not on file  . Number of children: Not on file  . Years of education: Not on file  . Highest education level: Not on file  Occupational History  . Not on file  Tobacco Use  . Smoking status: Never Smoker  . Smokeless tobacco: Never Used    Substance and Sexual Activity  . Alcohol use: No    Alcohol/week: 0.0 standard drinks  . Drug use: Never  . Sexual activity: Not on  file  Other Topics Concern  . Not on file  Social History Narrative  . Not on file   Social Determinants of Health   Financial Resource Strain:   . Difficulty of Paying Living Expenses:   Food Insecurity:   . Worried About Programme researcher, broadcasting/film/video in the Last Year:   . Barista in the Last Year:   Transportation Needs:   . Freight forwarder (Medical):   Marland Kitchen Lack of Transportation (Non-Medical):   Physical Activity:   . Days of Exercise per Week:   . Minutes of Exercise per Session:   Stress:   . Feeling of Stress :   Social Connections:   . Frequency of Communication with Friends and Family:   . Frequency of Social Gatherings with Friends and Family:   . Attends Religious Services:   . Active Member of Clubs or Organizations:   . Attends Banker Meetings:   Marland Kitchen Marital Status:   Intimate Partner Violence:   . Fear of Current or Ex-Partner:   . Emotionally Abused:   Marland Kitchen Physically Abused:   . Sexually Abused:     I appreciate the opportunity to take part in Nikkol's care. Please do not hesitate to contact me with questions.  Sincerely,   R. Jorene Guest, MD

## 2019-05-21 NOTE — Patient Instructions (Addendum)
Severe persistent asthma  Continue Fasenra injections as prescribed.  For now continue Advair 250 g, 1 inhalations twice daily, montelukast 10 mg daily at bedtime, and albuterol every 4-6 hours if needed.  To maximize pulmonary deposition, a spacer has been provided along with instructions for its proper administration with an HFA inhaler.  Subjective and objective measures of pulmonary function will be followed and the treatment plan will be adjusted accordingly.  Allergic rhinitis with a predominantly nonallergic component  Continue appropriate aeroallergen avoidance measures.  A prescription has been provided for ipratropium nasal spray, 1-2 sprays per nostril 2 times daily as needed.  Continue azelastine nasal spray, 1 to 2 sprays per nostril twice daily, and montelukast 10 mg daily at bedtime.  For thick post nasal drainage, add guaifenesin [phone] mg (Mucinex)  twice daily as needed with adequate hydration as discussed.  Gastroesophageal reflux disease Currently with suboptimal control.  Continue appropriate reflux lifestyle modifications, pantoprazole 40 mg daily as prescribed, and famotidine (Pepcid 20 mg) twice daily.  I recommend referral to gastroenterology for further evaluation/treatment this problem.  Cough, persistent Multifactorial.  The patient's history suggests primary contribution from postnasal drainage and acid reflux.  Treatment plan as outlined above.   Return in about 4 months (around 09/21/2019), or if symptoms worsen or fail to improve.

## 2019-05-21 NOTE — Assessment & Plan Note (Signed)
Currently with suboptimal control.  Continue appropriate reflux lifestyle modifications, pantoprazole 40 mg daily as prescribed, and famotidine (Pepcid 20 mg) twice daily.  I recommend referral to gastroenterology for further evaluation/treatment this problem.

## 2019-05-21 NOTE — Assessment & Plan Note (Signed)
Multifactorial.  The patient's history suggests primary contribution from postnasal drainage and acid reflux.  Treatment plan as outlined above. 

## 2019-05-22 NOTE — Addendum Note (Signed)
Addended by: Berna Bue on: 05/22/2019 12:44 PM   Modules accepted: Orders

## 2019-06-02 ENCOUNTER — Ambulatory Visit: Payer: Self-pay

## 2019-06-07 IMAGING — DX DG CHEST 1V PORT
1 series · 1 of 1 positions shown · non-contrast
Comparison: 06/25/2017

CLINICAL DATA: Shortness of breath

EXAM:
PORTABLE CHEST 1 VIEW

[chest ap]
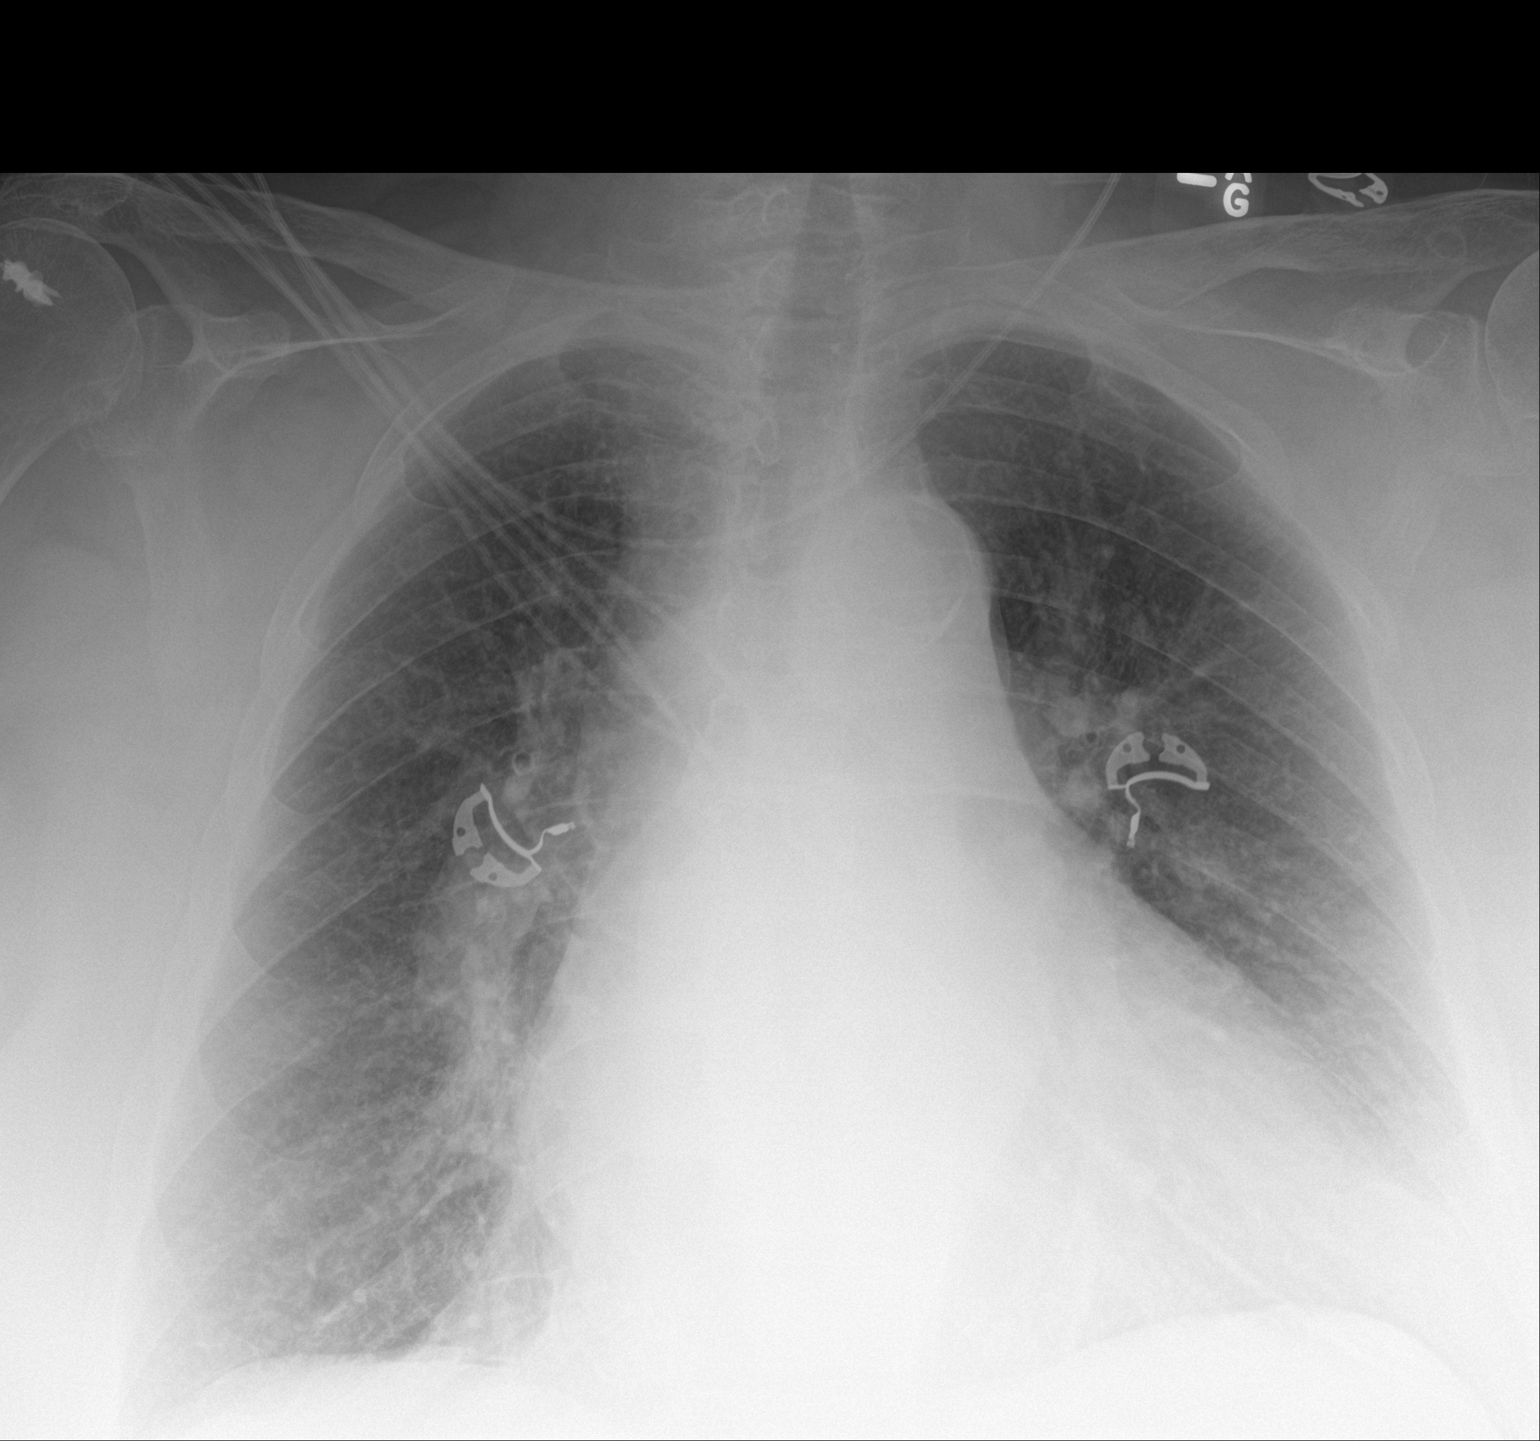

[1 of 1 positions shown; findings below may reference images not displayed]

FINDINGS: Cardiac shadow remains enlarged. The lungs are well aerated
bilaterally. Improved aeration is noted in the right lung base. Some
minimal residual density is noted medially. No other focal
abnormality is seen.
IMPRESSION: Significant improved aeration in the right middle lobe. Some mild
residual infiltrate is noted. The previously seen changes may have
been related to mucous plugging.

## 2019-06-09 ENCOUNTER — Ambulatory Visit (INDEPENDENT_AMBULATORY_CARE_PROVIDER_SITE_OTHER): Payer: Medicare (Managed Care) | Admitting: Critical Care Medicine

## 2019-06-09 ENCOUNTER — Encounter: Payer: Self-pay | Admitting: Critical Care Medicine

## 2019-06-09 ENCOUNTER — Other Ambulatory Visit: Payer: Self-pay

## 2019-06-09 VITALS — BP 124/60 | HR 77 | Temp 98.1°F | Ht 65.0 in | Wt 282.0 lb

## 2019-06-09 DIAGNOSIS — J309 Allergic rhinitis, unspecified: Secondary | ICD-10-CM | POA: Diagnosis not present

## 2019-06-09 DIAGNOSIS — J4550 Severe persistent asthma, uncomplicated: Secondary | ICD-10-CM | POA: Diagnosis not present

## 2019-06-09 NOTE — Progress Notes (Signed)
Synopsis: Referred in September 2016 for asthma, OHS, OSA by Jethro Bastos, MD.  Previously patient of Dr. Craige Cotta.  Subjective:   PATIENT ID: Elaine Lee GENDER: female DOB: [birth year], MRN: [id number]  Chief Complaint  Patient presents with  . Follow-up    Patient states she started wheezing and coughing a couple weeks ago. Feels like it slowly started back after finishing prednisone. Patient has not used nebs last 2 days is not sure if they make a difference but she normally uses them everyday.     Elaine Lee is a 74 y/o woman with a history of severe persistent allergic asthma who presents for follow up. She remains on advair bid, budesonide nebs BID, singulair, claritin, azelastine nasal spray. Dr. Nunzio Cobbs (5/20 note reviewed) recently added ipratropium nasal spray for ongoing productive cough. Last fasenra injection in late May; now receiving every other month at Dr. Sheran Fava office. She continues to follow up with her PCP at [facility]. She received another dose of prednisone in May and has had recurrent symptoms of wheezing and cough again since being off prednisone. She seldom uses her albuterol.  She is planning on moving out of her home and into a senior living community. She is hopeful that this will improve her allergies. She overall feels that her asthma is better because we have been able to avoid hospitalizations.      OV 04/08/19: Elaine Lee is a 74 y/o woman with severe persistent asthma who presents for follow up. Since her last visit she completed her course of Augmentin and has been on prednisone 10 mg daily.  Her symptoms are significantly improved.  She is no longer wheezing, sputum production has resolved since completing Augmentin.  Much less coughing.  She would like to be able to stop prednisone due to weight gain.  She had her second Fasenra injection last week.  Her next Harrington Challenger injection is in June.  She is continued on her Advair and Spiriva  inhalers.  She continues to use azelastine nasal spray.  She remains on Singulair and Claritin daily.  She feels that she is doing pretty well for this time of year did not be admitted to the hospital.   OV 03/19/19: Elaine Lee is a 74 year old woman with a history of severe persistent allergic asthma who presents for follow-up.  She was started on prednisone 10 mg daily due to persistent uncontrolled symptoms last week.  Her symptoms are stable or mildly improved.  She feels like catching up and started on prednisone they would have continued worsening as they have in the past.  Her next Fasenra injection is later this month.  She remains on Spiriva, high-dose Advair plus budesonide nebs, montelukast, Claritin, and azelastine nasal spray.  Not been using Flonase.  Her heartburn is improved.   OV 03/10/19: Elaine Lee is a 74 y/o woman with a history of severe persistent asthma.  She has required hospitalizations 3 times in the last several months, most recently overnight on February 15, 2019.  She was discharged on prednisone.  She followed up with her allergist, Dr. Katina Dung, and her initiation of Harrington Challenger was delayed due to asthma exacerbation.  At that time she was started on prednisone and inhaled budesonide in addition to high-dose Advair and Spiriva.  She continues to take montelukast daily, Flonase twice daily, Mucinex twice daily, and Claritin once daily.  She has not been using her flutter valve.  She is using albuterol nebs 4 times  daily, but no longer using saline sinus rinses.  She last finished prednisone on 03/06/2019, with no change in symptoms since then.  She continues to have frequent coughing with occasional yellow sputum production.  She is constantly short of breath, worse with exertion.  She has frequent wheezing.  She has walked with her pulse oximeter at home, dropped as low as 86%, but quickly recovers into the 90s.  She was able to receive her first dose of Fasenra on  03/02/2019 without problems.  Her allergist has escalated her GERD regimen to include both a PPI and famotidine.     OV12/01/2018: Elaine Lee is a 74 year old woman with a history of allergies, asthma and OSA on CPAP.  She formally had OHS requiring home oxygen, but has not needed for the past 2 years after losing weight.  Her asthma has been worse recently.  She has been hospitalized 2 times in the past few months at [facility].  She has been following up with her allergist Dr. Verlin Fester, who is planning on starting her on an anti-IL-5 biologic in the future.  He added Spiriva to her regimen at her last visit, which she thinks is helping.  She remains on Advair 250-50, montelukast, Claritin, and Flonase nasal spray.  She has been using her nebulizer less in the last few weeks since recovering from her exacerbation-twice per day in the morning and before bed.  Due to nocturnal symptoms she has not been using her CPAP at night.  Her symptoms are exacerbated by strong smells, and when her allergies are worse, commonly this time of year.  Wheezing is her main complaint.  Her nonproductive cough has caused her to have rib pain, but overall her cough is improving since hospitalization.  She has 1 pet dog, but denies a history of allergies to dogs.  She is concerned that she has mold in a closet from a previous leak in her roof that she has not been able to have fixed yet.  Breathing is a contributor to her activity limitation, but she also attributes hip and back pain.   She has a history of chronic GERD that is uncontrolled.  She is taking Pepcid twice daily and has never been on a PPI.  She has frequent belching and throat clearing.  She avoids spicy foods, eating mostly bland foods, which helps.  She does not drink caffeine, but frequently drinks carbonated beverages and eats chocolate.  She has struggled more with her depression this year.  [facility], but has  been unable to go since it has been closed due to COVID-19.  Her husband passed away suddenly in the spring.  She now lives with her son, but has stress due to his history of drug use.  While she was recently hospitalized she had a CT scan which demonstrated a pleural effusion.     Past Medical History:  Diagnosis Date  . A-fib ([place])   . Asthma   . CHF (congestive heart failure) ([place])   . Chronic edema   . CKD (chronic kidney disease), stage III   . COPD (chronic obstructive pulmonary disease) ([place])   . Degenerative lumbar disc   . Depression   . Diabetes mellitus without complication ([place])   . Diabetic neuropathy ([place])   . GERD (gastroesophageal reflux disease)   . Gout   . Hearing loss   . Hypercholesteremia   . Hypertension   . Insomnia   .  Obstructive sleep apnea on CPAP   . Osteoarthritis    knees and shoulders  . Renal disorder   . Urinary frequency      Family History  Problem Relation Age of Onset  . Allergies Mother   . Rheumatologic disease Mother   . Cancer Mother        leukemia  . Allergies Father   . Asthma Father   . Heart disease Father   . Rheumatologic disease Father   . Cancer Father        leukemia  . Allergies Daughter   . Cancer Sister   . Breast cancer Maternal Aunt   . Breast cancer Paternal Aunt   . Breast cancer Maternal Aunt   . Allergic rhinitis Neg Hx   . Eczema Neg Hx   . Urticaria Neg Hx      Past Surgical History:  Procedure Laterality Date  . ABDOMINAL HYSTERECTOMY    . KNEE ARTHROPLASTY Right 12/05/2017   Procedure: COMPUTER ASSISTED TOTAL KNEE ARTHROPLASTY;  Surgeon: Samson FredericSwinteck, Brian, MD;  Location: [facility];  Service: Orthopedics;  Laterality: Right;  . ROTATOR CUFF REPAIR  1980   x3  . TONSILLECTOMY      Social History   Socioeconomic History  . Marital status: Married    Spouse name: Not on file  . Number of children: Not on file  . Years of education: Not on file  . Highest education level: Not on file    Occupational History  . Not on file  Tobacco Use  . Smoking status: Never Smoker  . Smokeless tobacco: Never Used  Substance and Sexual Activity  . Alcohol use: No    Alcohol/week: 0.0 standard drinks  . Drug use: Never  . Sexual activity: Not on file  Other Topics Concern  . Not on file  Social History Narrative  . Not on file   Social Determinants of Health   Financial Resource Strain:   . Difficulty of Paying Living Expenses:   Food Insecurity:   . Worried About Programme researcher, broadcasting/film/videounning Out of Food in the Last Year:   . Baristaan Out of Food in the Last Year:   Transportation Needs:   . Freight forwarderLack of Transportation (Medical):   Marland Kitchen. Lack of Transportation (Non-Medical):   Physical Activity:   . Days of Exercise per Week:   . Minutes of Exercise per Session:   Stress:   . Feeling of Stress :   Social Connections:   . Frequency of Communication with Friends and Family:   . Frequency of Social Gatherings with Friends and Family:   . Attends Religious Services:   . Active Member of Clubs or Organizations:   . Attends BankerClub or Organization Meetings:   Marland Kitchen. Marital Status:   Intimate Partner Violence:   . Fear of Current or Ex-Partner:   . Emotionally Abused:   Marland Kitchen. Physically Abused:   . Sexually Abused:      Allergies  Allergen Reactions  . Other Shortness Of Breath and Other (See Comments)    ROSEMARY AND HEAVY SCENTED PERFUMES, DETERGENTS, ETC  . Tape Itching, Rash and Other (See Comments)    Burns and blisters USE PAPER TAPE  . Trazodone And Nefazodone Shortness Of Breath and Swelling  . Celebrex [Celecoxib] Other (See Comments)    HARD TIME HEARING, LIKE BEING UNDERWATER  . Latex Hives, Itching and Rash  . Mysoline [Primidone] Other (See Comments)    unknown  . Niacin And Related Hives and Swelling  .  Sulfa Antibiotics Other (See Comments)    TONGUE BLISTERS    . Aspirin Other (See Comments)    GI PROBLEMS AND PAIN     Immunization History  Administered Date(s) Administered  . Fluad  Quad(high Dose 65+) 04/02/2018, 12/02/2018  . Influenza,inj,Quad PF,6+ Mos 10/04/2014  . Moderna SARS-COVID-2 Vaccination 03/31/2019, 04/29/2019    Outpatient Medications Prior to Visit  Medication Sig Dispense Refill  . acetaminophen (TYLENOL) 500 MG tablet Take 500 mg by mouth every 6 (six) hours as needed.    Marland Kitchen albuterol (ACCUNEB) 0.63 MG/3ML nebulizer solution Take 1 ampule by nebulization 3 (three) times daily as needed for wheezing or shortness of breath.    Marland Kitchen albuterol (VENTOLIN HFA) 108 (90 Base) MCG/ACT inhaler Inhale 1-2 puffs into the lungs every 6 (six) hours as needed for wheezing or shortness of breath.    Marland Kitchen atorvastatin (LIPITOR) 10 MG tablet Take 10 mg by mouth at bedtime.     Marland Kitchen azelastine (ASTELIN) 0.1 % nasal spray Place 2 sprays into both nostrils 2 (two) times daily. Use in each nostril as directed 30 mL 12  . budesonide (PULMICORT) 0.5 MG/2ML nebulizer solution Take 2 mLs (0.5 mg total) by nebulization in the morning and at bedtime. 120 mL 5  . Cholecalciferol (VITAMIN D3) 125 MCG (5000 UT) CAPS Take 5,000 Units by mouth daily.    . citalopram (CELEXA) 20 MG tablet Take 20 mg by mouth daily.    Marland Kitchen diltiazem (CARDIZEM CD) 240 MG 24 hr capsule Take 300 mg by mouth daily. For blood pressure    . famotidine (PEPCID) 20 MG tablet Take 20 mg by mouth 2 (two) times daily.    . Fluticasone-Salmeterol (ADVAIR) 250-50 MCG/DOSE AEPB Inhale 1 puff into the lungs 2 (two) times daily.    . furosemide (LASIX) 40 MG tablet Take 40 mg by mouth daily.     Marland Kitchen gabapentin (NEURONTIN) 600 MG tablet Take 1 tablet (600 mg total) by mouth 2 (two) times daily. (Patient taking differently: Take 600-1,200 mg by mouth See admin instructions. Take 600 mg by mouth in the morning and take 1200 mg by mouth at bedtime) 60 tablet 2  . guaiFENesin (MUCINEX) 600 MG 12 hr tablet Take 600 mg by mouth 2 (two) times daily.    Marland Kitchen liraglutide (VICTOZA) 18 MG/3ML SOPN Inject 1.8 mg into the skin daily. For blood sugar  control    . loratadine (CLARITIN) 10 MG tablet Take 10 mg by mouth every morning.    Marland Kitchen losartan (COZAAR) 100 MG tablet Take 100 mg by mouth daily.    . melatonin 5 MG TABS Take 5 mg by mouth at bedtime.    . Menthol, Topical Analgesic, (BIOFREEZE) 4 % GEL Apply topically. Apply topically as needed to painful joints    . mirabegron ER (MYRBETRIQ) 25 MG TB24 tablet Take 50 mg by mouth daily. For bladder control    . montelukast (SINGULAIR) 10 MG tablet Take 1 tablet (10 mg total) by mouth at bedtime. 30 tablet 5  . Mouthwashes (BIOTENE DRY MOUTH GENTLE) LIQD Use as directed in the mouth or throat. 1 cap full Q2 hr PRN for dry mouth    . nystatin (NYSTATIN) powder Apply 1 g topically 4 (four) times daily as needed (for skin folds).     . pantoprazole (PROTONIX) 40 MG tablet Take 1 tablet (40 mg total) by mouth daily. 30 tablet 11  . polyethylene glycol (MIRALAX / GLYCOLAX) packet Take 17 g by mouth daily. (  Patient taking differently: Take 17 g by mouth daily as needed for moderate constipation. ) 14 each 0  . rivaroxaban (XARELTO) 20 MG TABS tablet Take 20 mg by mouth daily with supper.    Marland Kitchen rOPINIRole (REQUIP) 0.5 MG tablet Take 1 mg by mouth at bedtime.    Marland Kitchen tetrahydrozoline (VISINE) 0.05 % ophthalmic solution Place 1 drop into both eyes 4 (four) times daily as needed (for eye irritation).     . Tiotropium Bromide Monohydrate (SPIRIVA RESPIMAT) 1.25 MCG/ACT AERS Inhale 2 puffs into the lungs daily.    . traMADol (ULTRAM) 50 MG tablet Take 50 mg by mouth every 6 (six) hours as needed.    . Azelastine-Fluticasone 137-50 MCG/ACT SUSP Place 1-2 sprays into the nose 2 (two) times daily as needed. 23 g 5   Facility-Administered Medications Prior to Visit  Medication Dose Route Frequency Provider Last Rate Last Admin  . Benralizumab SOSY 30 mg  30 mg Subcutaneous Q14 Days Bobbitt, Heywood Iles, MD   30 mg at 05/21/19 1113    Review of Systems  Constitutional: Negative for chills, fever and weight  loss.  HENT: Positive for congestion.        Postnasal drip  Eyes: Negative.   Respiratory: Positive for cough, shortness of breath and wheezing. Negative for sputum production.   Cardiovascular: Negative for chest pain and leg swelling.  Gastrointestinal: Positive for heartburn. Negative for abdominal pain, diarrhea, nausea and vomiting.  Genitourinary: Negative.   Musculoskeletal: Negative.   Skin: Negative for rash.  Neurological: Negative for weakness and headaches.  Endo/Heme/Allergies: Positive for environmental allergies.  Psychiatric/Behavioral: Positive for depression. The patient is nervous/anxious.      Objective:   Vitals:   06/09/19 1044  BP: 124/60  Pulse: 77  Temp: 98.1 F (36.7 C)  TempSrc: Oral  SpO2: 93%  Weight: 282 lb (127.9 kg)  Height: 5\' 5"  (1.651 m)   93% on   RA BMI Readings from Last 3 Encounters:  06/09/19 46.93 kg/m  04/08/19 45.46 kg/m  03/19/19 44.40 kg/m   Wt Readings from Last 3 Encounters:  06/09/19 282 lb (127.9 kg)  04/08/19 273 lb 3.2 oz (123.9 kg)  03/19/19 266 lb 12.8 oz (121 kg)    Physical Exam Vitals reviewed.  Constitutional:      General: She is not in acute distress.    Appearance: Normal appearance. She is obese.  HENT:     Head: Normocephalic and atraumatic.  Eyes:     General: No scleral icterus. Cardiovascular:     Rate and Rhythm: Normal rate and regular rhythm.     Heart sounds: No murmur.  Pulmonary:     Comments: CTAB, forced exhalation. Breathing comfortably on RA. Musculoskeletal:     Cervical back: Neck supple.  Lymphadenopathy:     Cervical: No cervical adenopathy.  Skin:    General: Skin is warm and dry.     Findings: No rash.  Neurological:     General: No focal deficit present.     Mental Status: She is alert.     Coordination: Coordination normal.  Psychiatric:        Mood and Affect: Mood normal.        Behavior: Behavior normal.      CBC    Component Value Date/Time   WBC 9.2  11/07/2018 1655   WBC 10.8 (H) 12/08/2017 0439   RBC 3.92 11/07/2018 1655   RBC 3.08 (L) 12/08/2017 0439   HGB 11.2 11/07/2018 1655  HCT 33.7 (L) 11/07/2018 1655   PLT 209 11/07/2018 1655   MCV 86 11/07/2018 1655   MCH 28.6 11/07/2018 1655   MCH 28.2 12/08/2017 0439   MCHC 33.2 11/07/2018 1655   MCHC 31.4 12/08/2017 0439   RDW 13.1 11/07/2018 1655   LYMPHSABS 1.8 11/07/2018 1655   MONOABS 0.4 06/26/2017 0614   EOSABS 0.5 (H) 11/07/2018 1655   BASOSABS 0.0 11/07/2018 1655    CHEMISTRY No results for input(s): NA, K, CL, CO2, GLUCOSE, BUN, CREATININE, CALCIUM, MG, PHOS in the last 168 hours. CrCl cannot be calculated (Patient's most recent lab result is older than the maximum 21 days allowed.).  Alpha-1 antitrypsin phenotype MM, 146 Alpha gal panel negative IgE 247 Allergy panel negative (per Dr. Sheran Fava notes, skin testing was positive for major mold mix #4)  Chest Imaging- films reviewed: CXR, 2 view 11/22/2017-prominent hila bilaterally, airway thickening.  Loss of AP window on the left.  CT chest 02/20/2019- upper lobe predominant air trapping, RML nodule suspicious for mucus plugging.   Pulmonary Functions Testing Results: No flowsheet data found.  11/06/2018 office spirometry: FVC 1.64 (56%)--> 1.48 (51%) , -10% FEV1 1.2 (55%) --> 1.04 (47%), -13% Ratio 0.73--> 0.70 Expiratory loop consistent with obstruction   Echocardiogram 04/06/2016: LVEF 60 to 65%, indeterminate diastolic function.  Mildly dilated LA, normal RV, mildly dilated RA.  Enlarged IVC with reduced changes with respiration.  Mild TR, otherwise normal valves.      Assessment & Plan:     ICD-10-CM   1. Severe persistent asthma without complication  J45.50   2. Allergic rhinitis, unspecified seasonality, unspecified trigger  J30.9     Severe persistent allergic asthma- relatively stable interval, but her symptoms remain challenging to control. Elevated IgE & eosinophils. -Continue high-dose Advair  and budesonide nebs BID. -Continue Spiriva daily. -Continue montelukast and loratadine daily. -Continue azelastine and ipratropium nasal sprays twice daily. -Agree with Fasenra injections- Appreciate Dr. Sheran Fava assistance. -Continue aggressive GERD management *Prescriptions always sent to to [phone], [facility]  Allergic rhinosinusitis -Continue loratadine, montelukast, azelastine & ipratropium nasal sprays  Abnormal chest x-ray in 2019 with increased left hilar prominence.  Reported history of recent pleural effusion. -No additional follow-up needed based on CT results  RTC in 3 months.  Follow up with PCP at [facility], who can continue meds that have been prescribed to her pharmacy there.    Current Outpatient Medications:  .  acetaminophen (TYLENOL) 500 MG tablet, Take 500 mg by mouth every 6 (six) hours as needed., Disp: , Rfl:  .  albuterol (ACCUNEB) 0.63 MG/3ML nebulizer solution, Take 1 ampule by nebulization 3 (three) times daily as needed for wheezing or shortness of breath., Disp: , Rfl:  .  albuterol (VENTOLIN HFA) 108 (90 Base) MCG/ACT inhaler, Inhale 1-2 puffs into the lungs every 6 (six) hours as needed for wheezing or shortness of breath., Disp: , Rfl:  .  atorvastatin (LIPITOR) 10 MG tablet, Take 10 mg by mouth at bedtime. , Disp: , Rfl:  .  azelastine (ASTELIN) 0.1 % nasal spray, Place 2 sprays into both nostrils 2 (two) times daily. Use in each nostril as directed, Disp: 30 mL, Rfl: 12 .  budesonide (PULMICORT) 0.5 MG/2ML nebulizer solution, Take 2 mLs (0.5 mg total) by nebulization in the morning and at bedtime., Disp: 120 mL, Rfl: 5 .  Cholecalciferol (VITAMIN D3) 125 MCG (5000 UT) CAPS, Take 5,000 Units by mouth daily., Disp: , Rfl:  .  citalopram (CELEXA) 20 MG  tablet, Take 20 mg by mouth daily., Disp: , Rfl:  .  diltiazem (CARDIZEM CD) 240 MG 24 hr capsule, Take 300 mg by mouth daily. For blood pressure, Disp: , Rfl:  .  famotidine (PEPCID) 20 MG tablet,  Take 20 mg by mouth 2 (two) times daily., Disp: , Rfl:  .  Fluticasone-Salmeterol (ADVAIR) 250-50 MCG/DOSE AEPB, Inhale 1 puff into the lungs 2 (two) times daily., Disp: , Rfl:  .  furosemide (LASIX) 40 MG tablet, Take 40 mg by mouth daily. , Disp: , Rfl:  .  gabapentin (NEURONTIN) 600 MG tablet, Take 1 tablet (600 mg total) by mouth 2 (two) times daily. (Patient taking differently: Take 600-1,200 mg by mouth See admin instructions. Take 600 mg by mouth in the morning and take 1200 mg by mouth at bedtime), Disp: 60 tablet, Rfl: 2 .  guaiFENesin (MUCINEX) 600 MG 12 hr tablet, Take 600 mg by mouth 2 (two) times daily., Disp: , Rfl:  .  liraglutide (VICTOZA) 18 MG/3ML SOPN, Inject 1.8 mg into the skin daily. For blood sugar control, Disp: , Rfl:  .  loratadine (CLARITIN) 10 MG tablet, Take 10 mg by mouth every morning., Disp: , Rfl:  .  losartan (COZAAR) 100 MG tablet, Take 100 mg by mouth daily., Disp: , Rfl:  .  melatonin 5 MG TABS, Take 5 mg by mouth at bedtime., Disp: , Rfl:  .  Menthol, Topical Analgesic, (BIOFREEZE) 4 % GEL, Apply topically. Apply topically as needed to painful joints, Disp: , Rfl:  .  mirabegron ER (MYRBETRIQ) 25 MG TB24 tablet, Take 50 mg by mouth daily. For bladder control, Disp: , Rfl:  .  montelukast (SINGULAIR) 10 MG tablet, Take 1 tablet (10 mg total) by mouth at bedtime., Disp: 30 tablet, Rfl: 5 .  Mouthwashes (BIOTENE DRY MOUTH GENTLE) LIQD, Use as directed in the mouth or throat. 1 cap full Q2 hr PRN for dry mouth, Disp: , Rfl:  .  nystatin (NYSTATIN) powder, Apply 1 g topically 4 (four) times daily as needed (for skin folds). , Disp: , Rfl:  .  pantoprazole (PROTONIX) 40 MG tablet, Take 1 tablet (40 mg total) by mouth daily., Disp: 30 tablet, Rfl: 11 .  polyethylene glycol (MIRALAX / GLYCOLAX) packet, Take 17 g by mouth daily. (Patient taking differently: Take 17 g by mouth daily as needed for moderate constipation. ), Disp: 14 each, Rfl: 0 .  rivaroxaban (XARELTO) 20  MG TABS tablet, Take 20 mg by mouth daily with supper., Disp: , Rfl:  .  rOPINIRole (REQUIP) 0.5 MG tablet, Take 1 mg by mouth at bedtime., Disp: , Rfl:  .  tetrahydrozoline (VISINE) 0.05 % ophthalmic solution, Place 1 drop into both eyes 4 (four) times daily as needed (for eye irritation). , Disp: , Rfl:  .  Tiotropium Bromide Monohydrate (SPIRIVA RESPIMAT) 1.25 MCG/ACT AERS, Inhale 2 puffs into the lungs daily., Disp: , Rfl:  .  traMADol (ULTRAM) 50 MG tablet, Take 50 mg by mouth every 6 (six) hours as needed., Disp: , Rfl:   Current Facility-Administered Medications:  .  Benralizumab SOSY 30 mg, 30 mg, Subcutaneous, Q14 Days, Bobbitt, Heywood Iles, MD, 30 mg at 05/21/19 1113   Steffanie Dunn, DO [facility] 06/09/2019 11:23 AM

## 2019-06-09 NOTE — Patient Instructions (Addendum)
Thank you for visiting Dr. Chestine Spore at [facility]. We recommend the following:  Stay on all of your same medications: Budesonide nebulizers twice daily   (rinse your mouth after) Advair twice daily  (rinse your mouth after) Spiriva once daily Claritin once daily Singulair once daily Both nasap sprays twice daily- ipratropium and azelastine  Albuterol as needed.    Return in about 3 months (around 09/09/2019).    Please do your part to reduce the spread of COVID-19.

## 2019-06-19 ENCOUNTER — Other Ambulatory Visit: Payer: Self-pay

## 2019-06-19 ENCOUNTER — Ambulatory Visit (HOSPITAL_COMMUNITY)
Admission: RE | Admit: 2019-06-19 | Discharge: 2019-06-19 | Disposition: A | Discharge status: Home / Self Care | Payer: Medicare (Managed Care) | Source: Ambulatory Visit | Attending: Vascular Surgery | Admitting: Vascular Surgery

## 2019-06-19 ENCOUNTER — Other Ambulatory Visit (HOSPITAL_COMMUNITY): Payer: Self-pay | Admitting: Family Medicine

## 2019-06-19 DIAGNOSIS — M79662 Pain in left lower leg: Secondary | ICD-10-CM

## 2019-06-19 DIAGNOSIS — M7989 Other specified soft tissue disorders: Secondary | ICD-10-CM | POA: Diagnosis present

## 2019-07-13 ENCOUNTER — Ambulatory Visit
Admission: RE | Admit: 2019-07-13 | Discharge: 2019-07-13 | Disposition: A | Discharge status: Home / Self Care | Payer: Medicare (Managed Care) | Source: Ambulatory Visit | Attending: Nurse Practitioner | Admitting: Nurse Practitioner

## 2019-07-13 ENCOUNTER — Other Ambulatory Visit: Payer: Self-pay | Admitting: Nurse Practitioner

## 2019-07-13 DIAGNOSIS — M25551 Pain in right hip: Secondary | ICD-10-CM

## 2019-07-16 ENCOUNTER — Ambulatory Visit (INDEPENDENT_AMBULATORY_CARE_PROVIDER_SITE_OTHER): Payer: Medicare (Managed Care)

## 2019-07-16 ENCOUNTER — Other Ambulatory Visit: Payer: Self-pay

## 2019-07-16 DIAGNOSIS — J4551 Severe persistent asthma with (acute) exacerbation: Secondary | ICD-10-CM

## 2019-07-16 DIAGNOSIS — J4550 Severe persistent asthma, uncomplicated: Secondary | ICD-10-CM

## 2019-07-21 ENCOUNTER — Other Ambulatory Visit: Payer: Self-pay | Admitting: Nurse Practitioner

## 2019-07-21 DIAGNOSIS — R102 Pelvic and perineal pain: Secondary | ICD-10-CM

## 2019-07-23 ENCOUNTER — Other Ambulatory Visit: Payer: Medicare (Managed Care)

## 2019-07-24 ENCOUNTER — Ambulatory Visit
Admission: RE | Admit: 2019-07-24 | Discharge: 2019-07-24 | Disposition: A | Discharge status: Home / Self Care | Payer: No Typology Code available for payment source | Source: Ambulatory Visit | Attending: Nurse Practitioner | Admitting: Nurse Practitioner

## 2019-07-24 ENCOUNTER — Other Ambulatory Visit: Payer: Self-pay | Admitting: Nurse Practitioner

## 2019-07-24 DIAGNOSIS — M25551 Pain in right hip: Secondary | ICD-10-CM

## 2019-07-27 ENCOUNTER — Ambulatory Visit (INDEPENDENT_AMBULATORY_CARE_PROVIDER_SITE_OTHER): Payer: Medicare (Managed Care)

## 2019-07-27 ENCOUNTER — Ambulatory Visit (INDEPENDENT_AMBULATORY_CARE_PROVIDER_SITE_OTHER): Payer: Medicare (Managed Care) | Admitting: Orthopedic Surgery

## 2019-07-27 ENCOUNTER — Encounter: Payer: Self-pay | Admitting: Orthopedic Surgery

## 2019-07-27 ENCOUNTER — Other Ambulatory Visit: Payer: Self-pay

## 2019-07-27 DIAGNOSIS — M545 Low back pain, unspecified: Secondary | ICD-10-CM

## 2019-07-27 DIAGNOSIS — M25561 Pain in right knee: Secondary | ICD-10-CM

## 2019-07-27 DIAGNOSIS — M25551 Pain in right hip: Secondary | ICD-10-CM | POA: Diagnosis not present

## 2019-07-27 DIAGNOSIS — M1711 Unilateral primary osteoarthritis, right knee: Secondary | ICD-10-CM

## 2019-07-27 DIAGNOSIS — G8929 Other chronic pain: Secondary | ICD-10-CM

## 2019-07-27 NOTE — Progress Notes (Signed)
Office Visit Note   Patient: Elaine Lee           Date of Birth: Jun 05, 1945           MRN: [id number] Visit Date: 07/27/2019              Requested by: Jethro Bastos, MD [address] [place],  [place] [address] PCP: Jethro Bastos, MD  Chief Complaint  Patient presents with  . Right Hip - Pain  . Right Knee - Pain      HPI: Patient is a 74 year old woman who presents complaining of venous swelling in both legs with pitting edema and discoloration.  She states she has pain with flexion of the right hip and has difficulty driving her car secondary to the right hip pain.  Patient is on nasal cannula FiO2 uses a rolling walker.  She.  Patient states that she also has right knee pain and chronic lower back pain. Assessment & Plan: Visit Diagnoses:  1. Primary osteoarthritis of right knee   2. Chronic pain of right knee   3. Pain in right hip   4. Chronic midline low back pain without sciatica     Plan:.  Patient is recommended physical therapy and she was given a note for physical therapy for [facility]  Patient was also given a prescription for 2 extra-large the.  Compression stockings.   Follow-Up Instructions: Return if symptoms worsen or fail to improve.   Ortho Exam  Patient is alert, oriented, no adenopathy, well-dressed, normal affect, normal respiratory effort. Examination patient has shortness of breath while seated on nasal cannula FiO2.  She has pulmonary congestion with coughing.  Examination of right hip she has right groin pain reproduced with internal rotation of the right hip of 20 degrees.  She has no pain with external rotation.  Examination of her legs she has a negative straight leg raise no focal motor weakness in either lower extremity she has a calf that measures 48 cm in circumference and she was given a prescription for a 2 extra-large compression stocking.  Examination of the right knee radiographs are reviewed which shows a stable  total knee arthroplasty.  Examination of her lumbar spine shows degenerative disc disease with a spondylolisthesis at L4-5 grade 1.  There is calcification of the aorta which is 2 cm in diameter. She has a negative straight leg raise.  There is pitting edema in both legs with brawny skin color changes over the ankle but no drainage no ulceration.  Imaging: XR Knee 1-2 Views Right  Result Date: 07/27/2019 2 view radiographs of the right knee shows a stable total knee arthroplasty without loosening without complicating features.  No images are attached to the encounter.  Labs: Lab Results  Component Value Date   HGBA1C 6.6 (H) 12/05/2017   HGBA1C 7.1 (H) 06/25/2017   HGBA1C 6.9 (H) 06/13/2014   REPTSTATUS 04/10/2016 FINAL 04/04/2016   CULT  04/04/2016    NO GROWTH 5 DAYS Performed at [facility], [address], [place], [place] [address]    LABORGA PROTEUS MIRABILIS (A) 04/04/2016     Lab Results  Component Value Date   ALBUMIN 3.4 (L) 06/26/2017   ALBUMIN 3.4 (L) 06/25/2017   ALBUMIN 3.7 06/24/2017    Lab Results  Component Value Date   MG 2.2 06/26/2017   MG 2.4 06/25/2017   MG 2.1 04/05/2016   No results found for: VD25OH  No results  found for: PREALBUMIN CBC EXTENDED Latest Ref Rng & Units 11/07/2018 12/08/2017 12/07/2017  WBC 3.4 - 10.8 x10E3/uL 9.2 10.8(H) 13.8(H)  RBC 3.77 - 5.28 x10E6/uL 3.92 3.08(L) 3.03(L)  HGB 11.1 - 15.9 g/dL 17.5 1.0(C) 5.8(N)  HCT 34.0 - 46.6 % 33.7(L) 27.7(L) 27.4(L)  PLT 150 - 450 x10E3/uL 209 211 201  NEUTROABS 1 - 7 x10E3/uL 6.2 - -  LYMPHSABS 0 - 3 x10E3/uL 1.8 - -     There is no height or weight on file to calculate BMI.  Orders:  Orders Placed This Encounter  Procedures  . XR Knee 1-2 Views Right   No orders of the defined types were placed in this encounter.    Procedures: No procedures performed  Clinical Data: No additional findings.  ROS:  All other systems negative, except as noted in the HPI. Review  of Systems  Objective: Vital Signs: There were no vitals taken for this visit.  Specialty Comments:  No specialty comments available.  PMFS History: Patient Active Problem List   Diagnosis Date Noted  . Gastroesophageal reflux disease 01/22/2019  . Severe persistent asthma 11/06/2018  . Allergic rhinitis with a predominantly nonallergic component 11/06/2018  . Cough, persistent 11/06/2018  . Osteoarthritis of right knee 12/05/2017  . Infection due to parainfluenza virus 3 06/27/2017  . Atrial fibrillation with RVR (HCC) 06/24/2017  . UTI (urinary tract infection) 04/05/2016  . Pleuritic chest pain 04/05/2016  . AKI (acute kidney injury) (HCC) 04/05/2016  . Tremor 04/05/2016  . Fall   . Severe persistent asthma with acute exacerbation 10/25/2014  . OSA (obstructive sleep apnea) 10/25/2014  . Morbid obesity (HCC) 10/25/2014  . Acute on chronic respiratory failure with hypoxia (HCC)   . DM2 (diabetes mellitus, type 2) (HCC) 06/14/2014  . CKD (chronic kidney disease), stage III (HCC) 06/14/2014  . Neuropathic pain 06/14/2014  . HTN (hypertension) 06/14/2014  . Type 2 diabetes with stage 3 chronic kidney disease GFR 30-59 (HCC)   . COPD exacerbation (HCC) 06/13/2014  . Acute exacerbation of CHF (congestive heart failure) (HCC) 06/13/2014   Past Medical History:  Diagnosis Date  . A-fib (HCC)   . Asthma   . CHF (congestive heart failure) (HCC)   . Chronic edema   . CKD (chronic kidney disease), stage III   . COPD (chronic obstructive pulmonary disease) (HCC)   . Degenerative lumbar disc   . Depression   . Diabetes mellitus without complication (HCC)   . Diabetic neuropathy (HCC)   . GERD (gastroesophageal reflux disease)   . Gout   . Hearing loss   . Hypercholesteremia   . Hypertension   . Insomnia   . Obstructive sleep apnea on CPAP   . Osteoarthritis    knees and shoulders  . Renal disorder   . Urinary frequency     Family History  Problem Relation Age of Onset   . Allergies Mother   . Rheumatologic disease Mother   . Cancer Mother        leukemia  . Allergies Father   . Asthma Father   . Heart disease Father   . Rheumatologic disease Father   . Cancer Father        leukemia  . Allergies Daughter   . Cancer Sister   . Breast cancer Maternal Aunt   . Breast cancer Paternal Aunt   . Breast cancer Maternal Aunt   . Allergic rhinitis Neg Hx   . Eczema Neg Hx   . Urticaria Neg  Hx     Past Surgical History:  Procedure Laterality Date  . ABDOMINAL HYSTERECTOMY    . KNEE ARTHROPLASTY Right 12/05/2017   Procedure: COMPUTER ASSISTED TOTAL KNEE ARTHROPLASTY;  Surgeon: Samson Frederic, MD;  Location: [facility];  Service: Orthopedics;  Laterality: Right;  . ROTATOR CUFF REPAIR  1980   x3  . TONSILLECTOMY     Social History   Occupational History  . Not on file  Tobacco Use  . Smoking status: Never Smoker  . Smokeless tobacco: Never Used  Vaping Use  . Vaping Use: Never used  Substance and Sexual Activity  . Alcohol use: No    Alcohol/week: 0.0 standard drinks  . Drug use: Never  . Sexual activity: Not on file

## 2019-08-03 ENCOUNTER — Ambulatory Visit
Admission: RE | Admit: 2019-08-03 | Discharge: 2019-08-03 | Disposition: A | Discharge status: Home / Self Care | Payer: No Typology Code available for payment source | Source: Ambulatory Visit | Attending: Nurse Practitioner | Admitting: Nurse Practitioner

## 2019-08-03 DIAGNOSIS — R102 Pelvic and perineal pain: Secondary | ICD-10-CM

## 2019-08-25 ENCOUNTER — Ambulatory Visit (INDEPENDENT_AMBULATORY_CARE_PROVIDER_SITE_OTHER): Payer: Medicare (Managed Care)

## 2019-08-25 ENCOUNTER — Ambulatory Visit (INDEPENDENT_AMBULATORY_CARE_PROVIDER_SITE_OTHER): Payer: Medicare (Managed Care) | Admitting: Physician Assistant

## 2019-08-25 ENCOUNTER — Encounter: Payer: Self-pay | Admitting: Physician Assistant

## 2019-08-25 VITALS — Ht 65.0 in | Wt 282.0 lb

## 2019-08-25 DIAGNOSIS — M25551 Pain in right hip: Secondary | ICD-10-CM

## 2019-08-25 MED ORDER — PREDNISONE 10 MG PO TABS: 10.0000 mg | ORAL_TABLET | Freq: Every day | ORAL | 1 refills | Status: DC

## 2019-08-25 NOTE — Progress Notes (Signed)
Office Visit Note   Patient: Elaine Lee           Date of Birth: Sep 01, 1945           MRN: [id number] Visit Date: 08/25/2019              Requested by: Jethro Bastos, MD [address] [place],  [place] [address] PCP: Jethro Bastos, MD  Chief Complaint  Patient presents with  . Right Hip - Pain      HPI: Is a pleasant 74 year old woman with a history of right hip and back issues. She was last seen by Dr. Lajoyce Corners with similar symptoms 1 month ago. She has had 2 physical therapy sessions as an outpatient. She was also in a nursing facility to receive physical therapy. Unfortunately she did not want to stay there. On 11 August she did sustain a fall onto her right side. She was seen and evaluated in the emergency room where she was told she had no fractures in her hip or knee she continues in significant pain today and has difficulty lifting her leg she has various areas of pain including her groin and her thigh and her knee also gets some shooting pain from her leg to her buttocks  Assessment & Plan: Visit Diagnoses:  1. Pain in right hip     Plan: Talking with her family it is an exacerbation of what she had going on. I will try her on 10 mg of prednisone daily with breakfast for the next week. She will be followed up at that time for reevaluation. She is hesitant to begin any more physical therapy she also has a history of arthritis in her spine and certainly if her symptoms do not get better we could consider an MRI. If her painful  symptoms escalated she should go to the ER  Follow-Up Instructions: No follow-ups on file.   Ortho Exam  Patient is alert, oriented, no adenopathy, well-dressed, normal affect, normal respiratory effort. Compartments are soft she is able to dorsiflex and plantarflex her ankle without any difficulty. She is hesitant to lift her leg because she says it recreates the pain. She has limited internal and external rotation of her hip distal CMS is  intact  Imaging: No results found. No images are attached to the encounter.  Labs: Lab Results  Component Value Date   HGBA1C 6.6 (H) 12/05/2017   HGBA1C 7.1 (H) 06/25/2017   HGBA1C 6.9 (H) 06/13/2014   REPTSTATUS 04/10/2016 FINAL 04/04/2016   CULT  04/04/2016    NO GROWTH 5 DAYS Performed at [facility], [address] [place], [place] [address]    LABORGA PROTEUS MIRABILIS (A) 04/04/2016     Lab Results  Component Value Date   ALBUMIN 3.4 (L) 06/26/2017   ALBUMIN 3.4 (L) 06/25/2017   ALBUMIN 3.7 06/24/2017    Lab Results  Component Value Date   MG 2.2 06/26/2017   MG 2.4 06/25/2017   MG 2.1 04/05/2016   No results found for: VD25OH  No results found for: PREALBUMIN CBC EXTENDED Latest Ref Rng & Units 11/07/2018 12/08/2017 12/07/2017  WBC 3.4 - 10.8 x10E3/uL 9.2 10.8(H) 13.8(H)  RBC 3.77 - 5.28 x10E6/uL 3.92 3.08(L) 3.03(L)  HGB 11.1 - 15.9 g/dL 15.1 7.6(H) 6.0(V)  HCT 34.0 - 46.6 % 33.7(L) 27.7(L) 27.4(L)  PLT 150 - 450 x10E3/uL 209 211 201  NEUTROABS 1 - 7 x10E3/uL 6.2 - -  LYMPHSABS 0 - 3 x10E3/uL 1.8 - -  Body mass index is 46.93 kg/m.  Orders:  Orders Placed This Encounter  Procedures  . XR HIP UNILAT W OR W/O PELVIS 2-3 VIEWS RIGHT   Meds ordered this encounter  Medications  . predniSONE (DELTASONE) 10 MG tablet    Sig: Take 1 tablet (10 mg total) by mouth daily with breakfast.    Dispense:  30 tablet    Refill:  1     Procedures: No procedures performed  Clinical Data: No additional findings.  ROS:  All other systems negative, except as noted in the HPI. Review of Systems  Objective: Vital Signs: Ht 5\' 5"  (1.651 m)   Wt 282 lb (127.9 kg)   BMI 46.93 kg/m   Specialty Comments:  No specialty comments available.  PMFS History: Patient Active Problem List   Diagnosis Date Noted  . Gastroesophageal reflux disease 01/22/2019  . Severe persistent asthma 11/06/2018  . Allergic rhinitis with a predominantly nonallergic  component 11/06/2018  . Cough, persistent 11/06/2018  . Osteoarthritis of right knee 12/05/2017  . Infection due to parainfluenza virus 3 06/27/2017  . Atrial fibrillation with RVR (HCC) 06/24/2017  . UTI (urinary tract infection) 04/05/2016  . Pleuritic chest pain 04/05/2016  . AKI (acute kidney injury) (HCC) 04/05/2016  . Tremor 04/05/2016  . Fall   . Severe persistent asthma with acute exacerbation 10/25/2014  . OSA (obstructive sleep apnea) 10/25/2014  . Morbid obesity (HCC) 10/25/2014  . Acute on chronic respiratory failure with hypoxia (HCC)   . DM2 (diabetes mellitus, type 2) (HCC) 06/14/2014  . CKD (chronic kidney disease), stage III (HCC) 06/14/2014  . Neuropathic pain 06/14/2014  . HTN (hypertension) 06/14/2014  . Type 2 diabetes with stage 3 chronic kidney disease GFR 30-59 (HCC)   . COPD exacerbation (HCC) 06/13/2014  . Acute exacerbation of CHF (congestive heart failure) (HCC) 06/13/2014   Past Medical History:  Diagnosis Date  . A-fib (HCC)   . Asthma   . CHF (congestive heart failure) (HCC)   . Chronic edema   . CKD (chronic kidney disease), stage III   . COPD (chronic obstructive pulmonary disease) (HCC)   . Degenerative lumbar disc   . Depression   . Diabetes mellitus without complication (HCC)   . Diabetic neuropathy (HCC)   . GERD (gastroesophageal reflux disease)   . Gout   . Hearing loss   . Hypercholesteremia   . Hypertension   . Insomnia   . Obstructive sleep apnea on CPAP   . Osteoarthritis    knees and shoulders  . Renal disorder   . Urinary frequency     Family History  Problem Relation Age of Onset  . Allergies Mother   . Rheumatologic disease Mother   . Cancer Mother        leukemia  . Allergies Father   . Asthma Father   . Heart disease Father   . Rheumatologic disease Father   . Cancer Father        leukemia  . Allergies Daughter   . Cancer Sister   . Breast cancer Maternal Aunt   . Breast cancer Paternal Aunt   . Breast  cancer Maternal Aunt   . Allergic rhinitis Neg Hx   . Eczema Neg Hx   . Urticaria Neg Hx     Past Surgical History:  Procedure Laterality Date  . ABDOMINAL HYSTERECTOMY    . KNEE ARTHROPLASTY Right 12/05/2017   Procedure: COMPUTER ASSISTED TOTAL KNEE ARTHROPLASTY;  Surgeon: 14/05/2017, MD;  Location:  [facility];  Service: Orthopedics;  Laterality: Right;  . ROTATOR CUFF REPAIR  1980   x3  . TONSILLECTOMY     Social History   Occupational History  . Not on file  Tobacco Use  . Smoking status: Never Smoker  . Smokeless tobacco: Never Used  Vaping Use  . Vaping Use: Never used  Substance and Sexual Activity  . Alcohol use: No    Alcohol/week: 0.0 standard drinks  . Drug use: Never  . Sexual activity: Not on file

## 2019-09-09 ENCOUNTER — Encounter: Payer: Self-pay | Admitting: Critical Care Medicine

## 2019-09-09 ENCOUNTER — Ambulatory Visit: Payer: Medicare (Managed Care) | Admitting: Critical Care Medicine

## 2019-09-09 ENCOUNTER — Other Ambulatory Visit: Payer: Self-pay

## 2019-09-09 ENCOUNTER — Ambulatory Visit (INDEPENDENT_AMBULATORY_CARE_PROVIDER_SITE_OTHER): Payer: Medicare (Managed Care) | Admitting: Critical Care Medicine

## 2019-09-09 VITALS — BP 124/60 | HR 68 | Temp 97.4°F | Ht 65.0 in | Wt 277.0 lb

## 2019-09-09 DIAGNOSIS — J4550 Severe persistent asthma, uncomplicated: Secondary | ICD-10-CM

## 2019-09-09 DIAGNOSIS — J309 Allergic rhinitis, unspecified: Secondary | ICD-10-CM | POA: Diagnosis not present

## 2019-09-09 NOTE — Progress Notes (Signed)
Synopsis: Referred in September 2016 for asthma, OHS, OSA by Jethro Bastos, MD.  Previously patient of Dr. Craige Cotta.  Subjective:   PATIENT ID: Elaine Lee GENDER: female DOB: 07/08/1945, MRN: [id number]  Chief Complaint  Patient presents with   Follow-up    Patient states she has had to increase her breathing treatments lately. Has some wheezing. Finished prednisone for leg recently    [name] is a 74 y/o woman with a history of severe persistent allergic asthma on Biologics who presents for follow-up.  She has her appointment with Dr. Nunzio Cobbs tomorrow for her next dose of Fasenra.  She has not required prednisone for her breathing since her last visit.  She was treated with antibiotics for an acute exacerbation with improvement in her symptoms.  She has been using Pulmicort nebs 2-3 times per day in addition to high-dose Advair due to seasonal worsening in her symptoms.  She continues on Spiriva daily, Claritin daily, montelukast daily, azelastine daily.  She has not been using her albuterol as frequently.  Her symptoms are overall stable.  She had a flareup of bursitis in her hip, which has improved with prednisone and physical therapy.     OV 06/09/19: [name] is a 74 y/o woman with a history of severe persistent allergic asthma who presents for follow up. She remains on advair bid, budesonide nebs BID, singulair, claritin, azelastine nasal spray. Dr. Nunzio Cobbs (5/20 note reviewed) recently added ipratropium nasal spray for ongoing productive cough. Last fasenra injection in late May; now receiving every other month at Dr. Sheran Fava office. She continues to follow up with her PCP at [facility]. She received another dose of prednisone in May and has had recurrent symptoms of wheezing and cough again since being off prednisone. She seldom uses her albuterol.  She is planning on moving out of her home and into a senior living community. She is hopeful that this will  improve her allergies. She overall feels that her asthma is better because we have been able to avoid hospitalizations.   OV 04/08/19: [name] is a 74 y/o woman with severe persistent asthma who presents for follow up. Since her last visit she completed her course of Augmentin and has been on prednisone 10 mg daily.  Her symptoms are significantly improved.  She is no longer wheezing, sputum production has resolved since completing Augmentin.  Much less coughing.  She would like to be able to stop prednisone due to weight gain.  She had her second Fasenra injection last week.  Her next Harrington Challenger injection is in June.  She is continued on her Advair and Spiriva inhalers.  She continues to use azelastine nasal spray.  She remains on Singulair and Claritin daily.  She feels that she is doing pretty well for this time of year did not be admitted to the hospital.   OV 03/19/19: [name] is a 74 year old woman with a history of severe persistent allergic asthma who presents for follow-up.  She was started on prednisone 10 mg daily due to persistent uncontrolled symptoms last week.  Her symptoms are stable or mildly improved.  She feels like catching up and started on prednisone they would have continued worsening as they have in the past.  Her next Fasenra injection is later this month.  She remains on Spiriva, high-dose Advair plus budesonide nebs, montelukast, Claritin, and azelastine nasal spray.  Not been using Flonase.  Her heartburn is improved.   OV 03/10/19: [name]  is a 74 y/o woman with a history of severe persistent asthma.  She has required hospitalizations 3 times in the last several months, most recently overnight on February 15, 2019.  She was discharged on prednisone.  She followed up with her allergist, Dr. Katina Dung, and her initiation of Harrington Challenger was delayed due to asthma exacerbation.  At that time she was started on prednisone and inhaled budesonide in addition to  high-dose Advair and Spiriva.  She continues to take montelukast daily, Flonase twice daily, Mucinex twice daily, and Claritin once daily.  She has not been using her flutter valve.  She is using albuterol nebs 4 times daily, but no longer using saline sinus rinses.  She last finished prednisone on 03/06/2019, with no change in symptoms since then.  She continues to have frequent coughing with occasional yellow sputum production.  She is constantly short of breath, worse with exertion.  She has frequent wheezing.  She has walked with her pulse oximeter at home, dropped as low as 86%, but quickly recovers into the 90s.  She was able to receive her first dose of Fasenra on 03/02/2019 without problems.  Her allergist has escalated her GERD regimen to include both a PPI and famotidine.     OV12/01/2018: [name] is a 74 year old woman with a history of allergies, asthma and OSA on CPAP.  She formally had OHS requiring home oxygen, but has not needed for the past 2 years after losing weight.  Her asthma has been worse recently.  She has been hospitalized 2 times in the past few months at [facility].  She has been following up with her allergist Dr. Nunzio Cobbs, who is planning on starting her on an anti-IL-5 biologic in the future.  He added Spiriva to her regimen at her last visit, which she thinks is helping.  She remains on Advair 250-50, montelukast, Claritin, and Flonase nasal spray.  She has been using her nebulizer less in the last few weeks since recovering from her exacerbation-twice per day in the morning and before bed.  Due to nocturnal symptoms she has not been using her CPAP at night.  Her symptoms are exacerbated by strong smells, and when her allergies are worse, commonly this time of year.  Wheezing is her main complaint.  Her nonproductive cough has caused her to have rib pain, but overall her cough is improving since hospitalization.  She has 1 pet dog, but denies a history of allergies to dogs.   She is concerned that she has mold in a closet from a previous leak in her roof that she has not been able to have fixed yet.  Breathing is a contributor to her activity limitation, but she also attributes hip and back pain.   She has a history of chronic GERD that is uncontrolled.  She is taking Pepcid twice daily and has never been on a PPI.  She has frequent belching and throat clearing.  She avoids spicy foods, eating mostly bland foods, which helps.  She does not drink caffeine, but frequently drinks carbonated beverages and eats chocolate.  She has struggled more with her depression this year.  [facility], but has been unable to go since it has been closed due to COVID-19.  Her husband passed away suddenly in the spring.  She now lives with her son, but has stress due to his history of drug use.  While she was recently hospitalized she  had a CT scan which demonstrated a pleural effusion.     Past Medical History:  Diagnosis Date   A-fib (HCC)    Asthma    CHF (congestive heart failure) (HCC)    Chronic edema    CKD (chronic kidney disease), stage III    COPD (chronic obstructive pulmonary disease) (HCC)    Degenerative lumbar disc    Depression    Diabetes mellitus without complication (HCC)    Diabetic neuropathy (HCC)    GERD (gastroesophageal reflux disease)    Gout    Hearing loss    Hypercholesteremia    Hypertension    Insomnia    Obstructive sleep apnea on CPAP    Osteoarthritis    knees and shoulders   Renal disorder    Urinary frequency      Family History  Problem Relation Age of Onset   Allergies Mother    Rheumatologic disease Mother    Cancer Mother        leukemia   Allergies Father    Asthma Father    Heart disease Father    Rheumatologic disease Father    Cancer Father        leukemia   Allergies Daughter    Cancer Sister    Breast cancer Maternal Aunt    Breast  cancer Paternal Aunt    Breast cancer Maternal Aunt    Allergic rhinitis Neg Hx    Eczema Neg Hx    Urticaria Neg Hx      Past Surgical History:  Procedure Laterality Date   ABDOMINAL HYSTERECTOMY     KNEE ARTHROPLASTY Right 12/05/2017   Procedure: COMPUTER ASSISTED TOTAL KNEE ARTHROPLASTY;  Surgeon: Samson Frederic, MD;  Location: [facility];  Service: Orthopedics;  Laterality: Right;   ROTATOR CUFF REPAIR  1980   x3   TONSILLECTOMY      Social History   Socioeconomic History   Marital status: Married    Spouse name: Not on file   Number of children: Not on file   Years of education: Not on file   Highest education level: Not on file  Occupational History   Not on file  Tobacco Use   Smoking status: Never Smoker   Smokeless tobacco: Never Used  Vaping Use   Vaping Use: Never used  Substance and Sexual Activity   Alcohol use: No    Alcohol/week: 0.0 standard drinks   Drug use: Never   Sexual activity: Not on file  Other Topics Concern   Not on file  Social History Narrative   Not on file   Social Determinants of Health   Financial Resource Strain:    Difficulty of Paying Living Expenses: Not on file  Food Insecurity:    Worried About Running Out of Food in the Last Year: Not on file   Ran Out of Food in the Last Year: Not on file  Transportation Needs:    Lack of Transportation (Medical): Not on file   Lack of Transportation (Non-Medical): Not on file  Physical Activity:    Days of Exercise per Week: Not on file   Minutes of Exercise per Session: Not on file  Stress:    Feeling of Stress : Not on file  Social Connections:    Frequency of Communication with Friends and Family: Not on file   Frequency of Social Gatherings with Friends and Family: Not on file   Attends Religious Services: Not on file   Active Member of Clubs or  Organizations: Not on file   Attends Banker Meetings: Not on file   Marital Status: Not  on file  Intimate Partner Violence:    Fear of Current or Ex-Partner: Not on file   Emotionally Abused: Not on file   Physically Abused: Not on file   Sexually Abused: Not on file     Allergies  Allergen Reactions   Other Shortness Of Breath and Other (See Comments)    ROSEMARY AND HEAVY SCENTED PERFUMES, DETERGENTS, ETC   Tape Itching, Rash and Other (See Comments)    Burns and blisters USE PAPER TAPE   Trazodone And Nefazodone Shortness Of Breath and Swelling   Celebrex [Celecoxib] Other (See Comments)    HARD TIME HEARING, LIKE BEING UNDERWATER   Latex Hives, Itching and Rash   Mysoline [Primidone] Other (See Comments)    unknown   Niacin And Related Hives and Swelling   Sulfa Antibiotics Other (See Comments)    TONGUE BLISTERS     Aspirin Other (See Comments)    GI PROBLEMS AND PAIN     Immunization History  Administered Date(s) Administered   Fluad Quad(high Dose 65+) 04/02/2018, 12/02/2018   Influenza,inj,Quad PF,6+ Mos 10/04/2014   Moderna SARS-COVID-2 Vaccination 03/31/2019, 04/29/2019    Outpatient Medications Prior to Visit  Medication Sig Dispense Refill   acetaminophen (TYLENOL) 500 MG tablet Take 500 mg by mouth every 6 (six) hours as needed.     albuterol (ACCUNEB) 0.63 MG/3ML nebulizer solution Take 1 ampule by nebulization 3 (three) times daily as needed for wheezing or shortness of breath.     albuterol (VENTOLIN HFA) 108 (90 Base) MCG/ACT inhaler Inhale 1-2 puffs into the lungs every 6 (six) hours as needed for wheezing or shortness of breath.     atorvastatin (LIPITOR) 10 MG tablet Take 10 mg by mouth at bedtime.      azelastine (ASTELIN) 0.1 % nasal spray Place 2 sprays into both nostrils 2 (two) times daily. Use in each nostril as directed 30 mL 12   budesonide (PULMICORT) 0.5 MG/2ML nebulizer solution Take 2 mLs (0.5 mg total) by nebulization in the morning and at bedtime. 120 mL 5   Cholecalciferol (VITAMIN D3) 125 MCG (5000 UT)  CAPS Take 5,000 Units by mouth daily.     citalopram (CELEXA) 20 MG tablet Take 20 mg by mouth daily.     diltiazem (CARDIZEM CD) 240 MG 24 hr capsule Take 300 mg by mouth daily. For blood pressure     famotidine (PEPCID) 20 MG tablet Take 20 mg by mouth 2 (two) times daily.     Fluticasone-Salmeterol (ADVAIR) 250-50 MCG/DOSE AEPB Inhale 1 puff into the lungs 2 (two) times daily.     furosemide (LASIX) 40 MG tablet Take 40 mg by mouth daily.      gabapentin (NEURONTIN) 600 MG tablet Take 1 tablet (600 mg total) by mouth 2 (two) times daily. (Patient taking differently: Take 600-1,200 mg by mouth See admin instructions. Take 600 mg by mouth in the morning and take 1200 mg by mouth at bedtime) 60 tablet 2   guaiFENesin (MUCINEX) 600 MG 12 hr tablet Take 600 mg by mouth 2 (two) times daily.     liraglutide (VICTOZA) 18 MG/3ML SOPN Inject 1.8 mg into the skin daily. For blood sugar control     loratadine (CLARITIN) 10 MG tablet Take 10 mg by mouth every morning.     losartan (COZAAR) 100 MG tablet Take 100 mg by mouth daily.  melatonin 5 MG TABS Take 5 mg by mouth at bedtime.     Menthol, Topical Analgesic, (BIOFREEZE) 4 % GEL Apply topically. Apply topically as needed to painful joints     mirabegron ER (MYRBETRIQ) 25 MG TB24 tablet Take 50 mg by mouth daily. For bladder control     montelukast (SINGULAIR) 10 MG tablet Take 1 tablet (10 mg total) by mouth at bedtime. 30 tablet 5   Mouthwashes (BIOTENE DRY MOUTH GENTLE) LIQD Use as directed in the mouth or throat. 1 cap full Q2 hr PRN for dry mouth     nystatin (NYSTATIN) powder Apply 1 g topically 4 (four) times daily as needed (for skin folds).      pantoprazole (PROTONIX) 40 MG tablet Take 1 tablet (40 mg total) by mouth daily. 30 tablet 11   polyethylene glycol (MIRALAX / GLYCOLAX) packet Take 17 g by mouth daily. (Patient taking differently: Take 17 g by mouth daily as needed for moderate constipation. ) 14 each 0    predniSONE (DELTASONE) 10 MG tablet Take 1 tablet (10 mg total) by mouth daily with breakfast. 30 tablet 1   rivaroxaban (XARELTO) 20 MG TABS tablet Take 20 mg by mouth daily with supper.     rOPINIRole (REQUIP) 0.5 MG tablet Take 1 mg by mouth at bedtime.     tetrahydrozoline (VISINE) 0.05 % ophthalmic solution Place 1 drop into both eyes 4 (four) times daily as needed (for eye irritation).      Tiotropium Bromide Monohydrate (SPIRIVA RESPIMAT) 1.25 MCG/ACT AERS Inhale 2 puffs into the lungs daily.     traMADol (ULTRAM) 50 MG tablet Take 50 mg by mouth every 6 (six) hours as needed.     Facility-Administered Medications Prior to Visit  Medication Dose Route Frequency Provider Last Rate Last Admin   Benralizumab SOSY 30 mg  30 mg Subcutaneous Q14 Days Bobbitt, Heywood Iles, MD   30 mg at 07/16/19 1147    Review of Systems  Constitutional: Negative for chills, fever and weight loss.  HENT: Positive for congestion.        Postnasal drip  Eyes: Negative.   Respiratory: Positive for cough, shortness of breath and wheezing. Negative for sputum production.   Cardiovascular: Negative for chest pain and leg swelling.  Gastrointestinal: Positive for heartburn. Negative for abdominal pain, diarrhea, nausea and vomiting.  Genitourinary: Negative.   Musculoskeletal: Negative.   Skin: Negative for rash.  Neurological: Negative for weakness and headaches.  Endo/Heme/Allergies: Positive for environmental allergies.  Psychiatric/Behavioral: Positive for depression. The patient is nervous/anxious.      Objective:   Vitals:   09/09/19 1223  BP: 124/60  Pulse: 68  Temp: (!) 97.4 F (36.3 C)  TempSrc: Temporal  SpO2: 94%  Weight: 277 lb (125.6 kg)  Height: 5\' 5"  (1.651 m)   94% on   RA BMI Readings from Last 3 Encounters:  09/09/19 46.10 kg/m  08/25/19 46.93 kg/m  06/09/19 46.93 kg/m   Wt Readings from Last 3 Encounters:  09/09/19 277 lb (125.6 kg)  08/25/19 282 lb (127.9 kg)    06/09/19 282 lb (127.9 kg)    Physical Exam Vitals reviewed.  HENT:     Head: Normocephalic and atraumatic.  Eyes:     General: No scleral icterus. Cardiovascular:     Rate and Rhythm: Normal rate and regular rhythm.     Heart sounds: No murmur heard.   Pulmonary:     Comments: Occasional coughing, no wheezing.  Breathing comfortably on  room air. Abdominal:     Palpations: Abdomen is soft.     Tenderness: There is no abdominal tenderness.  Musculoskeletal:        General: No swelling or deformity.     Cervical back: Neck supple.     Comments: Moderate LE edema, at baseline.  Lymphadenopathy:     Cervical: No cervical adenopathy.  Skin:    General: Skin is warm and dry.     Findings: No rash.  Neurological:     General: No focal deficit present.     Mental Status: She is alert.     Coordination: Coordination normal.  Psychiatric:        Mood and Affect: Mood normal.        Behavior: Behavior normal.      CBC    Component Value Date/Time   WBC 9.2 11/07/2018 1655   WBC 10.8 (H) 12/08/2017 0439   RBC 3.92 11/07/2018 1655   RBC 3.08 (L) 12/08/2017 0439   HGB 11.2 11/07/2018 1655   HCT 33.7 (L) 11/07/2018 1655   PLT 209 11/07/2018 1655   MCV 86 11/07/2018 1655   MCH 28.6 11/07/2018 1655   MCH 28.2 12/08/2017 0439   MCHC 33.2 11/07/2018 1655   MCHC 31.4 12/08/2017 0439   RDW 13.1 11/07/2018 1655   LYMPHSABS 1.8 11/07/2018 1655   MONOABS 0.4 06/26/2017 0614   EOSABS 0.5 (H) 11/07/2018 1655   BASOSABS 0.0 11/07/2018 1655    CHEMISTRY No results for input(s): NA, K, CL, CO2, GLUCOSE, BUN, CREATININE, CALCIUM, MG, PHOS in the last 168 hours. CrCl cannot be calculated (Patient's most recent lab result is older than the maximum 21 days allowed.).  Alpha-1 antitrypsin phenotype MM, 146 Alpha gal panel negative IgE 247 Allergy panel negative (per Dr. Sheran Fava notes, skin testing was positive for major mold mix #4)  Chest Imaging- films reviewed: CXR, 2 view  11/22/2017-prominent hila bilaterally, airway thickening.  Loss of AP window on the left.  CT chest 02/20/2019- upper lobe predominant air trapping, RML nodule suspicious for mucus plugging.   Pulmonary Functions Testing Results: No flowsheet data found.  11/06/2018 office spirometry: FVC 1.64 (56%)--> 1.48 (51%) , -10% FEV1 1.2 (55%) --> 1.04 (47%), -13% Ratio 0.73--> 0.70 Expiratory loop consistent with obstruction   Echocardiogram 04/06/2016: LVEF 60 to 65%, indeterminate diastolic function.  Mildly dilated LA, normal RV, mildly dilated RA.  Enlarged IVC with reduced changes with respiration.  Mild TR, otherwise normal valves.      Assessment & Plan:     ICD-10-CM   1. Allergic rhinitis, unspecified seasonality, unspecified trigger  J30.9   2. Severe persistent asthma without complication  J45.50     Severe persistent allergic asthma- very stable interval, but her symptoms remain challenging to control. Elevated IgE & eosinophils. -Continue high-dose Advair and budesonide nebs BID-okay to escalate to 3 times daily when her symptoms are worse given her history of hospitalizations for severe exacerbations. -Continue allergy avoidance measures. -Continue Spiriva daily. -Continue Singulair and Claritin daily. -Continue azelastine and ipratropium nasal sprays-recommend using twice daily. -Continue Fasenra injections- Appreciate Dr. Sheran Fava assistance. -Continue aggressive GERD management *Prescriptions always sent to to [phone], [facility]  Allergic rhinosinusitis -Continue Claritin, Singulair, nasal sprays   RTC in 3 months with Dr. Judeth Horn.    Current Outpatient Medications:    acetaminophen (TYLENOL) 500 MG tablet, Take 500 mg by mouth every 6 (six) hours as needed., Disp: , Rfl:    albuterol (ACCUNEB) 0.63  MG/3ML nebulizer solution, Take 1 ampule by nebulization 3 (three) times daily as needed for wheezing or shortness of breath., Disp: , Rfl:     albuterol (VENTOLIN HFA) 108 (90 Base) MCG/ACT inhaler, Inhale 1-2 puffs into the lungs every 6 (six) hours as needed for wheezing or shortness of breath., Disp: , Rfl:    atorvastatin (LIPITOR) 10 MG tablet, Take 10 mg by mouth at bedtime. , Disp: , Rfl:    azelastine (ASTELIN) 0.1 % nasal spray, Place 2 sprays into both nostrils 2 (two) times daily. Use in each nostril as directed, Disp: 30 mL, Rfl: 12   budesonide (PULMICORT) 0.5 MG/2ML nebulizer solution, Take 2 mLs (0.5 mg total) by nebulization in the morning and at bedtime., Disp: 120 mL, Rfl: 5   Cholecalciferol (VITAMIN D3) 125 MCG (5000 UT) CAPS, Take 5,000 Units by mouth daily., Disp: , Rfl:    citalopram (CELEXA) 20 MG tablet, Take 20 mg by mouth daily., Disp: , Rfl:    diltiazem (CARDIZEM CD) 240 MG 24 hr capsule, Take 300 mg by mouth daily. For blood pressure, Disp: , Rfl:    famotidine (PEPCID) 20 MG tablet, Take 20 mg by mouth 2 (two) times daily., Disp: , Rfl:    Fluticasone-Salmeterol (ADVAIR) 250-50 MCG/DOSE AEPB, Inhale 1 puff into the lungs 2 (two) times daily., Disp: , Rfl:    furosemide (LASIX) 40 MG tablet, Take 40 mg by mouth daily. , Disp: , Rfl:    gabapentin (NEURONTIN) 600 MG tablet, Take 1 tablet (600 mg total) by mouth 2 (two) times daily. (Patient taking differently: Take 600-1,200 mg by mouth See admin instructions. Take 600 mg by mouth in the morning and take 1200 mg by mouth at bedtime), Disp: 60 tablet, Rfl: 2   guaiFENesin (MUCINEX) 600 MG 12 hr tablet, Take 600 mg by mouth 2 (two) times daily., Disp: , Rfl:    liraglutide (VICTOZA) 18 MG/3ML SOPN, Inject 1.8 mg into the skin daily. For blood sugar control, Disp: , Rfl:    loratadine (CLARITIN) 10 MG tablet, Take 10 mg by mouth every morning., Disp: , Rfl:    losartan (COZAAR) 100 MG tablet, Take 100 mg by mouth daily., Disp: , Rfl:    melatonin 5 MG TABS, Take 5 mg by mouth at bedtime., Disp: , Rfl:    Menthol, Topical Analgesic, (BIOFREEZE) 4 %  GEL, Apply topically. Apply topically as needed to painful joints, Disp: , Rfl:    mirabegron ER (MYRBETRIQ) 25 MG TB24 tablet, Take 50 mg by mouth daily. For bladder control, Disp: , Rfl:    montelukast (SINGULAIR) 10 MG tablet, Take 1 tablet (10 mg total) by mouth at bedtime., Disp: 30 tablet, Rfl: 5   Mouthwashes (BIOTENE DRY MOUTH GENTLE) LIQD, Use as directed in the mouth or throat. 1 cap full Q2 hr PRN for dry mouth, Disp: , Rfl:    nystatin (NYSTATIN) powder, Apply 1 g topically 4 (four) times daily as needed (for skin folds). , Disp: , Rfl:    pantoprazole (PROTONIX) 40 MG tablet, Take 1 tablet (40 mg total) by mouth daily., Disp: 30 tablet, Rfl: 11   polyethylene glycol (MIRALAX / GLYCOLAX) packet, Take 17 g by mouth daily. (Patient taking differently: Take 17 g by mouth daily as needed for moderate constipation. ), Disp: 14 each, Rfl: 0   predniSONE (DELTASONE) 10 MG tablet, Take 1 tablet (10 mg total) by mouth daily with breakfast., Disp: 30 tablet, Rfl: 1   rivaroxaban (XARELTO) 20  MG TABS tablet, Take 20 mg by mouth daily with supper., Disp: , Rfl:    rOPINIRole (REQUIP) 0.5 MG tablet, Take 1 mg by mouth at bedtime., Disp: , Rfl:    tetrahydrozoline (VISINE) 0.05 % ophthalmic solution, Place 1 drop into both eyes 4 (four) times daily as needed (for eye irritation). , Disp: , Rfl:    Tiotropium Bromide Monohydrate (SPIRIVA RESPIMAT) 1.25 MCG/ACT AERS, Inhale 2 puffs into the lungs daily., Disp: , Rfl:    traMADol (ULTRAM) 50 MG tablet, Take 50 mg by mouth every 6 (six) hours as needed., Disp: , Rfl:   Current Facility-Administered Medications:    Benralizumab SOSY 30 mg, 30 mg, Subcutaneous, Q14 Days, Bobbitt, Heywood Iles, MD, 30 mg at 07/16/19 1147   Steffanie Dunn, DO [facility] 09/09/2019 12:37 PM

## 2019-09-09 NOTE — Patient Instructions (Addendum)
Thank you for visiting Dr. Chestine Spore at [facility]. We recommend the following:  Keep all of your inhalers and nebulizers the same. Keep taking your allergy medications.  Keep follow up with Dr. Nunzio Cobbs. Please get your seasonal Flu shot when available.    Return in about 3 months (around 12/09/2019). with Dr. Judeth Horn.    Please do your part to reduce the spread of COVID-19.

## 2019-09-10 ENCOUNTER — Ambulatory Visit (INDEPENDENT_AMBULATORY_CARE_PROVIDER_SITE_OTHER): Payer: Medicare (Managed Care)

## 2019-09-10 DIAGNOSIS — J4550 Severe persistent asthma, uncomplicated: Secondary | ICD-10-CM

## 2019-09-11 ENCOUNTER — Ambulatory Visit: Payer: Medicare (Managed Care) | Admitting: Physician Assistant

## 2019-09-23 ENCOUNTER — Other Ambulatory Visit: Payer: Self-pay | Admitting: Family Medicine

## 2019-09-23 DIAGNOSIS — Z1231 Encounter for screening mammogram for malignant neoplasm of breast: Secondary | ICD-10-CM

## 2019-10-09 ENCOUNTER — Ambulatory Visit
Admission: RE | Admit: 2019-10-09 | Discharge: 2019-10-09 | Disposition: A | Discharge status: Home / Self Care | Payer: Medicare (Managed Care) | Source: Ambulatory Visit | Attending: Family Medicine | Admitting: Family Medicine

## 2019-10-09 ENCOUNTER — Other Ambulatory Visit: Payer: Self-pay

## 2019-10-09 DIAGNOSIS — Z1231 Encounter for screening mammogram for malignant neoplasm of breast: Secondary | ICD-10-CM

## 2019-11-05 ENCOUNTER — Ambulatory Visit (INDEPENDENT_AMBULATORY_CARE_PROVIDER_SITE_OTHER): Payer: Medicare (Managed Care)

## 2019-11-05 ENCOUNTER — Other Ambulatory Visit: Payer: Self-pay

## 2019-11-05 DIAGNOSIS — J4550 Severe persistent asthma, uncomplicated: Secondary | ICD-10-CM | POA: Diagnosis not present

## 2019-12-21 ENCOUNTER — Ambulatory Visit: Payer: Medicare (Managed Care) | Admitting: Pulmonary Disease

## 2019-12-31 ENCOUNTER — Ambulatory Visit (INDEPENDENT_AMBULATORY_CARE_PROVIDER_SITE_OTHER): Payer: Medicare (Managed Care)

## 2019-12-31 DIAGNOSIS — J4550 Severe persistent asthma, uncomplicated: Secondary | ICD-10-CM

## 2020-02-25 ENCOUNTER — Ambulatory Visit: Payer: Medicare (Managed Care)

## 2020-03-03 ENCOUNTER — Ambulatory Visit: Payer: Medicare Other | Admitting: Allergy & Immunology

## 2020-03-07 ENCOUNTER — Ambulatory Visit (INDEPENDENT_AMBULATORY_CARE_PROVIDER_SITE_OTHER): Payer: Medicare Other | Admitting: Allergy & Immunology

## 2020-03-07 ENCOUNTER — Telehealth: Payer: Self-pay | Admitting: *Deleted

## 2020-03-07 ENCOUNTER — Encounter: Payer: Self-pay | Admitting: Allergy & Immunology

## 2020-03-07 ENCOUNTER — Other Ambulatory Visit: Payer: Self-pay

## 2020-03-07 VITALS — BP 112/68 | HR 84 | Temp 98.2°F | Resp 24

## 2020-03-07 DIAGNOSIS — J4551 Severe persistent asthma with (acute) exacerbation: Secondary | ICD-10-CM | POA: Diagnosis not present

## 2020-03-07 DIAGNOSIS — K219 Gastro-esophageal reflux disease without esophagitis: Secondary | ICD-10-CM

## 2020-03-07 DIAGNOSIS — J310 Chronic rhinitis: Secondary | ICD-10-CM

## 2020-03-07 NOTE — Patient Instructions (Addendum)
1. Severe persistent asthma without complication - Lung testing looked terrible today. - I think this is related to your not having your Harrington Challenger recently. - We will get that restarted as soon as we can. - Start the prednisone pack provided today. - Daily controller medication(s): Fasenra every 8 weeks and Advair 250/46mcg one puff once daily - Prior to physical activity: albuterol 2 puffs 10-15 minutes before physical activity. - Rescue medications: albuterol 4 puffs every 4-6 hours as needed - Asthma control goals:  * Full participation in all desired activities (may need albuterol before activity) * Albuterol use two time or less a week on average (not counting use with activity) * Cough interfering with sleep two time or less a month * Oral steroids no more than once a year * No hospitalizations  2. Gastroesophageal reflux disease - Continue with Protonix 40mg  once daily. - Continue with Pepcid 40mg  twice daily.  3. Mixed rhinitis - Continue with the Atrovent one spray per nostril twice daily as needed for runny nose. - Continue with Astelin one spray per nostril twice daily as needed.   4. Return in about 6 months (around 09/07/2020).    Please inform of any Emergency Department visits, hospitalizations, or changes in symptoms. Call 11/07/2020 before going to the ED for breathing or allergy symptoms since we might be able to fit you in for a sick visit. Feel free to contact us anytime with any questions, problems, or concerns.  It was a pleasure to meet you today!  Websites that have reliable patient information: 1. American Academy of Asthma, Allergy, and Immunology: www.aaaai.org 2. Food Allergy Research and Education (FARE): foodallergy.org 3. Mothers of Asthmatics: http://www.asthmacommunitynetwork.org 4. American College of Allergy, Asthma, and Immunology: www.acaai.org   COVID-19 Vaccine Information can be found at:  [place] For questions related to vaccine distribution or appointments, please email [email or web address] or call [phone].   We realize that you might be concerned about having an allergic reaction to the COVID19 vaccines. To help with that concern, WE ARE OFFERING THE COVID19 VACCINES IN OUR OFFICE! Ask the front desk for dates!     "Like" [email or web address] on Facebook and Instagram for our latest updates!      A healthy democracy works best when [phone] participate! Make sure you are registered to vote! If you have moved or changed any of your contact information, you will need to get this updated before voting!  In some cases, you MAY be able to register to vote online: [place]

## 2020-03-07 NOTE — Telephone Encounter (Signed)
L/M for patient to contact me to advise approval and submit to [facility] to fill her Harrington Challenger and will need to take call to [employer] for her to restart

## 2020-03-07 NOTE — Telephone Encounter (Signed)
-----   Message from Alfonse Spruce, MD sent at 03/07/2020  1:17 PM EST ----- She wants to restart her Harrington Challenger.
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
## 2020-03-09 NOTE — Telephone Encounter (Signed)
Patient called to inquire about her Harrington Challenger. I advised her I had left her 2 messages to contact me.  She advised her cell phone doesn't work but the home number I left messages at did.  I advised her submit to [facility] her Ins and once she gets call from them and [facility] I can advise delivery and restart date

## 2020-03-18 ENCOUNTER — Ambulatory Visit (INDEPENDENT_AMBULATORY_CARE_PROVIDER_SITE_OTHER): Payer: Medicare Other

## 2020-03-18 ENCOUNTER — Other Ambulatory Visit: Payer: Self-pay

## 2020-03-18 DIAGNOSIS — J4550 Severe persistent asthma, uncomplicated: Secondary | ICD-10-CM | POA: Diagnosis not present

## 2020-03-18 DIAGNOSIS — J4551 Severe persistent asthma with (acute) exacerbation: Secondary | ICD-10-CM

## 2020-05-06 ENCOUNTER — Other Ambulatory Visit: Payer: Self-pay

## 2020-05-06 ENCOUNTER — Ambulatory Visit (INDEPENDENT_AMBULATORY_CARE_PROVIDER_SITE_OTHER): Payer: Medicare Other

## 2020-05-06 DIAGNOSIS — J4551 Severe persistent asthma with (acute) exacerbation: Secondary | ICD-10-CM

## 2020-05-06 DIAGNOSIS — J4550 Severe persistent asthma, uncomplicated: Secondary | ICD-10-CM | POA: Diagnosis not present

## 2020-05-06 MED ORDER — BENRALIZUMAB 30 MG/ML ~~LOC~~ SOSY
30.0000 mg | PREFILLED_SYRINGE | SUBCUTANEOUS | Status: AC
  Administered 2020-05-06 – 2022-07-31 (×14): 30 mg via SUBCUTANEOUS

## 2020-07-08 ENCOUNTER — Ambulatory Visit: Payer: Self-pay

## 2020-07-15 ENCOUNTER — Ambulatory Visit (INDEPENDENT_AMBULATORY_CARE_PROVIDER_SITE_OTHER): Payer: Medicare Other

## 2020-07-15 ENCOUNTER — Other Ambulatory Visit: Payer: Self-pay

## 2020-07-15 DIAGNOSIS — J4550 Severe persistent asthma, uncomplicated: Secondary | ICD-10-CM | POA: Diagnosis not present

## 2020-07-15 DIAGNOSIS — J4551 Severe persistent asthma with (acute) exacerbation: Secondary | ICD-10-CM

## 2020-09-12 ENCOUNTER — Ambulatory Visit: Payer: Self-pay

## 2020-09-21 ENCOUNTER — Telehealth: Payer: Self-pay

## 2020-09-21 NOTE — Telephone Encounter (Signed)
Patient calling to let us know that she had fallen and had fx her lower left side of her back.  She is seeing an orthopedic Dr. next week. Pt. Currently diagnosed with covid today. Rescheduled her fasenra injection for October 5th at 4:00 pm.

## 2020-09-30 ENCOUNTER — Ambulatory Visit: Payer: Self-pay | Admitting: Orthopedic Surgery

## 2020-10-05 ENCOUNTER — Ambulatory Visit (INDEPENDENT_AMBULATORY_CARE_PROVIDER_SITE_OTHER): Payer: Medicare Other

## 2020-10-05 ENCOUNTER — Other Ambulatory Visit: Payer: Self-pay

## 2020-10-05 DIAGNOSIS — J4551 Severe persistent asthma with (acute) exacerbation: Secondary | ICD-10-CM | POA: Diagnosis not present

## 2020-11-23 ENCOUNTER — Telehealth: Payer: Self-pay | Admitting: *Deleted

## 2020-11-23 NOTE — Telephone Encounter (Signed)
L/m for patient to contact clinic to make MD appt for reapproval. L/M on home number mobile voicemail full

## 2020-11-30 ENCOUNTER — Other Ambulatory Visit: Payer: Self-pay

## 2020-11-30 ENCOUNTER — Ambulatory Visit (INDEPENDENT_AMBULATORY_CARE_PROVIDER_SITE_OTHER): Payer: Medicare Other

## 2020-11-30 DIAGNOSIS — J4550 Severe persistent asthma, uncomplicated: Secondary | ICD-10-CM | POA: Diagnosis not present

## 2020-11-30 DIAGNOSIS — J4551 Severe persistent asthma with (acute) exacerbation: Secondary | ICD-10-CM

## 2020-12-13 ENCOUNTER — Telehealth: Payer: Self-pay | Admitting: Allergy & Immunology

## 2020-12-13 DIAGNOSIS — J310 Chronic rhinitis: Secondary | ICD-10-CM

## 2020-12-13 NOTE — Telephone Encounter (Signed)
Pt wants to know what she can take when she's in the shower and doesn't know what to take for someone that has allergies

## 2020-12-13 NOTE — Telephone Encounter (Signed)
Lm for pt to call us back about this °

## 2020-12-19 NOTE — Telephone Encounter (Signed)
Pt is having bad itching all over body randomly. Most of the times it is wen she gets out of the shower. It comes and goes . She is using dove for sensitive skin. She has a dog and 3 cats and wonders if she might have developed an allergy to them now.

## 2020-12-20 NOTE — Telephone Encounter (Signed)
I would add on cetirizine 10 mg 1-2 times a day to help with the hives.  Landed on an environmental allergy panel.  She can go to a [facility] and get that drawn.  She also needs an appointment.  Dr. Maurine Minister is in [facility] full-time and Dr. Marlynn Perking is there part-time.  Malachi Bonds, MD [facility]

## 2020-12-21 NOTE — Telephone Encounter (Signed)
Lm for pt to call us back about this °

## 2020-12-21 NOTE — Telephone Encounter (Signed)
Pt informed of this and stated understanding  

## 2021-01-05 ENCOUNTER — Ambulatory Visit: Payer: Medicare Other | Admitting: Allergy & Immunology

## 2021-01-08 ENCOUNTER — Other Ambulatory Visit: Payer: Self-pay | Admitting: Allergy & Immunology

## 2021-01-25 ENCOUNTER — Ambulatory Visit: Payer: Medicare Other

## 2021-02-01 ENCOUNTER — Encounter: Payer: Self-pay | Admitting: Family Medicine

## 2021-02-01 ENCOUNTER — Ambulatory Visit (INDEPENDENT_AMBULATORY_CARE_PROVIDER_SITE_OTHER): Payer: Medicare Other

## 2021-02-01 ENCOUNTER — Ambulatory Visit: Payer: Medicare Other | Admitting: Family Medicine

## 2021-02-01 ENCOUNTER — Other Ambulatory Visit: Payer: Self-pay

## 2021-02-01 VITALS — BP 124/50 | HR 63 | Temp 97.7°F | Resp 18 | Ht 65.0 in | Wt 275.0 lb

## 2021-02-01 DIAGNOSIS — K219 Gastro-esophageal reflux disease without esophagitis: Secondary | ICD-10-CM

## 2021-02-01 DIAGNOSIS — J4550 Severe persistent asthma, uncomplicated: Secondary | ICD-10-CM | POA: Diagnosis not present

## 2021-02-01 DIAGNOSIS — I509 Heart failure, unspecified: Secondary | ICD-10-CM

## 2021-02-01 DIAGNOSIS — J310 Chronic rhinitis: Secondary | ICD-10-CM

## 2021-02-01 MED ORDER — SPIRIVA RESPIMAT 1.25 MCG/ACT IN AERS: 2.0000 | INHALATION_SPRAY | Freq: Every day | RESPIRATORY_TRACT | 3 refills | Status: DC

## 2021-02-01 MED ORDER — ALBUTEROL SULFATE HFA 108 (90 BASE) MCG/ACT IN AERS: 1.0000 | INHALATION_SPRAY | Freq: Four times a day (QID) | RESPIRATORY_TRACT | 1 refills | Status: DC | PRN

## 2021-02-01 MED ORDER — FLUTICASONE-SALMETEROL 250-50 MCG/ACT IN AEPB: 1.0000 | INHALATION_SPRAY | Freq: Two times a day (BID) | RESPIRATORY_TRACT | 5 refills | Status: DC

## 2021-02-01 NOTE — Progress Notes (Signed)
[address] [place] [address] Dept: [phone]  FOLLOW UP NOTE  Patient ID: Elaine Lee, female    DOB: 08-Jul-1945  Age: 76 y.o. MRN: [id number] Date of Office Visit: 02/01/2021  Assessment  Chief Complaint: Follow-up (Pt report that she have been doing good, she was hospitalized in December for CHF, she also had COVID, which cause her to breathe harder when walking. And that there have been changes to her med's per PCP.) and Asthma  HPI Elaine Lee is a 76 year old female who presents to the clinic for follow-up visit.  She was last seen in this clinic on 03/07/2020 by Dr. Dellis Anes for evaluation of asthma on Fasenra, mixed rhinitis, and reflux.  In the interim, she reports a fall in September with placement in an inpatient rehabilitation center, COVID infection, and recent acute on chronic episode of of heart failure requiring hospitalization in December.  At today's visit, she reports her asthma has been moderately well controlled with symptoms including shortness of breath with any activity and occasional wheezing.  She denies cough.  She  reports that she has been using Spiriva 1.25 mcg 2 puffs once a day and using albuterol infrequently.  She continues to receive her Fasenra injection once every 8 weeks with no large or local reactions.  She reports a significant decrease in her symptoms of asthma while continuing on Fasenra injections. She reports that she has stopped using her other inhalers including Advair, Wixela, and Symbicort.  She is not using Pulmicort via nebulizer at this time.  A call to her pharmacy indicates that she does have an active prescription for Wixela.  She has obstructive sleep apnea and does not wear her CPAP at night as the mask does not fit at this time.  She reports frequent daytime somnolence.  She has an upcoming visit with pulmonology specialty to evaluate obstructive sleep apnea.  Allergic rhinitis is reported as moderately well controlled  with symptoms including clear rhinorrhea, nasal congestion, and occasional sneezing bouts.  She continues ipratropium nasal spray and azelastine nasal spray as needed.  Reflux is reported as only occasional heartburn for which she continues Pepcid 40 mg once a day and Protonix 40 mg once a day. Her current medications are listed in the chart.   Drug Allergies:  Allergies  Allergen Reactions   Other Shortness Of Breath and Other (See Comments)    ROSEMARY AND HEAVY SCENTED PERFUMES, DETERGENTS, ETC   Tape Itching, Rash and Other (See Comments)    Burns and blisters USE PAPER TAPE   Trazodone And Nefazodone Shortness Of Breath and Swelling   Celebrex [Celecoxib] Other (See Comments)    HARD TIME HEARING, LIKE BEING UNDERWATER   Latex Hives, Itching and Rash   Mysoline [Primidone] Other (See Comments)    unknown   Niacin And Related Hives and Swelling   Sulfa Antibiotics Other (See Comments)    TONGUE BLISTERS     Ciprofloxacin Nausea Only   Gramineae Pollens     Other reaction(s): Other (See Comments) Sneeze and cough   Aspirin Other (See Comments)    GI PROBLEMS AND PAIN    Physical Exam: BP (!) 124/50    Pulse 63    Temp 97.7 F (36.5 C) (Temporal)    Resp 18    Ht 5\' 5"  (1.651 m)    Wt 275 lb (124.7 kg)    SpO2 98%    BMI 45.76 kg/m    Physical Exam Vitals reviewed.  Constitutional:      Appearance: Normal appearance.  HENT:     Head: Normocephalic and atraumatic.     Nose:     Comments: Bilateral naris slightly erythematous with clear nasal drainage noted.  Pharynx normal.  Ears normal.  Eyes normal.    Mouth/Throat:     Pharynx: Oropharynx is clear.  Eyes:     Conjunctiva/sclera: Conjunctivae normal.  Cardiovascular:     Rate and Rhythm: Normal rate and regular rhythm.     Heart sounds: Normal heart sounds. No murmur heard. Pulmonary:     Effort: Pulmonary effort is normal.     Breath sounds: Normal breath sounds.     Comments: Lungs clear to  auscultation Musculoskeletal:        General: Normal range of motion.     Cervical back: Normal range of motion and neck supple.  Skin:    General: Skin is warm and dry.  Neurological:     Mental Status: She is alert and oriented to person, place, and time.  Psychiatric:        Mood and Affect: Mood normal.        Behavior: Behavior normal.        Thought Content: Thought content normal.        Judgment: Judgment normal.    Diagnostics: FVC 1.08, FEV1 0.77.  Predicted FVC 2.70, predicted FEV1 2.06.  Spirometry indicates severe restriction.  Postbronchodilator FVC 1.23, FEV1 0.93.  Postbronchodilator spirometry indicates 21% improvement in FEV1.  Assessment and Plan: 1. Not well controlled severe persistent asthma   2. Mixed rhinitis   3. Gastroesophageal reflux disease, unspecified whether esophagitis present   4. Congestive heart failure, unspecified HF chronicity, unspecified heart failure type (HCC)     Meds ordered this encounter  Medications   Tiotropium Bromide Monohydrate (SPIRIVA RESPIMAT) 1.25 MCG/ACT AERS    Sig: Inhale 2 puffs into the lungs daily.    Dispense:  1 each    Refill:  3   albuterol (VENTOLIN HFA) 108 (90 Base) MCG/ACT inhaler    Sig: Inhale 1-2 puffs into the lungs every 6 (six) hours as needed for wheezing or shortness of breath.    Dispense:  18 g    Refill:  1   fluticasone-salmeterol (WIXELA INHUB) 250-50 MCG/ACT AEPB    Sig: Inhale 1 puff into the lungs in the morning and at bedtime.    Dispense:  1 each    Refill:  5    Patient Instructions  Asthma Restart Wixela 250-1 puff twice a day to prevent cough or wheeze Continue Spiriva 1.25 mcg 2 puffs once a day to prevent cough and wheeze Continue albuterol 2 puffs once every 4 hours as needed for cough or wheeze You may use albuterol 2 puffs 5 to 15 minutes before activity to decrease cough or wheeze Continue to receive Fasenra injections once every 8 weeks to prevent asthma  symptoms  Allergic rhinitis Continue allergen avoidance measures directed toward mold as listed below Continue Atrovent nasal spray 2 sprays in each nostril twice a day as needed for runny nose Continue azelastine nasal spray 2 sprays in each nostril twice a day as needed for runny nose Consider saline nasal rinses as needed for nasal symptoms. Use this before any medicated nasal sprays for best result Return to the clinic to update your environmental allergy testing. Remember to stop your antihistamines for 3 days before the testing appointment  Reflux Continue dietary and lifestyle modifications as listed below  Continue pantoprazole 40 mg every morning famotidine 40 mg twice a day as previously prescribed  Obstructive sleep apnea Keep your appointment with your pulmonary specialist for evaluation and treatment of sleep apnea  Call the clinic if this treatment plan is not working well for you.  Follow up in 1 month or sooner if needed.   Return in about 4 weeks (around 03/01/2021), or if symptoms worsen or fail to improve.    Thank you for the opportunity to care for this patient.  Please do not hesitate to contact me with questions.  Thermon LeylandAnne Latania Bascomb, FNP [facility]

## 2021-02-01 NOTE — Patient Instructions (Signed)
Asthma Restart Wixela 250-1 puff twice a day to prevent cough or wheeze Continue Spiriva 1.25 mcg 2 puffs once a day to prevent cough and wheeze Continue albuterol 2 puffs once every 4 hours as needed for cough or wheeze You may use albuterol 2 puffs 5 to 15 minutes before activity to decrease cough or wheeze Continue to receive Fasenra injections once every 8 weeks to prevent asthma symptoms  Allergic rhinitis Continue allergen avoidance measures directed toward mold as listed below Continue Atrovent nasal spray 2 sprays in each nostril twice a day as needed for runny nose Continue azelastine nasal spray 2 sprays in each nostril twice a day as needed for runny nose Consider saline nasal rinses as needed for nasal symptoms. Use this before any medicated nasal sprays for best result Return to the clinic to update your environmental allergy testing. Remember to stop your antihistamines for 3 days before the testing appointment  Reflux Continue dietary and lifestyle modifications as listed below Continue pantoprazole 40 mg every morning famotidine 40 mg twice a day as previously prescribed  Obstructive sleep apnea Keep your appointment with your pulmonary specialist for evaluation and treatment of sleep apnea   Call the clinic if this treatment plan is not working well for you.  Follow up in 1 month or sooner if needed.  Control of Mold Allergen Mold and fungi can grow on a variety of surfaces provided certain temperature and moisture conditions exist.  Outdoor molds grow on plants, decaying vegetation and soil.  The major outdoor mold, Alternaria and Cladosporium, are found in very high numbers during hot and dry conditions.  Generally, a late Summer - Fall peak is seen for common outdoor fungal spores.  Rain will temporarily lower outdoor mold spore count, but counts rise rapidly when the rainy period ends.  The most important indoor molds are Aspergillus and Penicillium.  Dark, humid and  poorly ventilated basements are ideal sites for mold growth.  The next most common sites of mold growth are the bathroom and the kitchen.  Outdoor [employer] Use air conditioning and keep windows closed Avoid exposure to decaying vegetation. Avoid leaf raking. Avoid grain handling. Consider wearing a face mask if working in moldy areas.  Indoor Mold Control Maintain humidity below 50%. Clean washable surfaces with 5% bleach solution. Remove sources e.g. Contaminated carpets.   Lifestyle Changes for Controlling GERD When you have GERD, stomach acid feels as if its backing up toward your mouth. Whether or not you take medication to control your GERD, your symptoms can often be improved with lifestyle changes.   Raise Your Head Reflux is more likely to strike when youre lying down flat, because stomach fluid can flow backward more easily. Raising the head of your bed 4-6 inches can help. To do this: Slide blocks or books under the legs at the head of your bed. Or, place a wedge under the mattress. Many foam stores can make a suitable wedge for you. The wedge should run from your waist to the top of your head. Dont just prop your head on several pillows. This increases pressure on your stomach. It can make GERD worse.  Watch Your Eating Habits Certain foods may increase the acid in your stomach or relax the lower esophageal sphincter, making GERD more likely. Its best to avoid the following: Coffee, tea, and carbonated drinks (with and without caffeine) Fatty, fried, or spicy food Mint, chocolate, onions, and tomatoes Any other foods that seem to irritate your stomach  or cause you pain  Relieve the Pressure Eat smaller meals, even if you have to eat more often. Dont lie down right after you eat. Wait a few hours for your stomach to empty. Avoid tight belts and tight-fitting clothes. Lose excess weight.  Tobacco and Alcohol Avoid smoking tobacco and drinking alcohol.  They can make GERD symptoms worse.

## 2021-02-02 ENCOUNTER — Telehealth: Payer: Self-pay | Admitting: Family Medicine

## 2021-02-02 NOTE — Telephone Encounter (Signed)
Patient Elaine Lee) Called stating Gareth Morgan NP prescribed Sprivia 1.25 Patient had a 3 month supply already at home that is 2.5 asking if this dosage would be ok Gareth Morgan stated this would be fine advised patient per anne 1 puff per day.

## 2021-02-02 NOTE — Telephone Encounter (Signed)
She has a follow up appointment on the books before she will need a refill of the Spiriva so lets hold on the reorder of Spiriva for now please. Thank you

## 2021-02-02 NOTE — Telephone Encounter (Signed)
Can you please let this patient know that she can use the spiriva 2.5 mcg dose that she already has. Please have her use Spiriva 2.5 mcg 1 puff once a day Instead of Spiriva 1.25 mcg 2 puffs once a day.  Thank you

## 2021-02-03 ENCOUNTER — Telehealth: Payer: Self-pay | Admitting: Family Medicine

## 2021-02-03 MED ORDER — ALBUTEROL SULFATE HFA 108 (90 BASE) MCG/ACT IN AERS: 2.0000 | INHALATION_SPRAY | RESPIRATORY_TRACT | 5 refills | Status: DC | PRN

## 2021-02-03 NOTE — Telephone Encounter (Signed)
Patient called stating the RX albuterol (VENTOLIN HFA) 108 (90 Base) MCG/ACT inhaler [[id number]] was not covered by her insurance asking for an alternative please advise

## 2021-02-03 NOTE — Telephone Encounter (Signed)
Brand ventolin is preferred so went that to pharmacy

## 2021-02-07 ENCOUNTER — Telehealth: Payer: Self-pay

## 2021-02-07 NOTE — Telephone Encounter (Signed)
Attempted to contact patient to schedule a Palliative Care consult appointment. No answer left a message on home number to return call. Mobile number voicemail was full.

## 2021-02-13 ENCOUNTER — Telehealth: Payer: Self-pay

## 2021-02-13 NOTE — Telephone Encounter (Signed)
Attempted to contact patient to schedule a Palliative Care consult appointment. No answer left a message on home number to return call. Mobile voicemail was full.

## 2021-02-13 NOTE — Telephone Encounter (Signed)
Spoke with patient and scheduled a Mychart Palliative Consult for 02/20/21 @        2 PM.  Consent obtained; updated Outlook/Netsmart/Team List and Epic.

## 2021-02-20 ENCOUNTER — Telehealth: Payer: Medicare Other | Admitting: Internal Medicine

## 2021-02-20 ENCOUNTER — Other Ambulatory Visit: Payer: Self-pay

## 2021-02-20 DIAGNOSIS — F339 Major depressive disorder, recurrent, unspecified: Secondary | ICD-10-CM

## 2021-02-20 DIAGNOSIS — Z515 Encounter for palliative care: Secondary | ICD-10-CM

## 2021-02-20 DIAGNOSIS — I509 Heart failure, unspecified: Secondary | ICD-10-CM

## 2021-02-20 DIAGNOSIS — M792 Neuralgia and neuritis, unspecified: Secondary | ICD-10-CM

## 2021-02-20 NOTE — Progress Notes (Signed)
[facility] Consult Note Telephone: [phone]  Fax: [phone]   Date of encounter: 02/20/21 10:04 AM PATIENT NAME: Elaine Lee [address] [place] [address]   [phone] (home) [phone] (work) DOB: [birth year] MRN: [id number] PRIMARY CARE PROVIDER:    Stephens NovemberMelissa Fowler, PA-C  REFERRING PROVIDER:   Stephens NovemberMelissa Fowler, PA-C  RESPONSIBLE PARTY:    Contact Information     Name Relation Home Work [place]   Kathee PoliteUCKER, BEVERLY Daughter   [phone]      Due to the COVID-19 crisis, this visit was done via telemedicine from my office and it was initiated and consent by this patient and or family.  I connected with  Elaine Lee OR PROXY on 02/20/21 by a video enabled telemedicine application and verified that I am speaking with the correct person using two identifiers.   I discussed the limitations of evaluation and management by telemedicine. The patient expressed understanding and agreed to proceed.   Palliative Care was asked to follow this patient by consultation request of  Stephens NovemberMelissa Fowler, PA-C, to address advance care planning and complex medical decision making. This is the initial visit.                                     ASSESSMENT AND PLAN / RECOMMENDATIONS:   Advance Care Planning/Goals of Care: Goals include to maximize quality of life and symptom management. Patient/health care surrogate gave his/her permission to discuss.Our advance care planning conversation included a discussion about:    The value and importance of advance care planning  Experiences with loved ones who have been seriously ill or have died  Exploration of personal, cultural or spiritual beliefs that might influence medical decisions  Exploration of goals of care in the event of a sudden injury or illness  Identification  of a healthcare- POA-her daughter; has living will also Review and updating or creation of an  advance  directive document . Decision not to resuscitate or to de-escalate disease focused treatments due to poor prognosis. CODE STATUS:  FULL CODE  Symptom Management/Plan: 1. Neuropathic pain -f/u with Dr. Ethelene Halamos for ongoing injections that she reports are helpful, gabapentin, tylenol  2. Chronic congestive heart failure, unspecified heart failure type (HCC) -check daily wts with call parameters and extra diuretic when needed, monitor edema, avoid high sodium foods, continue diuretic regimen per cardiology   3. Palliative care by specialist -FULL CODE adamantly stated by pt -f/u at home  4.  Depression:   -not on medication based on epic list -will assess further at in-person eval  Follow up Palliative Care Visit: Palliative care will continue to follow for complex medical decision making, advance care planning, and clarification of goals. Return 4 weeks or prn.  Video connection was lost when less than 50% of the duration of the visit was complete, at which time the remainder of the visit was completed via audio only.  This visit was coded based on medical decision making (MDM).  PPS: 50%  HOSPICE ELIGIBILITY/DIAGNOSIS: TBD  Chief Complaint: new palliative mychart consult  HISTORY OF PRESENT ILLNESS:  Elaine Lee is a 76 y.o. year old female  with heart failure preserved EF, CKD3, DMII, prior toe amputation, COPD, chronic respiratory failure, OSA, morbid obesity, afib, asthma, allergic rhinitis, and gerd.    She was hospitalized in Dec with CHF, also had COVID. 01/27/21 dry  weight 276 lbs.  She has days she has to take extra lasix.  Gets a lot of exercise going back and forth to the bathroom.  Can never make it when she has to take extra.  She does well with the low sodium part but not the diabetic part.    Forgets her insulin shot sometimes.  Sometimes she skips breakfast so doesn't need insulin.  Has not gotten lows.    She's depressed b/c never by herself her whole life.   Husband died  2.5 yrs ago and son moved out 5-6 mos ago.  Is lonely at night and gets scared.  Son went to detox center and now in group home--has to pay a lot but is doing well.  She misses him.  She'd like to get out to a senior citizen place, but she's been too sick to go.  Plans to get a lift and go.    She was having an OT assessment when we first tried to connect, but he reports she has no needs.  Shower does not go too well--has to swing on a grab bar to get out of the shower.  Has torn rotator cuffs so can't hang up her clothes and do laundry.  Has a hard time moving laundry.  Has severe neuropathy in feet so balance poor.  When she was with [facility], she had help with this.  She left them when her husband passed away.  She misses having someone to talk to.  No longer has that fellowship.    Has 4 kitty cats.  Lost her youngest daughter on drugs.  Lost her baby sister the same year.  For a while, she didn't care if she lived or died.  Doesn't say that anymore.  Doesn't have friends.  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  Many friends have died.    Uses a rollator walker.  Struggles to clean also.  Quit cooking on gas stove.  Uses crockpot, toaster and microwave.  She'd like to have a caregiver to help with some of these things.    She has lived here 67 yrs in her home.  Her daughter wants her to sell her home.  She'd like to be in a place with other seniors, but not a nursing home.  Gets on facebook to see what grandkids are doing.  Orders groceries from [employer] and the bring them in and put them on the couch and she totes her rollator over, puts them on there and then uses a rolling chair to push it.    She had one knee replacement and needs another one.  Her daughter will take her to appts.  If she's busy, she gets medical ride from [facility] (27/yr)  Martin Majestic to therapy pool, but could not get up steps to get out.  Takes 2400mg  of gabapentin daily for neuropathy.  Still bothered by this.  Can't  climb stairs due to that.  Dr. Nelva Bush had put a shot in her back before on one side and now may need on other side.  These ran up her hba1c.    Has afib and says it "cuts up" sometimes.    She took care of both of her parents which had leukemia.  Her sister died in a fire.    History obtained from review of EMR, discussion with primary team, and interview with family, facility staff/caregiver and/or Ms. Staller.   I reviewed available labs, medications, imaging, studies and related documents  from the EMR.  Records reviewed and summarized above.   ROS  General: NAD EYES: denies vision changes ENMT: denies dysphagia Cardiovascular: denies chest pain, denies DOE Pulmonary: denies cough, has increased SOB Abdomen: endorses good appetite, denies constipation, endorses continence of bowel GU: denies dysuria, endorses incontinence of urine MSK:  has increased weakness,  no falls reported, rotator cuff injuries Skin: denies rashes or wounds Neurological: denies pain, denies insomnia Psych: Endorses depression--several losses Heme/lymph/immuno: denies bruises, abnormal bleeding  Physical Exam: Current and past weights: 275 lbs at 58ft 5 in on 02/02/21 down from 282 lbs in 06/2019  CURRENT PROBLEM LIST:  Patient Active Problem List   Diagnosis Date Noted   Mixed rhinitis 02/01/2021   Gastroesophageal reflux disease 01/22/2019   Severe persistent asthma 11/06/2018   Allergic rhinitis with a predominantly nonallergic component 11/06/2018   Cough, persistent 11/06/2018   Osteoarthritis of right knee 12/05/2017   Infection due to parainfluenza virus 3 06/27/2017   Atrial fibrillation with RVR ([place]) 06/24/2017   UTI (urinary tract infection) 04/05/2016   Pleuritic chest pain 04/05/2016   AKI (acute kidney injury) ([place]) 04/05/2016   Tremor 04/05/2016   Fall    Severe persistent asthma with acute exacerbation 10/25/2014   OSA (obstructive sleep apnea) 10/25/2014   Morbid obesity ([place])  10/25/2014   Acute on chronic respiratory failure with hypoxia (HCC)    DM2 (diabetes mellitus, type 2) ([place]) 06/14/2014   CKD (chronic kidney disease), stage III ([place]) 06/14/2014   Neuropathic pain 06/14/2014   HTN (hypertension) 06/14/2014   Type 2 diabetes with stage 3 chronic kidney disease GFR 30-59 ([place])    COPD exacerbation ([place]) 06/13/2014   CHF (congestive heart failure) ([place]) 06/13/2014   PAST MEDICAL HISTORY:  Active Ambulatory Problems    Diagnosis Date Noted   COPD exacerbation ([place]) 06/13/2014   CHF (congestive heart failure) ([place]) 06/13/2014   DM2 (diabetes mellitus, type 2) ([place]) 06/14/2014   CKD (chronic kidney disease), stage III ([place]) 06/14/2014   Neuropathic pain 06/14/2014   HTN (hypertension) 06/14/2014   Type 2 diabetes with stage 3 chronic kidney disease GFR 30-59 ([place])    Acute on chronic respiratory failure with hypoxia (HCC)    Severe persistent asthma with acute exacerbation 10/25/2014   OSA (obstructive sleep apnea) 10/25/2014   Morbid obesity ([place]) 10/25/2014   Fall    UTI (urinary tract infection) 04/05/2016   Pleuritic chest pain 04/05/2016   AKI (acute kidney injury) ([place]) 04/05/2016   Tremor 04/05/2016   Atrial fibrillation with RVR ([place]) 06/24/2017   Infection due to parainfluenza virus 3 06/27/2017   Osteoarthritis of right knee 12/05/2017   Severe persistent asthma 11/06/2018   Allergic rhinitis with a predominantly nonallergic component 11/06/2018   Cough, persistent 11/06/2018   Gastroesophageal reflux disease 01/22/2019   Mixed rhinitis 02/01/2021   Resolved Ambulatory Problems    Diagnosis Date Noted   Congestive heart failure ([place]) 06/14/2014   Acute UTI 04/04/2016   COPD (chronic obstructive pulmonary disease) ([place]) 06/24/2017   Past Medical History:  Diagnosis Date   A-fib ([place])    Asthma    Chronic edema    Degenerative lumbar disc    Depression    Diabetes mellitus without complication (HCC)    Diabetic neuropathy (HCC)     GERD (gastroesophageal reflux disease)    Gout    Hearing loss    Hypercholesteremia    Hypertension    Insomnia    Obstructive sleep apnea on CPAP  Osteoarthritis    Renal disorder    Urinary frequency    SOCIAL HX:  Social History   Tobacco Use   Smoking status: Never   Smokeless tobacco: Never  Substance Use Topics   Alcohol use: No    Alcohol/week: 0.0 standard drinks   FAMILY HX:  Family History  Problem Relation Age of Onset   Allergies Mother    Rheumatologic disease Mother    Cancer Mother        leukemia   Allergies Father    Asthma Father    Heart disease Father    Rheumatologic disease Father    Cancer Father        leukemia   Allergies Daughter    Cancer Sister    Breast cancer Maternal Aunt    Breast cancer Paternal Aunt    Breast cancer Maternal Aunt    Allergic rhinitis Neg Hx    Eczema Neg Hx    Urticaria Neg Hx       ALLERGIES:  Allergies  Allergen Reactions   Other Shortness Of Breath and Other (See Comments)    ROSEMARY AND HEAVY SCENTED PERFUMES, DETERGENTS, ETC   Tape Itching, Rash and Other (See Comments)    Burns and blisters USE PAPER TAPE   Trazodone And Nefazodone Shortness Of Breath and Swelling   Celebrex [Celecoxib] Other (See Comments)    HARD TIME HEARING, LIKE BEING UNDERWATER   Latex Hives, Itching and Rash   Mysoline [Primidone] Other (See Comments)    unknown   Niacin And Related Hives and Swelling   Sulfa Antibiotics Other (See Comments)    TONGUE BLISTERS     Ciprofloxacin Nausea Only   Gramineae Pollens     Other reaction(s): Other (See Comments) Sneeze and cough   Aspirin Other (See Comments)    GI PROBLEMS AND PAIN     PERTINENT MEDICATIONS:  Outpatient Encounter Medications as of 02/20/2021  Medication Sig   acetaminophen (TYLENOL) 500 MG tablet Take 500 mg by mouth every 6 (six) hours as needed.   albuterol (ACCUNEB) 0.63 MG/3ML nebulizer solution Take 1 ampule by nebulization 3 (three) times daily  as needed for wheezing or shortness of breath.   albuterol (VENTOLIN HFA) 108 (90 Base) MCG/ACT inhaler Inhale 1-2 puffs into the lungs every 6 (six) hours as needed for wheezing or shortness of breath.   albuterol (VENTOLIN HFA) 108 (90 Base) MCG/ACT inhaler Inhale 2 puffs into the lungs every 4 (four) hours as needed for wheezing or shortness of breath.   amLODipine (NORVASC) 10 MG tablet Take 10 mg by mouth daily.   atorvastatin (LIPITOR) 10 MG tablet Take 10 mg by mouth at bedtime.    azelastine (ASTELIN) 0.1 % nasal spray Place 2 sprays into both nostrils 2 (two) times daily. Use in each nostril as directed   Bismuth Subsalicylate 99991111 MG TABS Take by mouth.   budesonide (PULMICORT) 0.5 MG/2ML nebulizer solution Take 2 mLs (0.5 mg total) by nebulization in the morning and at bedtime.   Cholecalciferol (VITAMIN D3) 125 MCG (5000 UT) CAPS Take 5,000 Units by mouth daily.   diltiazem (CARDIZEM CD) 240 MG 24 hr capsule Take 300 mg by mouth daily. For blood pressure   diltiazem (TIAZAC) 240 MG 24 hr capsule Take by mouth.   famotidine (PEPCID) 20 MG tablet Take 20 mg by mouth 2 (two) times daily.   FASENRA 30 MG/ML SOSY INJECT 30MG  SUBCUTANEOUSLY  EVERY 8 WEEKS   fluticasone-salmeterol (WIXELA INHUB) 250-50  MCG/ACT AEPB Inhale 1 puff into the lungs in the morning and at bedtime.   furosemide (LASIX) 40 MG tablet Take 40 mg by mouth daily.    gabapentin (NEURONTIN) 600 MG tablet Take 1 tablet (600 mg total) by mouth 2 (two) times daily. (Patient taking differently: Take 600-1,200 mg by mouth See admin instructions. Take 600 mg by mouth in the morning and take 1200 mg by mouth at bedtime)   guaiFENesin (MUCINEX) 600 MG 12 hr tablet Take 600 mg by mouth 2 (two) times daily.   Insulin Glargine-yfgn 100 UNIT/ML SOPN INJECT 34 UNITS UNDER SKIN DAILY   liraglutide (VICTOZA) 18 MG/3ML SOPN Inject 1.8 mg into the skin daily. For blood sugar control   loratadine (CLARITIN) 10 MG tablet Take 10 mg by mouth  every morning.   losartan (COZAAR) 100 MG tablet Take 100 mg by mouth daily.   melatonin 5 MG TABS Take 5 mg by mouth at bedtime. (Patient not taking: Reported on 03/07/2020)   Menthol, Topical Analgesic, 4 % GEL Apply topically. Apply topically as needed to painful joints   mirabegron ER (MYRBETRIQ) 25 MG TB24 tablet Take 50 mg by mouth daily. For bladder control   montelukast (SINGULAIR) 10 MG tablet Take 1 tablet (10 mg total) by mouth at bedtime.   Mouthwashes (BIOTENE DRY MOUTH GENTLE) LIQD Use as directed in the mouth or throat. 1 cap full Q2 hr PRN for dry mouth   nystatin (MYCOSTATIN/NYSTOP) powder Apply 1 g topically 4 (four) times daily as needed (for skin folds).    pantoprazole (PROTONIX) 40 MG tablet Take 1 tablet (40 mg total) by mouth daily.   polyethylene glycol (MIRALAX / GLYCOLAX) packet Take 17 g by mouth daily. (Patient taking differently: Take 17 g by mouth daily as needed for moderate constipation.)   predniSONE (DELTASONE) 10 MG tablet Take 1 tablet (10 mg total) by mouth daily with breakfast.   rivaroxaban (XARELTO) 20 MG TABS tablet Take 20 mg by mouth daily with supper. (Patient not taking: Reported on 03/07/2020)   rOPINIRole (REQUIP) 0.5 MG tablet Take 1 mg by mouth at bedtime.   tetrahydrozoline 0.05 % ophthalmic solution Place 1 drop into both eyes 4 (four) times daily as needed (for eye irritation).    Tiotropium Bromide Monohydrate (SPIRIVA RESPIMAT) 1.25 MCG/ACT AERS Inhale 2 puffs into the lungs daily.   traMADol (ULTRAM) 50 MG tablet Take 50 mg by mouth every 6 (six) hours as needed. (Patient not taking: Reported on 03/07/2020)   Facility-Administered Encounter Medications as of 02/20/2021  Medication   Benralizumab SOSY 30 mg   Benralizumab SOSY 30 mg   Thank you for the opportunity to participate in the care of Ms. Schirmer.  The palliative care team will continue to follow. Please call our office at [phone] if we can be of additional assistance.   Hollace Kinnier, DO   COVID-19 PATIENT SCREENING TOOL Asked and negative response unless otherwise noted:  Have you had symptoms of covid, tested positive or been in contact with someone with symptoms/positive test in the past 5-10 days? no

## 2021-02-25 ENCOUNTER — Encounter: Payer: Self-pay | Admitting: Internal Medicine

## 2021-02-28 ENCOUNTER — Other Ambulatory Visit: Payer: Self-pay

## 2021-02-28 MED ORDER — EPINEPHRINE 0.3 MG/0.3ML IJ SOAJ
0.3000 mg | Freq: Once | INTRAMUSCULAR | 1 refills | Status: DC
Start: 2021-02-28 — End: 2021-12-05

## 2021-03-01 ENCOUNTER — Ambulatory Visit (INDEPENDENT_AMBULATORY_CARE_PROVIDER_SITE_OTHER): Payer: Medicare Other | Admitting: Family Medicine

## 2021-03-01 ENCOUNTER — Encounter: Payer: Self-pay | Admitting: Family Medicine

## 2021-03-01 ENCOUNTER — Other Ambulatory Visit: Payer: Self-pay

## 2021-03-01 VITALS — BP 120/70 | HR 63 | Temp 97.8°F | Resp 20

## 2021-03-01 DIAGNOSIS — K219 Gastro-esophageal reflux disease without esophagitis: Secondary | ICD-10-CM | POA: Diagnosis not present

## 2021-03-01 DIAGNOSIS — G4733 Obstructive sleep apnea (adult) (pediatric): Secondary | ICD-10-CM

## 2021-03-01 DIAGNOSIS — J310 Chronic rhinitis: Secondary | ICD-10-CM

## 2021-03-01 DIAGNOSIS — J4550 Severe persistent asthma, uncomplicated: Secondary | ICD-10-CM | POA: Diagnosis not present

## 2021-03-01 DIAGNOSIS — I509 Heart failure, unspecified: Secondary | ICD-10-CM

## 2021-03-01 NOTE — Patient Instructions (Addendum)
Asthma ?Continue Wixela 250-1 puff twice a day to prevent cough or wheeze ?Continue Spiriva 1.25 mcg 2 puffs once a day to prevent cough and wheeze ?Continue albuterol 2 puffs once every 4 hours as needed for cough or wheeze ?You may use albuterol 2 puffs 5 to 15 minutes before activity to decrease cough or wheeze ?Continue to receive Fasenra injections once every 8 weeks to prevent asthma symptoms ? ?Allergic rhinitis ?Your skin testing to environmental allergens was positive to molds.   ?Continue allergen avoidance measures directed toward mold as listed below ?Continue Atrovent nasal spray 2 sprays in each nostril twice a day as needed for runny nose ?Continue azelastine nasal spray 2 sprays in each nostril twice a day as needed for runny nose ?Consider saline nasal rinses as needed for nasal symptoms. Use this before any medicated nasal sprays for best result ? ?Reflux ?Continue dietary and lifestyle modifications as listed below ? ?Obstructive sleep apnea ?Keep your appointment with your pulmonary specialist for evaluation and treatment of sleep apnea ? ?Call the clinic if this treatment plan is not working well for you. ? ?Follow up in 4 months or sooner if needed. ? ?Control of Mold Allergen ?Mold and fungi can grow on a variety of surfaces provided certain temperature and moisture conditions exist.  Outdoor molds grow on plants, decaying vegetation and soil.  The major outdoor mold, Alternaria and Cladosporium, are found in very high numbers during hot and dry conditions.  Generally, a late Summer - Fall peak is seen for common outdoor fungal spores.  Rain will temporarily lower outdoor mold spore count, but counts rise rapidly when the rainy period ends.  The most important indoor molds are Aspergillus and Penicillium.  Dark, humid and poorly ventilated basements are ideal sites for mold growth.  The next most common sites of mold growth are the bathroom and the kitchen. ? ?Outdoor [employer] ?Use air  conditioning and keep windows closed ?Avoid exposure to decaying vegetation. ?Avoid leaf raking. ?Avoid grain handling. ?Consider wearing a face mask if working in moldy areas. ? ?Indoor Mold Control ?Maintain humidity below 50%. ?Clean washable surfaces with 5% bleach solution. ?Remove sources e.g. Contaminated carpets. ? ? ?Lifestyle Changes for Controlling GERD ?When you have GERD, stomach acid feels as if it?s backing up toward your mouth. ?Whether or not you take medication to control your GERD, your symptoms can often be ?improved with lifestyle changes.  ? ?Raise Your Head ?Reflux is more likely to strike when you?re lying down flat, because stomach fluid can ?flow backward more easily. Raising the head of your bed 4-6 inches can help. To do this: ?Slide blocks or books under the legs at the head of your bed. Or, place a wedge under ?the mattress. Many foam stores can make a suitable wedge for you. The wedge ?should run from your waist to the top of your head. ?Don?t just prop your head on several pillows. This increases pressure on your ?stomach. It can make GERD worse. ? ?Watch Your Eating Habits ?Certain foods may increase the acid in your stomach or relax the lower esophageal ?sphincter, making GERD more likely. It?s best to avoid the following: ?Coffee, tea, and carbonated drinks (with and without caffeine) ?Fatty, fried, or spicy food ?Mint, chocolate, onions, and tomatoes ?Any other foods that seem to irritate your stomach or cause you pain ? ?Relieve the Pressure ?Eat smaller meals, even if you have to eat more often. ?Don?t lie down right after you eat.  Wait a few hours for your stomach to empty. ?Avoid tight belts and tight-fitting clothes. ?Lose excess weight. ? ?Tobacco and Alcohol ?Avoid smoking tobacco and drinking alcohol. They can make GERD symptoms worse. ? ? ?

## 2021-03-01 NOTE — Progress Notes (Signed)
? ?[address] ?[place] [address] ?Dept: [phone] ? ?FOLLOW UP NOTE ? ?Patient ID: Elaine Lee, female    DOB: 06-Jun-1945  Age: 76 y.o. MRN: [id number] ?Date of Office Visit: 03/01/2021 ? ?Assessment  ?Chief Complaint: Allergy Testing ? ?HPI ?Elaine Lee is a 76 year old female who presents to the clinic for follow-up visit with environmental allergy skin testing.  She was last seen in this clinic on 02/01/2021 by Thermon Leyland, FNP, for evaluation of not well controlled asthma, allergic rhinitis, reflux, and obstructive sleep apnea.  At today's visit, she reports her asthma has been moderately well controlled with symptoms including shortness of breath with any activity and occasional wheeze.  She denies cough at this time.  She continues Wixela 1 puff twice a day, Spiriva 1.25 mcg - 2 puffs once a day and rarely uses albuterol.  She continues to receive Fasenra injections with no large or local reactions.  She reports a significant decrease in her symptoms of asthma while continuing on Fasenra injections.  She does report a significant weight gain over the last couple of weeks and she reports that she is going to her cardiology specialist after lunch for further evaluation of fluid volume status.  Allergic rhinitis is reported as moderately well controlled with symptoms including occasional clear rhinorrhea, nasal congestion, and occasional sneezing bouts.  She continues ipratropium and azelastine nasal sprays as needed.   Her last environmental allergy skin testing was on 11/06/2018 and was positive to mold mix 4.  She does have 1 cat in the home.  Reflux is reported as moderately well controlled with only occasional heartburn for which she continues famotidine 40 mg once a day and Protonix 40 mg once a day.  She reports that she has an appointment coming up on 03/06/2021 with her pulmonary specialist, Jacquelyne Balint, for management of obstructive sleep apnea.  Her current medications are listed  in the chart.  ? ?Drug Allergies:  ?Allergies  ?Allergen Reactions  ? Other Shortness Of Breath and Other (See Comments)  ?  ROSEMARY AND HEAVY SCENTED PERFUMES, DETERGENTS, ETC  ? Tape Itching, Rash and Other (See Comments)  ?  Burns and blisters USE PAPER TAPE  ? Trazodone And Nefazodone Shortness Of Breath and Swelling  ? Celebrex [Celecoxib] Other (See Comments)  ?  HARD TIME HEARING, LIKE BEING UNDERWATER  ? Latex Hives, Itching and Rash  ? Mysoline [Primidone] Other (See Comments)  ?  unknown  ? Niacin And Related Hives and Swelling  ? Sulfa Antibiotics Other (See Comments)  ?  TONGUE BLISTERS  ?  ? Ciprofloxacin Nausea Only  ? Gramineae Pollens   ?  Other reaction(s): Other (See Comments) ?Sneeze and cough  ? Aspirin Other (See Comments)  ?  GI PROBLEMS AND PAIN  ? ? ?Physical Exam: ?BP 120/70   Pulse 63   Temp 97.8 ?F (36.6 ?C) (Temporal)   Resp 20   SpO2 96%   ? ?Physical Exam ?Vitals reviewed.  ?Constitutional:   ?   Appearance: Normal appearance.  ?HENT:  ?   Head: Normocephalic and atraumatic.  ?   Right Ear: Tympanic membrane normal.  ?   Left Ear: Tympanic membrane normal.  ?   Nose:  ?   Comments: Bilateral nares slightly erythematous with clear nasal drainage noted.  Pharynx normal.  Ears normal.  Eyes normal. ?   Mouth/Throat:  ?   Pharynx: Oropharynx is clear.  ?Eyes:  ?   Conjunctiva/sclera: Conjunctivae  normal.  ?Cardiovascular:  ?   Rate and Rhythm: Normal rate and regular rhythm.  ?   Heart sounds: Normal heart sounds. No murmur heard. ?Pulmonary:  ?   Effort: Pulmonary effort is normal.  ?   Breath sounds: Normal breath sounds.  ?   Comments: Lungs clear to auscultation ?Skin: ?   General: Skin is warm and dry.  ?Neurological:  ?   Mental Status: She is alert and oriented to person, place, and time.  ?Psychiatric:     ?   Mood and Affect: Mood normal.     ?   Behavior: Behavior normal.     ?   Thought Content: Thought content normal.     ?   Judgment: Judgment normal.   ? ? ?Diagnostics: ?FVC 1.18, FEV1 0.84.  Predicted FVC 2.70, predicted FEV1 2.05.  Spirometry indicates restriction.  This is consistent with previous spirometry readings. ? ?Assessment and Plan: ?1. Not well controlled severe persistent asthma   ?2. Mixed rhinitis   ?3. Gastroesophageal reflux disease, unspecified whether esophagitis present   ?4. Congestive heart failure, unspecified HF chronicity, unspecified heart failure type (HCC)   ?5. OSA (obstructive sleep apnea)   ? ? ?Patient Instructions  ?Asthma ?Continue Wixela 250-1 puff twice a day to prevent cough or wheeze ?Continue Spiriva 1.25 mcg 2 puffs once a day to prevent cough and wheeze ?Continue albuterol 2 puffs once every 4 hours as needed for cough or wheeze ?You may use albuterol 2 puffs 5 to 15 minutes before activity to decrease cough or wheeze ?Continue to receive Fasenra injections once every 8 weeks to prevent asthma symptoms ? ?Allergic rhinitis ?Your skin testing to environmental allergens was positive to molds.   ?Continue allergen avoidance measures directed toward mold as listed below ?Continue Atrovent nasal spray 2 sprays in each nostril twice a day as needed for runny nose ?Continue azelastine nasal spray 2 sprays in each nostril twice a day as needed for runny nose ?Consider saline nasal rinses as needed for nasal symptoms. Use this before any medicated nasal sprays for best result ? ?Reflux ?Continue dietary and lifestyle modifications as listed below ? ?Obstructive sleep apnea ?Keep your appointment with your pulmonary specialist for evaluation and treatment of sleep apnea ? ?Call the clinic if this treatment plan is not working well for you. ? ?Follow up in 4 months or sooner if needed. ? ? ?Return in about 4 months (around 07/01/2021), or if symptoms worsen or fail to improve. ?  ? ?Thank you for the opportunity to care for this patient.  Please do not hesitate to contact me with questions. ? ?Thermon Leyland, FNP ?[facility] ? ? ? ? ? ?

## 2021-03-03 ENCOUNTER — Other Ambulatory Visit: Payer: Self-pay | Admitting: Family Medicine

## 2021-03-22 ENCOUNTER — Other Ambulatory Visit: Payer: Self-pay | Admitting: Family Medicine

## 2021-03-29 ENCOUNTER — Ambulatory Visit: Payer: Medicare Other

## 2021-03-31 ENCOUNTER — Ambulatory Visit: Payer: Medicare Other

## 2021-04-05 ENCOUNTER — Ambulatory Visit (INDEPENDENT_AMBULATORY_CARE_PROVIDER_SITE_OTHER): Payer: Medicare Other

## 2021-04-05 DIAGNOSIS — J4550 Severe persistent asthma, uncomplicated: Secondary | ICD-10-CM

## 2021-04-05 DIAGNOSIS — J4551 Severe persistent asthma with (acute) exacerbation: Secondary | ICD-10-CM

## 2021-04-10 ENCOUNTER — Other Ambulatory Visit: Payer: Self-pay | Admitting: Family Medicine

## 2021-04-21 NOTE — Addendum Note (Signed)
Addended by: Carin Hock on: 04/21/2021 09:41 AM ? ? Modules accepted: Orders ? ?

## 2021-05-31 ENCOUNTER — Ambulatory Visit (INDEPENDENT_AMBULATORY_CARE_PROVIDER_SITE_OTHER): Payer: Medicare Other

## 2021-05-31 DIAGNOSIS — J4551 Severe persistent asthma with (acute) exacerbation: Secondary | ICD-10-CM | POA: Diagnosis not present

## 2021-07-08 ENCOUNTER — Other Ambulatory Visit: Payer: Self-pay | Admitting: Family Medicine

## 2021-07-27 ENCOUNTER — Ambulatory Visit: Payer: Medicare Other

## 2021-08-01 ENCOUNTER — Ambulatory Visit (INDEPENDENT_AMBULATORY_CARE_PROVIDER_SITE_OTHER): Payer: Medicare Other

## 2021-08-01 DIAGNOSIS — J4551 Severe persistent asthma with (acute) exacerbation: Secondary | ICD-10-CM

## 2021-09-26 ENCOUNTER — Ambulatory Visit (INDEPENDENT_AMBULATORY_CARE_PROVIDER_SITE_OTHER): Payer: Medicare Other

## 2021-09-26 DIAGNOSIS — J4550 Severe persistent asthma, uncomplicated: Secondary | ICD-10-CM | POA: Diagnosis not present

## 2021-09-26 DIAGNOSIS — J4551 Severe persistent asthma with (acute) exacerbation: Secondary | ICD-10-CM

## 2021-11-11 ENCOUNTER — Other Ambulatory Visit: Payer: Self-pay | Admitting: Family Medicine

## 2021-11-21 ENCOUNTER — Ambulatory Visit: Payer: Medicare Other

## 2021-11-30 ENCOUNTER — Other Ambulatory Visit: Payer: Self-pay

## 2021-11-30 ENCOUNTER — Ambulatory Visit (INDEPENDENT_AMBULATORY_CARE_PROVIDER_SITE_OTHER): Payer: Medicare Other | Admitting: Family Medicine

## 2021-11-30 ENCOUNTER — Encounter: Payer: Self-pay | Admitting: Family Medicine

## 2021-11-30 VITALS — BP 132/68 | HR 76 | Temp 97.7°F | Resp 20 | Ht 65.0 in | Wt 275.0 lb

## 2021-11-30 DIAGNOSIS — R0602 Shortness of breath: Secondary | ICD-10-CM | POA: Insufficient documentation

## 2021-11-30 DIAGNOSIS — J4550 Severe persistent asthma, uncomplicated: Secondary | ICD-10-CM | POA: Diagnosis not present

## 2021-11-30 DIAGNOSIS — G4733 Obstructive sleep apnea (adult) (pediatric): Secondary | ICD-10-CM

## 2021-11-30 DIAGNOSIS — K219 Gastro-esophageal reflux disease without esophagitis: Secondary | ICD-10-CM

## 2021-11-30 DIAGNOSIS — I509 Heart failure, unspecified: Secondary | ICD-10-CM

## 2021-11-30 DIAGNOSIS — J310 Chronic rhinitis: Secondary | ICD-10-CM

## 2021-11-30 NOTE — Progress Notes (Signed)
[address] [place] [address] Dept: [phone]  FOLLOW UP NOTE  Patient ID: Elaine Lee, female    DOB: Feb 17, 1945  Age: 76 y.o. MRN: [id number] Date of Office Visit: 11/30/2021  Assessment  Chief Complaint: Follow-up  HPI Elaine Lee is a 76 year old female who presents the clinic for an evaluation of acute cough.  She was last seen in this clinic on 03/10/2021 by Thermon Leyland, FNP, for evaluation of asthma, allergic rhinitis, reflux, and obstructive sleep apnea.  Her current problem list also includes CHF, A-fib, type 2 diabetes, and chronic kidney disease.  In the interim, she has visited her primary care provider 2 times over the last 4 to 6 weeks with symptoms including wheeze and urinary tract infection with hematuria for which she received prednisone Omnicef on 10/20/2021 and Levaquin on 11/20/2021.  She is currently waiting for consult with urology.  At today's visit, she reports that about 6 weeks ago she began to experience symptoms including intermittent wheezing, occasional shortness of breath which is worse with activity, and cough producing light yellow sputum.  She reports that these symptoms worsened about 3 weeks ago.  She reports that despite antibiotic and prednisone use, the symptoms have not improved.  She continues Wixela 250-1 puff twice a day, Spiriva 1.25 mcg 2 puffs once a day, and has used albuterol via nebulizer twice a day over the last 3 weeks with moderate relief of symptoms.  She continues Fasenra injections once every 8 weeks, however, she is 1 week late on her current dose due to illness.  She reports a significant decrease in her symptoms of asthma while continuing on Fasenra injections.  She continues to sleep in her recliner as she reports it is easier to breathe when sleeping at a moderate incline.  She denies hemoptysis.  She reports that she takes Lasix daily.  She reports that about 1 week ago she noted a 10 pound weight gain over several  days and took a PRN Lasix dose after which her breathing improved significantly.  Allergic rhinitis is reported as moderately well controlled with symptoms including clear rhinorrhea, nasal congestion, occasional sneeze, and copious postnasal drainage with frequent throat clearing.  She continues ipratropium and azelastine nasal sprays as needed.  Her last environmental allergy skin testing was on 11/06/2018 and was positive to mold mix 4.  She does have 1 cat in the home.  Reflux is reported as moderately well controlled with occasional heartburn occurring mostly at night for which she continues famotidine 20 mg twice a day.  She is not currently using her CPAP machine as she reports that she is having trouble keeping her mask on as well as sleeping in her recliner with her CPAP machine remaining in the bedroom.  Her current medications are listed in the chart.   Drug Allergies:  Allergies  Allergen Reactions   Other Shortness Of Breath and Other (See Comments)    ROSEMARY AND HEAVY SCENTED PERFUMES, DETERGENTS, ETC   Tape Itching, Rash and Other (See Comments)    Burns and blisters USE PAPER TAPE   Trazodone And Nefazodone Shortness Of Breath and Swelling   Celebrex [Celecoxib] Other (See Comments)    HARD TIME HEARING, LIKE BEING UNDERWATER   Latex Hives, Itching and Rash   Mysoline [Primidone] Other (See Comments)    unknown   Niacin And Related Hives and Swelling   Sulfa Antibiotics Other (See Comments)    TONGUE BLISTERS  Ciprofloxacin Nausea Only   Gramineae Pollens     Other reaction(s): Other (See Comments) Sneeze and cough   Aspirin Other (See Comments)    GI PROBLEMS AND PAIN    Physical Exam: BP 132/68 (BP Location: Left Arm, Patient Position: Sitting, Cuff Size: Normal)   Pulse 76   Temp 97.7 F (36.5 C) (Temporal)   Resp 20   Ht 5\' 5"  (1.651 m)   Wt 275 lb (124.7 kg)   SpO2 93%   BMI 45.76 kg/m    Physical Exam Vitals reviewed.  Constitutional:       Appearance: Normal appearance.  HENT:     Head: Normocephalic and atraumatic.     Right Ear: Tympanic membrane normal.     Left Ear: Tympanic membrane normal.     Nose:     Comments: Bilateral nares slightly erythematous with clear nasal drainage noted.  Pharynx slightly erythematous with no exudate.  Ears normal.  Eyes normal. Eyes:     Conjunctiva/sclera: Conjunctivae normal.  Cardiovascular:     Rate and Rhythm: Normal rate and regular rhythm.     Heart sounds: Normal heart sounds. No murmur heard. Pulmonary:     Effort: Pulmonary effort is normal.     Comments: Scattered expiratory wheeze which did not change postbronchodilator therapy. Musculoskeletal:        General: Normal range of motion.     Cervical back: Normal range of motion and neck supple.  Skin:    General: Skin is warm and dry.  Neurological:     Mental Status: She is alert and oriented to person, place, and time.  Psychiatric:        Mood and Affect: Mood normal.        Behavior: Behavior normal.        Thought Content: Thought content normal.        Judgment: Judgment normal.     Diagnostics: FVC 1.21, FEV1 0.80. Predicted FVC 2.68, predicted FEV1 2.03. Spirometry indicates possible severe restriction.  Bronchodilator therapy provided.  No postbronchodilator spirometry performed   Assessment and Plan: 1. Shortness of breath   2. Not well controlled severe persistent asthma   3. Congestive heart failure, unspecified HF chronicity, unspecified heart failure type (HCC)   4. Mixed rhinitis   5. OSA (obstructive sleep apnea)   6. Gastroesophageal reflux disease, unspecified whether esophagitis present     No orders of the defined types were placed in this encounter.   Patient Instructions  Asthma Continue montelukast 10 mg once a day to prevent cough or wheeze Continue Wixela 250-1 puff twice a day to prevent cough or wheeze Continue Spiriva 1.25 mcg 2 puffs once a day to prevent cough and  wheeze Continue albuterol 2 puffs once every 4 hours as needed for cough or wheeze You may use albuterol 2 puffs 5 to 15 minutes before activity to decrease cough or wheeze Continue to receive Fasenra injections once every 8 weeks to prevent asthma symptoms. Return on Monday for your Fasenra injection  Shortness of breath/fluid overload We have ordered a lab to help Saturday evaluate your fluid level which can affect your breathing Take the PRN dose of Lasix in addition to the daily dose for the next 2-3 days Get a daily weight at the same time of the day each day. Call your primary care provider if you have a 3-5 pound weight gain  Allergic rhinitis Continue allergen avoidance measures to mold as listed below Continue allergen avoidance measures  directed toward mold as listed below Continue Atrovent nasal spray 2 sprays in each nostril twice a day as needed for runny nose Continue azelastine nasal spray 2 sprays in each nostril twice a day as needed for runny nose Consider saline nasal rinses as needed for nasal symptoms. Use this before any medicated nasal sprays for best result  Reflux Continue dietary and lifestyle modifications as listed below Continue famotidine 20 mg twice a day  Obstructive sleep apnea Keep your appointment with your pulmonary specialist for evaluation and treatment of sleep apnea Move your CPAP machine out to your chair where you are sleeping and begin using it  Call the clinic if this treatment plan is not working well for you.  Follow up in 2 weeks or sooner if needed.   Return in about 2 weeks (around 12/14/2021), or if symptoms worsen or fail to improve.    Thank you for the opportunity to care for this patient.  Please do not hesitate to contact me with questions.  Thermon Leyland, FNP [facility]

## 2021-11-30 NOTE — Patient Instructions (Addendum)
Asthma Continue montelukast 10 mg once a day to prevent cough or wheeze Continue Wixela 250-1 puff twice a day to prevent cough or wheeze Continue Spiriva 1.25 mcg 2 puffs once a day to prevent cough and wheeze Continue albuterol 2 puffs once every 4 hours as needed for cough or wheeze You may use albuterol 2 puffs 5 to 15 minutes before activity to decrease cough or wheeze Continue to receive Fasenra injections once every 8 weeks to prevent asthma symptoms. Return on Monday for your Fasenra injection  Shortness of breath/fluid overload We have ordered a lab to help [place] evaluate your fluid level which can affect your breathing Take the PRN dose of Lasix in addition to the daily dose for the next 2-3 days Get a daily weight at the same time of the day each day. Call your primary care provider if you have a 3-5 pound weight gain  Allergic rhinitis Continue allergen avoidance measures to mold as listed below Continue allergen avoidance measures directed toward mold as listed below Continue Atrovent nasal spray 2 sprays in each nostril twice a day as needed for runny nose Continue azelastine nasal spray 2 sprays in each nostril twice a day as needed for runny nose Consider saline nasal rinses as needed for nasal symptoms. Use this before any medicated nasal sprays for best result  Reflux Continue dietary and lifestyle modifications as listed below Continue famotidine 20 mg twice a day  Obstructive sleep apnea Keep your appointment with your pulmonary specialist for evaluation and treatment of sleep apnea Move your CPAP machine out to your chair where you are sleeping and begin using it  Call the clinic if this treatment plan is not working well for you.  Follow up in 2 weeks or sooner if needed.  Control of Mold Allergen Mold and fungi can grow on a variety of surfaces provided certain temperature and moisture conditions exist.  Outdoor molds grow on plants, decaying vegetation and soil.   The major outdoor mold, Alternaria and Cladosporium, are found in very high numbers during hot and dry conditions.  Generally, a late Summer - Fall peak is seen for common outdoor fungal spores.  Rain will temporarily lower outdoor mold spore count, but counts rise rapidly when the rainy period ends.  The most important indoor molds are Aspergillus and Penicillium.  Dark, humid and poorly ventilated basements are ideal sites for mold growth.  The next most common sites of mold growth are the bathroom and the kitchen.  Outdoor [employer] Use air conditioning and keep windows closed Avoid exposure to decaying vegetation. Avoid leaf raking. Avoid grain handling. Consider wearing a face mask if working in moldy areas.  Indoor Mold Control Maintain humidity below 50%. Clean washable surfaces with 5% bleach solution. Remove sources e.g. Contaminated carpets.   Lifestyle Changes for Controlling GERD When you have GERD, stomach acid feels as if it's backing up toward your mouth. Whether or not you take medication to control your GERD, your symptoms can often be improved with lifestyle changes.   Raise Your Head Reflux is more likely to strike when you're lying down flat, because stomach fluid can flow backward more easily. Raising the head of your bed 4-6 inches can help. To do this: Slide blocks or books under the legs at the head of your bed. Or, place a wedge under the mattress. Many foam stores can make a suitable wedge for you. The wedge should run from your waist to the top of your  head. Don't just prop your head on several pillows. This increases pressure on your stomach. It can make GERD worse.  Watch Your Eating Habits Certain foods may increase the acid in your stomach or relax the lower esophageal sphincter, making GERD more likely. It's best to avoid the following: Coffee, tea, and carbonated drinks (with and without caffeine) Fatty, fried, or spicy food Mint, chocolate,  onions, and tomatoes Any other foods that seem to irritate your stomach or cause you pain  Relieve the Pressure Eat smaller meals, even if you have to eat more often. Don't lie down right after you eat. Wait a few hours for your stomach to empty. Avoid tight belts and tight-fitting clothes. Lose excess weight.  Tobacco and Alcohol Avoid smoking tobacco and drinking alcohol. They can make GERD symptoms worse.

## 2021-12-01 LAB — BRAIN NATRIURETIC PEPTIDE: BNP: 50 pg/mL (ref 0.0–100.0)

## 2021-12-01 NOTE — Progress Notes (Signed)
Can you please let this patient know that her lab result was within normal limits. Please have her continue with the plan from her visit to the clinic yesterday and we will see her in the [facility] on Monday afternoon for her Fasenra injection. Please have her call the clinic with any worsening of her symptoms. Thank you

## 2021-12-02 ENCOUNTER — Other Ambulatory Visit: Payer: Self-pay | Admitting: Family Medicine

## 2021-12-04 ENCOUNTER — Ambulatory Visit (INDEPENDENT_AMBULATORY_CARE_PROVIDER_SITE_OTHER): Payer: Medicare Other

## 2021-12-04 DIAGNOSIS — J4550 Severe persistent asthma, uncomplicated: Secondary | ICD-10-CM

## 2021-12-05 NOTE — Telephone Encounter (Signed)
Refills for Wixela and EpiPen 0.3 mL sent to [facility] per patient's request.

## 2022-01-16 ENCOUNTER — Other Ambulatory Visit: Payer: Self-pay | Admitting: Allergy & Immunology

## 2022-01-29 ENCOUNTER — Ambulatory Visit: Payer: 59

## 2022-01-30 ENCOUNTER — Ambulatory Visit (INDEPENDENT_AMBULATORY_CARE_PROVIDER_SITE_OTHER): Payer: 59

## 2022-01-30 DIAGNOSIS — J4550 Severe persistent asthma, uncomplicated: Secondary | ICD-10-CM | POA: Diagnosis not present

## 2022-02-13 ENCOUNTER — Telehealth: Payer: Self-pay | Admitting: *Deleted

## 2022-02-13 NOTE — Progress Notes (Deleted)
FOLLOW UP Date of Service/Encounter:  02/13/22   Subjective:  Elaine Lee (DOB: [birth year]) is a 77 y.o. female PMHx of CHF, A-fib, type 2 diabetes, and chronic kidney disease who [facility] in re-evaluation of the following: *** History obtained from: chart review and {Persons; PED relatives w/patient:19415::"patient"}.  For Review, LV was on 11/30/21  with Dr.Shakoya Gilmore seen for acute visit for cough . Coughing with wheezing and sputum production.  Not improved on antibiotics and prednisone (prescribed for UTI). Using Wixela 250 1 puffs BID + Spiriva 1.25 mcg 2 puff daily; requiring albuterol multiple times daily.  Reports improvement while on Fasenra q8weeks.  Taking daily lasix. Breathing improves with lasix.  ast environmental allergy skin testing was on 11/06/2018 and was positive to mold mix 4 on IDs only. Has hx of hearburn on famotidine 20 mg BID.  OSA, not using CPAP.  FEV1 0.80 at that visit.  She was instructed to continue medications and take PRN lasix if indicated, follow-up with PCP for weight gain/fluid overload.    She has been referred to cardiology at her most recent complex care visit on 12/05/21.   Phone encounter 02/13/22: patient requesting nebulizer, stating no improvement from prior visit in November. Follow-up in person recommended.   This is my first encounter with this patient.   Today presents for follow-up. ***  Allergies as of 02/15/2022       Reactions   Other Shortness Of Breath, Other (See Comments)   ROSEMARY AND HEAVY SCENTED PERFUMES, DETERGENTS, ETC   Tape Itching, Rash, Other (See Comments)   Burns and blisters USE PAPER TAPE   Trazodone And Nefazodone Shortness Of Breath, Swelling   Celebrex [celecoxib] Other (See Comments)   HARD TIME HEARING, LIKE BEING UNDERWATER   Latex Hives, Itching, Rash   Mysoline [primidone] Other (See Comments)   unknown   Niacin And Related Hives, Swelling    Sulfa Antibiotics Other (See Comments)   TONGUE BLISTERS    Ciprofloxacin Nausea Only   Gramineae Pollens    Other reaction(s): Other (See Comments) Sneeze and cough   Aspirin Other (See Comments)   GI PROBLEMS AND PAIN        Medication List        Accurate as of February 13, 2022  4:51 PM. If you have any questions, ask your nurse or doctor.          acetaminophen 500 MG tablet Commonly known as: TYLENOL Take 500 mg by mouth every 6 (six) hours as needed.   albuterol 0.63 MG/3ML nebulizer solution Commonly known as: ACCUNEB Take 1 ampule by nebulization 3 (three) times daily as needed for wheezing or shortness of breath.   albuterol 108 (90 Base) MCG/ACT inhaler Commonly known as: VENTOLIN HFA INHALE 1-2 PUFFS BY MOUTH EVERY 6 HOURS AS NEEDED FOR WHEEZE OR SHORTNESS OF BREATH   amLODipine 10 MG tablet Commonly known as: NORVASC Take 10 mg by mouth daily.   atorvastatin 10 MG tablet Commonly known as: LIPITOR Take 10 mg by mouth at bedtime.   azelastine 0.1 % nasal spray Commonly known as: ASTELIN Place 2 sprays into both nostrils 2 (two) times daily. Use in each nostril as directed   Biotene Dry Mouth Gentle Liqd Use as directed in the mouth or throat. 1 cap full Q2 hr PRN for dry mouth   Bismuth Subsalicylate 99991111 MG Tabs Take by mouth.   budesonide 0.5 MG/2ML nebulizer solution Commonly known  as: PULMICORT Take 2 mLs (0.5 mg total) by nebulization in the morning and at bedtime.   diltiazem 240 MG 24 hr capsule Commonly known as: CARDIZEM CD Take 300 mg by mouth daily. For blood pressure   diltiazem 240 MG 24 hr capsule Commonly known as: TIAZAC Take by mouth.   EPINEPHrine 0.3 mg/0.3 mL Soaj injection Commonly known as: EpiPen 2-Pak Use as directed for severe allergic reactions   famotidine 20 MG tablet Commonly known as: PEPCID Take 20 mg by mouth 2 (two) times daily.   Fasenra 30 MG/ML Sosy Generic drug: Benralizumab INJECT 30MG  SUBCUTANEOUSLY EVERY 8 WEEKS   furosemide 40 MG tablet Commonly known as: LASIX Take 40 mg by mouth daily.   gabapentin 600 MG tablet Commonly known as: NEURONTIN Take 1 tablet (600 mg total) by mouth 2 (two) times daily. What changed:  how much to take when to take this additional instructions   guaiFENesin 600 MG 12 hr tablet Commonly known as: MUCINEX Take 600 mg by mouth 2 (two) times daily.   insulin glargine-yfgn 100 UNIT/ML Pen Commonly known as: SEMGLEE INJECT 34 UNITS UNDER SKIN DAILY   liraglutide 18 MG/3ML Sopn Commonly known as: VICTOZA Inject 1.8 mg into the skin daily. For blood sugar control   loratadine 10 MG tablet Commonly known as: CLARITIN Take 10 mg by mouth every morning.   losartan 100 MG tablet Commonly known as: COZAAR Take 100 mg by mouth daily.   melatonin 5 MG Tabs Take 5 mg by mouth at bedtime.   Menthol (Topical Analgesic) 4 % Gel Apply topically. Apply topically as needed to painful joints   mirabegron ER 25 MG Tb24 tablet Commonly known as: MYRBETRIQ Take 50 mg by mouth daily. For bladder control   montelukast 10 MG tablet Commonly known as: SINGULAIR Take 1 tablet (10 mg total) by mouth at bedtime.   nystatin powder Commonly known as: MYCOSTATIN/NYSTOP Apply 1 g topically 4 (four) times daily as needed (for skin folds).   pantoprazole 40 MG tablet Commonly known as: Protonix Take 1 tablet (40 mg total) by mouth daily.   polyethylene glycol 17 g packet Commonly known as: MIRALAX / GLYCOLAX Take 17 g by mouth daily. What changed:  when to take this reasons to take this   predniSONE 10 MG tablet Commonly known as: DELTASONE Take 1 tablet (10 mg total) by mouth daily with breakfast.   rivaroxaban 20 MG Tabs tablet Commonly known as: XARELTO Take 20 mg by mouth daily with supper.   rOPINIRole 0.5 MG tablet Commonly known as: REQUIP Take 1 mg by mouth at bedtime.   Spiriva Respimat 1.25 MCG/ACT Aers Generic drug:  Tiotropium Bromide Monohydrate Inhale 2 puffs into the lungs daily.   tetrahydrozoline 0.05 % ophthalmic solution Place 1 drop into both eyes 4 (four) times daily as needed (for eye irritation).   traMADol 50 MG tablet Commonly known as: ULTRAM Take 50 mg by mouth every 6 (six) hours as needed.   Vitamin D3 125 MCG (5000 UT) capsule Generic drug: Cholecalciferol Take 5,000 Units by mouth daily.   Wixela Inhub 250-50 MCG/ACT Aepb Generic drug: fluticasone-salmeterol INHALE 1 PUFF INTO THE LUNGS IN THE MORNING AND AT BEDTIME.       Past Medical History:  Diagnosis Date   A-fib ([facility])    Asthma    CHF (congestive heart failure) (HCC)    Chronic edema    CKD (chronic kidney disease), stage III (HCC)    COPD (chronic  obstructive pulmonary disease) (HCC)    Degenerative lumbar disc    Depression    Diabetes mellitus without complication (HCC)    Diabetic neuropathy (HCC)    GERD (gastroesophageal reflux disease)    Gout    Hearing loss    Hypercholesteremia    Hypertension    Insomnia    Obstructive sleep apnea on CPAP    Osteoarthritis    knees and shoulders   Renal disorder    Urinary frequency    Past Surgical History:  Procedure Laterality Date   ABDOMINAL HYSTERECTOMY     KNEE ARTHROPLASTY Right 12/05/2017   Procedure: COMPUTER ASSISTED TOTAL KNEE ARTHROPLASTY;  Surgeon: Rod Can, MD;  Location: [facility];  Service: Orthopedics;  Laterality: Right;   ROTATOR CUFF REPAIR  1980   x3   TONSILLECTOMY     Otherwise, there have been no changes to her past medical history, surgical history, family history, or social history.  ROS: All others negative except as noted per HPI.   Objective:  There were no vitals taken for this visit. There is no height or weight on file to calculate BMI. Physical Exam: General Appearance:  Alert, cooperative, no distress, appears stated age  Head:  Normocephalic, without obvious abnormality, atraumatic  Eyes:  Conjunctiva clear,  EOM's intact  Nose: Nares normal, {Blank multiple:19196:a:"***","hypertrophic turbinates","normal mucosa","no visible anterior polyps","septum midline"}  Throat: Lips, tongue normal; teeth and gums normal, {Blank multiple:19196:a:"***","normal posterior oropharynx","tonsils 2+","tonsils 3+","no tonsillar exudate","+ cobblestoning"}  Neck: Supple, symmetrical  Lungs:   {Blank multiple:19196:a:"***","clear to auscultation bilaterally","end-expiratory wheezing","wheezing throughout"}, Respirations unlabored, {Blank multiple:19196:a:"***","no coughing","intermittent dry coughing"}  Heart:  {Blank multiple:19196:a:"***","regular rate and rhythm","no murmur"}, Appears well perfused  Extremities: No edema  Skin: Skin color, texture, turgor normal, no rashes or lesions on visualized portions of skin  Neurologic: No gross deficits   Reviewed: ***  Spirometry:  Tracings reviewed. Her effort: {Blank single:19197::"Good reproducible efforts.","It was hard to get consistent efforts and there is a question as to whether this reflects a maximal maneuver.","Poor effort, data can not be interpreted.","Variable effort-results affected.","decent for first attempt at spirometry."} FVC: ***L FEV1: ***L, ***% predicted FEV1/FVC ratio: ***% Interpretation: {Blank single:19197::"Spirometry consistent with mild obstructive disease","Spirometry consistent with moderate obstructive disease","Spirometry consistent with severe obstructive disease","Spirometry consistent with possible restrictive disease","Spirometry consistent with mixed obstructive and restrictive disease","Spirometry uninterpretable due to technique","Spirometry consistent with normal pattern","No overt abnormalities noted given today's efforts"}.  Please see scanned spirometry results for details.  Skin Testing: {Blank single:19197::"Select foods","Environmental allergy panel","Environmental allergy panel and select foods","Food allergy  panel","None","Deferred due to recent antihistamines use","deferred due to recent reaction"}. ***Adequate positive and negative controls Results discussed with patient/family.   {Blank single:19197::"Allergy testing results were read and interpreted by myself, documented by clinical staff."," "}  Assessment/Plan   ***  Sigurd Sos, MD  [facility] [place]

## 2022-02-13 NOTE — Telephone Encounter (Signed)
Can you please have this patient make a follow up appointment with one of our physician colleagues? I am ok with her having a nebulizer. Can you please order Xopenex via nebulizer once every 6 hours as needed and Xopenex HFA inhaler once every 6 hours as needed. Please have her stop using albuterol as needed and have her begin Xopenex as needed instead due to her cardiac history. Thank you

## 2022-02-13 NOTE — Telephone Encounter (Signed)
Per Apolonio Schneiders:  Pt states she is still coughing up yellow mucus and she is not any better than she was three months ago. Pt also states she needs been told she needs a nebulizer wanted to get one if possible.   Anne please advise.

## 2022-02-14 MED ORDER — LEVALBUTEROL TARTRATE 45 MCG/ACT IN AERO: 2.0000 | INHALATION_SPRAY | RESPIRATORY_TRACT | 2 refills | Status: DC | PRN

## 2022-02-14 NOTE — Telephone Encounter (Signed)
Sent rx for xopenex HFA. Anne- which strength Xopenex should I send?   Tried calling pt- no answer- left voicemail for a return call.

## 2022-02-15 ENCOUNTER — Ambulatory Visit: Payer: 59 | Admitting: Internal Medicine

## 2022-02-15 MED ORDER — LEVALBUTEROL HCL 1.25 MG/3ML IN NEBU: 1.2500 mg | INHALATION_SOLUTION | RESPIRATORY_TRACT | 5 refills | Status: DC | PRN

## 2022-02-15 NOTE — Telephone Encounter (Signed)
Rx sent. Pt is scheduled today with Dr. Simona Huh.

## 2022-02-15 NOTE — Telephone Encounter (Signed)
Xopenex 1.25 via neb once every 6-8 hours as needed for cough or wheeze please. Thank you

## 2022-02-26 ENCOUNTER — Encounter: Payer: Self-pay | Admitting: Internal Medicine

## 2022-02-26 ENCOUNTER — Ambulatory Visit (INDEPENDENT_AMBULATORY_CARE_PROVIDER_SITE_OTHER): Payer: 59 | Admitting: Internal Medicine

## 2022-02-26 ENCOUNTER — Telehealth: Payer: Self-pay | Admitting: Family Medicine

## 2022-02-26 VITALS — BP 130/90 | HR 65 | Temp 98.1°F | Resp 18 | Wt 262.0 lb

## 2022-02-26 DIAGNOSIS — J310 Chronic rhinitis: Secondary | ICD-10-CM

## 2022-02-26 DIAGNOSIS — I509 Heart failure, unspecified: Secondary | ICD-10-CM

## 2022-02-26 DIAGNOSIS — G4733 Obstructive sleep apnea (adult) (pediatric): Secondary | ICD-10-CM

## 2022-02-26 DIAGNOSIS — R062 Wheezing: Secondary | ICD-10-CM

## 2022-02-26 DIAGNOSIS — J4550 Severe persistent asthma, uncomplicated: Secondary | ICD-10-CM | POA: Diagnosis not present

## 2022-02-26 DIAGNOSIS — K219 Gastro-esophageal reflux disease without esophagitis: Secondary | ICD-10-CM | POA: Diagnosis not present

## 2022-02-26 MED ORDER — IPRATROPIUM-ALBUTEROL 0.5-2.5 (3) MG/3ML IN SOLN: 3.0000 mL | Freq: Four times a day (QID) | RESPIRATORY_TRACT | 1 refills | Status: DC | PRN

## 2022-02-26 MED ORDER — AZITHROMYCIN 250 MG PO TABS: ORAL_TABLET | ORAL | 0 refills | Status: DC

## 2022-02-26 MED ORDER — IPRATROPIUM-ALBUTEROL 0.5-2.5 (3) MG/3ML IN SOLN: 3.0000 mL | Freq: Four times a day (QID) | RESPIRATORY_TRACT | 5 refills | Status: DC | PRN

## 2022-02-26 NOTE — Progress Notes (Unsigned)
New Patient Note  RE: Elaine Lee MRN: [id number] DOB: 10/31/1945 Date of Office Visit: 02/26/2022  Consult requested by: No ref. provider found Primary care provider: Janifer Adie, MD (Inactive)  Chief Complaint: No chief complaint on file.  History of Present Illness: I had the pleasure of seeing Elaine Lee for initial evaluation at the [facility] on 02/26/2022. She is a 77 y.o. female, who is referred here by Janifer Adie, MD (Inactive) for the evaluation of 11/30/21.  History obtained from patient, chart review.  Pertinent History/Diagnostics:  - Asthma: moderate-severe, comorbid GERD, OSA on CPAP  - *** spirometry (***): ratio ***, *** FEV1 (pre), + *** FEV1 (post)  - AEC (***) ***, Total IgE (***) ***   - Fasenra started  - Allergic Rhinitis:   - SPT environmental panel (11/06/2018): mold mix 4 - Food Allergy (***)  - Hx of reaction: ***  - SPT select foods (***): ***  - sIgE (***) *** - Atopic Dermatitis   - *** - Drug Allery ***  - Hx of Reaction: ***   Assessment and Plan: [name] is a 77 y.o. female with: No diagnosis found.  *** Plan: There are no Patient Instructions on file for this visit.  No orders of the defined types were placed in this encounter.  Lab Orders  No laboratory test(s) ordered today    Other allergy screening: Asthma: {Blank single:19197::"yes","no"} Rhino conjunctivitis: {Blank single:19197::"yes","no"} Food allergy: {Blank single:19197::"yes","no"} Medication allergy: {Blank single:19197::"yes","no"} Hymenoptera allergy: {Blank single:19197::"yes","no"} Urticaria: {Blank single:19197::"yes","no"} Eczema:{Blank single:19197::"yes","no"} History of recurrent infections suggestive of immunodeficency: {Blank single:19197::"yes","no"}  Diagnostics: Spirometry:  Tracings reviewed. Her effort: {Blank single:19197::"Good reproducible efforts.","It was hard to get consistent efforts and  there is a question as to whether this reflects a maximal maneuver.","Poor effort, data can not be interpreted."} FVC: ***L FEV1: ***L, ***% predicted FEV1/FVC ratio: ***% Interpretation: {Blank single:19197::"Spirometry consistent with mild obstructive disease","Spirometry consistent with moderate obstructive disease","Spirometry consistent with severe obstructive disease","Spirometry consistent with possible restrictive disease","Spirometry consistent with mixed obstructive and restrictive disease","Spirometry uninterpretable due to technique","Spirometry consistent with normal pattern","No overt abnormalities noted given today's efforts"}.  Please see scanned spirometry results for details.  Skin Testing: {Blank single:19197::"Select foods","Environmental allergy panel","Environmental allergy panel and select foods","Food allergy panel","None","Deferred due to recent antihistamines use"}.  Epicutaneous Testing: ***  Intradermal Testing: *** *** adequate controls  Results interpreted by myself and discussed with patient/family.   Past Medical History: Patient Active Problem List   Diagnosis Date Noted   Shortness of breath 11/30/2021   Mixed rhinitis 02/01/2021   Gastroesophageal reflux disease 01/22/2019   Not well controlled severe persistent asthma 11/06/2018   Allergic rhinitis with a predominantly nonallergic component 11/06/2018   Cough, persistent 11/06/2018   Osteoarthritis of right knee 12/05/2017   Infection due to parainfluenza virus 3 06/27/2017   Atrial fibrillation ([place]) 06/24/2017   UTI (urinary tract infection) 04/05/2016   Pleuritic chest pain 04/05/2016   AKI (acute kidney injury) ([place]) 04/05/2016   Tremor 04/05/2016   Fall    Severe persistent asthma with acute exacerbation 10/25/2014   OSA (obstructive sleep apnea) 10/25/2014   Morbid obesity ([place]) 10/25/2014   Acute on chronic respiratory failure with hypoxia (HCC)    DM2 (diabetes mellitus, type 2) ([place])  06/14/2014   CKD (chronic kidney disease), stage III ([place]) 06/14/2014   Neuropathic pain 06/14/2014   HTN (hypertension) 06/14/2014   Congestive heart failure ([place]) 06/14/2014   Type 2 diabetes with stage 3  chronic kidney disease GFR 30-59 (HCC)    COPD exacerbation ([place]) 06/13/2014   CHF (congestive heart failure) ([place]) 06/13/2014   Past Medical History:  Diagnosis Date   A-fib ([place])    Asthma    CHF (congestive heart failure) (HCC)    Chronic edema    CKD (chronic kidney disease), stage III (HCC)    COPD (chronic obstructive pulmonary disease) (HCC)    Degenerative lumbar disc    Depression    Diabetes mellitus without complication (HCC)    Diabetic neuropathy (HCC)    GERD (gastroesophageal reflux disease)    Gout    Hearing loss    Hypercholesteremia    Hypertension    Insomnia    Obstructive sleep apnea on CPAP    Osteoarthritis    knees and shoulders   Renal disorder    Urinary frequency    Past Surgical History: Past Surgical History:  Procedure Laterality Date   ABDOMINAL HYSTERECTOMY     KNEE ARTHROPLASTY Right 12/05/2017   Procedure: COMPUTER ASSISTED TOTAL KNEE ARTHROPLASTY;  Surgeon: Rod Can, MD;  Location: [facility];  Service: Orthopedics;  Laterality: Right;   ROTATOR Lee REPAIR  1980   x3   TONSILLECTOMY     Medication List:  Current Outpatient Medications  Medication Sig Dispense Refill   acetaminophen (TYLENOL) 500 MG tablet Take 500 mg by mouth every 6 (six) hours as needed.     albuterol (ACCUNEB) 0.63 MG/3ML nebulizer solution Take 1 ampule by nebulization 3 (three) times daily as needed for wheezing or shortness of breath.     albuterol (VENTOLIN HFA) 108 (90 Base) MCG/ACT inhaler INHALE 1-2 PUFFS BY MOUTH EVERY 6 HOURS AS NEEDED FOR WHEEZE OR SHORTNESS OF BREATH 18 g 1   amLODipine (NORVASC) 10 MG tablet Take 10 mg by mouth daily.     atorvastatin (LIPITOR) 10 MG tablet Take 10 mg by mouth at bedtime.      azelastine (ASTELIN) 0.1 % nasal  spray Place 2 sprays into both nostrils 2 (two) times daily. Use in each nostril as directed 30 mL 12   Bismuth Subsalicylate 99991111 MG TABS Take by mouth.     budesonide (PULMICORT) 0.5 MG/2ML nebulizer solution Take 2 mLs (0.5 mg total) by nebulization in the morning and at bedtime. 120 mL 5   Cholecalciferol (VITAMIN D3) 125 MCG (5000 UT) CAPS Take 5,000 Units by mouth daily.     diltiazem (CARDIZEM CD) 240 MG 24 hr capsule Take 300 mg by mouth daily. For blood pressure     diltiazem (TIAZAC) 240 MG 24 hr capsule Take by mouth.     EPINEPHrine (EPIPEN 2-PAK) 0.3 mg/0.3 mL IJ SOAJ injection Use as directed for severe allergic reactions 2 each 3   famotidine (PEPCID) 20 MG tablet Take 20 mg by mouth 2 (two) times daily.     FASENRA 30 MG/ML SOSY INJECT '30MG'$  SUBCUTANEOUSLY EVERY 8 WEEKS 1 mL 6   fluticasone-salmeterol (WIXELA INHUB) 250-50 MCG/ACT AEPB INHALE 1 PUFF INTO THE LUNGS IN THE MORNING AND AT BEDTIME. 60 each 4   furosemide (LASIX) 40 MG tablet Take 40 mg by mouth daily.      gabapentin (NEURONTIN) 600 MG tablet Take 1 tablet (600 mg total) by mouth 2 (two) times daily. (Patient taking differently: Take 600-1,200 mg by mouth See admin instructions. Take 600 mg by mouth in the morning and take 1200 mg by mouth at bedtime) 60 tablet 2   guaiFENesin (MUCINEX) 600 MG 12 hr  tablet Take 600 mg by mouth 2 (two) times daily.     Insulin Glargine-yfgn 100 UNIT/ML SOPN INJECT 34 UNITS UNDER SKIN DAILY     levalbuterol (XOPENEX HFA) 45 MCG/ACT inhaler Inhale 2 puffs into the lungs every 4 (four) hours as needed for wheezing. 15 g 2   levalbuterol (XOPENEX) 1.25 MG/3ML nebulizer solution Take 1.25 mg by nebulization every 4 (four) hours as needed for wheezing. 72 mL 5   liraglutide (VICTOZA) 18 MG/3ML SOPN Inject 1.8 mg into the skin daily. For blood sugar control     loratadine (CLARITIN) 10 MG tablet Take 10 mg by mouth every morning.     losartan (COZAAR) 100 MG tablet Take 100 mg by mouth daily.      melatonin 5 MG TABS Take 5 mg by mouth at bedtime.     Menthol, Topical Analgesic, 4 % GEL Apply topically. Apply topically as needed to painful joints     mirabegron ER (MYRBETRIQ) 25 MG TB24 tablet Take 50 mg by mouth daily. For bladder control     montelukast (SINGULAIR) 10 MG tablet Take 1 tablet (10 mg total) by mouth at bedtime. 30 tablet 5   Mouthwashes (BIOTENE DRY MOUTH GENTLE) LIQD Use as directed in the mouth or throat. 1 cap full Q2 hr PRN for dry mouth     nystatin (MYCOSTATIN/NYSTOP) powder Apply 1 g topically 4 (four) times daily as needed (for skin folds).      pantoprazole (PROTONIX) 40 MG tablet Take 1 tablet (40 mg total) by mouth daily. 30 tablet 11   polyethylene glycol (MIRALAX / GLYCOLAX) packet Take 17 g by mouth daily. (Patient taking differently: Take 17 g by mouth daily as needed for moderate constipation.) 14 each 0   predniSONE (DELTASONE) 10 MG tablet Take 1 tablet (10 mg total) by mouth daily with breakfast. 30 tablet 1   rivaroxaban (XARELTO) 20 MG TABS tablet Take 20 mg by mouth daily with supper.     rOPINIRole (REQUIP) 0.5 MG tablet Take 1 mg by mouth at bedtime.     tetrahydrozoline 0.05 % ophthalmic solution Place 1 drop into both eyes 4 (four) times daily as needed (for eye irritation).      Tiotropium Bromide Monohydrate (SPIRIVA RESPIMAT) 1.25 MCG/ACT AERS Inhale 2 puffs into the lungs daily. 1 each 3   traMADol (ULTRAM) 50 MG tablet Take 50 mg by mouth every 6 (six) hours as needed. (Patient not taking: Reported on 03/07/2020)     Current Facility-Administered Medications  Medication Dose Route Frequency Provider Last Rate Last Admin   Benralizumab SOSY 30 mg  30 mg Subcutaneous Q8 Toney Reil, MD   30 mg at 01/30/22 1507   Allergies: Allergies  Allergen Reactions   Other Shortness Of Breath and Other (See Comments)    ROSEMARY AND HEAVY SCENTED PERFUMES, DETERGENTS, ETC   Tape Itching, Rash and Other (See Comments)    Burns and  blisters USE PAPER TAPE   Trazodone And Nefazodone Shortness Of Breath and Swelling   Celebrex [Celecoxib] Other (See Comments)    HARD TIME HEARING, LIKE BEING UNDERWATER   Latex Hives, Itching and Rash   Mysoline [Primidone] Other (See Comments)    unknown   Niacin And Related Hives and Swelling   Sulfa Antibiotics Other (See Comments)    TONGUE BLISTERS     Ciprofloxacin Nausea Only   Gramineae Pollens     Other reaction(s): Other (See Comments) Sneeze and cough   Aspirin  Other (See Comments)    GI PROBLEMS AND PAIN   Social History: Social History   Socioeconomic History   Marital status: Widowed    Spouse name: Not on file   Number of children: Not on file   Years of education: Not on file   Highest education level: Not on file  Occupational History   Not on file  Tobacco Use   Smoking status: Never   Smokeless tobacco: Never  Vaping Use   Vaping Use: Never used  Substance and Sexual Activity   Alcohol use: No    Alcohol/week: 0.0 standard drinks of alcohol   Drug use: Never   Sexual activity: Not on file  Other Topics Concern   Not on file  Social History Narrative   Not on file   Social Determinants of Health   Financial Resource Strain: Not on file  Food Insecurity: Not on file  Transportation Needs: Not on file  Physical Activity: Not on file  Stress: Not on file  Social Connections: Not on file   Lives in a ***. Smoking: *** Occupation: ***  Environmental HistoryFreight forwarder in the house: Estate agent in the family room: {Blank single:19197::"yes","no"} Carpet in the bedroom: {Blank single:19197::"yes","no"} Heating: {Blank single:19197::"electric","gas","heat pump"} Cooling: {Blank single:19197::"central","window","heat pump"} Pet: {Blank single:19197::"yes ***","no"}  Family History: Family History  Problem Relation Age of Onset   Allergies Mother    Rheumatologic disease Mother    Cancer Mother         leukemia   Allergies Father    Asthma Father    Heart disease Father    Rheumatologic disease Father    Cancer Father        leukemia   Allergies Daughter    Cancer Sister    Breast cancer Maternal Aunt    Breast cancer Paternal Aunt    Breast cancer Maternal Aunt    Allergic rhinitis Neg Hx    Eczema Neg Hx    Urticaria Neg Hx      ROS: All others negative except as noted per HPI.   Objective: There were no vitals taken for this visit. There is no height or weight on file to calculate BMI.  General Appearance:  Alert, cooperative, no distress, appears stated age  Head:  Normocephalic, without obvious abnormality, atraumatic  Eyes:  Conjunctiva clear, EOM's intact  Nose: Nares normal, {Blank multiple:19196:a:"***","hypertrophic turbinates","normal mucosa","no visible anterior polyps","septum midline"}  Throat: Lips, tongue normal; teeth and gums normal, {Blank multiple:19196:a:"***","normal posterior oropharynx","tonsils 2+","tonsils 3+","no tonsillar exudate","+ cobblestoning"}  Neck: Supple, symmetrical  Lungs:   {Blank multiple:19196:a:"***","clear to auscultation bilaterally","end-expiratory wheezing","wheezing throughout"}, Respirations unlabored, {Blank multiple:19196:a:"***","no coughing","intermittent dry coughing"}  Heart:  {Blank multiple:19196:a:"***","regular rate and rhythm","no murmur"}, Appears well perfused  Extremities: No edema  Skin: Skin color, texture, turgor normal, {Blank multiple:19196:a:"***","no rashes or lesions on visualized portions of skin"}  Neurologic: No gross deficits   The plan was reviewed with the patient/family, and all questions/concerned were addressed.  It was my pleasure to see Fatma today and participate in her care. Please feel free to contact me with any questions or concerns.  Sincerely,  Roney Marion, MD Allergy & Immunology  [facility] office: [phone] [facility]  office: [phone]

## 2022-02-26 NOTE — Patient Instructions (Addendum)
Wheezing  -Will get chest x-ray to further evaluate for pulmonary edema, infectious causes -DuoNeb given in clinic and wheezing slightly improved however cannot rule out fluid overload -BMP and BNP ordered today -Will hold off on steroids until chest x-ray and labs result -For now start DuoNebs every 6 hours -Start azithromycin 2 tabs on day 1 followed by 1 tab daily for 4 days  -May add prednisone and/or additional antibiotics depending on workup above  -Continue current control plan below for asthma, allergic rhinitis, GERD   Asthma Continue montelukast 10 mg once a day to prevent cough or wheeze Continue Wixela 250-1 puff twice a day to prevent cough or wheeze Continue Spiriva 1.25 mcg 2 puffs once a day to prevent cough and wheeze Continue albuterol 2 puffs once every 4 hours as needed for cough or wheeze You may use albuterol 2 puffs 5 to 15 minutes before activity to decrease cough or wheeze Continue to receive Fasenra injections once every 8 weeks to prevent asthma symptoms. Return on Monday for your Fasenra injection   Allergic rhinitis Continue allergen avoidance measures to mold as listed below Continue allergen avoidance measures directed toward mold as listed below Continue Atrovent nasal spray 2 sprays in each nostril twice a day as needed for runny nose Continue azelastine nasal spray 2 sprays in each nostril twice a day as needed for runny nose Consider saline nasal rinses as needed for nasal symptoms. Use this before any medicated nasal sprays for best result   Reflux Continue dietary and lifestyle modifications as listed below Continue famotidine 20 mg twice a day   Obstructive sleep apnea Continue to follow with pulmonary and use CPAP as prescribed   Follow up: we will contact you with results and next steps   Thank you so much for letting me partake in your care today.  Don't hesitate to reach out if you have any additional concerns!  Roney Marion, MD   [facility] [place]

## 2022-02-27 ENCOUNTER — Telehealth: Payer: Self-pay

## 2022-02-27 LAB — BASIC METABOLIC PANEL
BUN/Creatinine Ratio: 15 (ref 12–28)
BUN: 14 mg/dL (ref 8–27)
CO2: 24 mmol/L (ref 20–29)
Calcium: 9.1 mg/dL (ref 8.7–10.3)
Chloride: 103 mmol/L (ref 96–106)
Creatinine, Ser: 0.92 mg/dL (ref 0.57–1.00)
Glucose: 128 mg/dL — ABNORMAL HIGH (ref 70–99)
Potassium: 4.3 mmol/L (ref 3.5–5.2)
Sodium: 142 mmol/L (ref 134–144)
eGFR: 64 mL/min/{1.73_m2} (ref >59)

## 2022-02-27 LAB — BRAIN NATRIURETIC PEPTIDE: BNP: 83.2 pg/mL (ref 0.0–100.0)

## 2022-02-27 MED ORDER — IPRATROPIUM-ALBUTEROL 0.5-2.5 (3) MG/3ML IN SOLN: 3.0000 mL | Freq: Once | RESPIRATORY_TRACT | Status: AC

## 2022-02-27 NOTE — Progress Notes (Signed)
Follow Up Note  RE: Elaine Lee MRN: [id number] DOB: 1945/02/15 Date of Office Visit: 02/26/2022  Referring provider: No ref. provider found Primary care provider: Janifer Adie, MD (Inactive)  Chief Complaint: Asthma  History of Present Illness: I had the pleasure of seeing Elaine Lee for a follow up visit at the [facility] [place] [facility] [place] on 02/27/2022. She is a 77 y.o. Lee, who is being followed for severe persistent asthma on Fasenra, mixed rhinitis, GERD, OSA. Her previous allergy office visit was on 11/30/21 with Gareth Morgan, [place]. Today is a  acute visit  .  History obtained from patient, chart review.  History was very difficult to obtain from patient.  She initially thought she was presenting to her primary care physician and then realized it was her allergist.  As best as I can acertain, she reports a few weeks of progressive wheezing, shortness of breath, cough.  She is sleeping in recliner due to orthopnea.  She has had some lower extremity edema which she attributes to 1 leg component of her recliner being broken.  She has been using her albuterol nebs every 4 hours.  She is supposed to take a dose of Lasix if she gains 2 pounds in a day.  She cannot tell me what her dry weight was.  Last weight we have in the chart was 275 pounds however she reports having to lose weight for an upcoming knee replacement.  Denies any fevers.  Denies any chest pain although was having chest discomfort with ambulation.  She reports having normal low oxygen saturations and previously was on oxygen for a time.  She cannot give any more details.  Initially oxygen sats were 88% however improved to 93% with rest.  Pertinent History/Diagnostics:  - Asthma: moderate-severe, comorbid GERD, OSA on CPAP  - on wixela 229mg, spiriva 1.219m, montelulast   - severe restriction/obstruction spirometry (11/30/21): ratio 66, 0.80 L, 39% FEV1 (pre),   - Fasenra started 03/02/19 - Allergic  Rhinitis:   - SPT environmental panel (11/06/2018): mold mix 4  Assessment and Plan: Elaine Lee a [age].o. Lee with: Wheezing - Plan: B Nat Peptide, Basic Metabolic Panel (BMET), DG Chest 2 View  Not well controlled severe persistent asthma  Congestive heart failure, unspecified HF chronicity, unspecified heart failure type (HCC)  Mixed rhinitis  Gastroesophageal reflux disease, unspecified whether esophagitis present  OSA (obstructive sleep apnea)  Patient has multiple comorbidities which could be contributing to her current picture.  Is concerning she was slightly confused on arrival.  History difficult to obtain in order to differentiate volume overload versus bronchospasm.  She had mild response for stability and wheezing with DuoNebs but not significant.  Plan to get a chest x-ray and BMP/BNP.  Start azithromycin today.  If concerning for heart failure will direct patient to ER for further workup.  If asthma will call in prednisone and potentially further antibiotics pending on chest x-ray.  Return precautions given for patient to go to the ER tonight if symptoms worsen. Plan: Patient Instructions   Wheezing  -Will get chest x-ray to further evaluate for pulmonary edema, infectious causes -DuoNeb given in clinic and wheezing slightly improved however cannot rule out fluid overload -BMP and BNP ordered today -Will hold off on steroids until chest x-ray and labs result -For now start DuoNebs every 6 hours -Start azithromycin 2 tabs on day 1 followed by 1 tab daily for 4 days  -May add prednisone and/or additional antibiotics depending  on workup above  -Continue current control plan below for asthma, allergic rhinitis, GERD   Asthma Continue montelukast 10 mg once a day to prevent cough or wheeze Continue Wixela 250-1 puff twice a day to prevent cough or wheeze Continue Spiriva 1.25 mcg 2 puffs once a day to prevent cough and wheeze Continue albuterol 2 puffs once every 4 hours  as needed for cough or wheeze You may use albuterol 2 puffs 5 to 15 minutes before activity to decrease cough or wheeze Continue to receive Fasenra injections once every 8 weeks to prevent asthma symptoms. Return on Monday for your Fasenra injection   Allergic rhinitis Continue allergen avoidance measures to mold as listed below Continue allergen avoidance measures directed toward mold as listed below Continue Atrovent nasal spray 2 sprays in each nostril twice a day as needed for runny nose Continue azelastine nasal spray 2 sprays in each nostril twice a day as needed for runny nose Consider saline nasal rinses as needed for nasal symptoms. Use this before any medicated nasal sprays for best result   Reflux Continue dietary and lifestyle modifications as listed below Continue famotidine 20 mg twice a day   Obstructive sleep apnea Continue to follow with pulmonary and use CPAP as prescribed   Follow up: we will contact you with results and next steps   Thank you so much for letting me partake in your care today.  Don't hesitate to reach out if you have any additional concerns!  Roney Marion, MD  [facility] [place] [place]    Meds ordered this encounter  Medications   DISCONTD: azithromycin (ZITHROMAX) 250 MG tablet    Sig: Take 2 tabs by mouth on day 1, then take 1 tab by mouth for the next 4 days    Dispense:  6 each    Refill:  0   DISCONTD: ipratropium-albuterol (DUONEB) 0.5-2.5 (3) MG/3ML SOLN    Sig: Take 3 mLs by nebulization every 6 (six) hours as needed.    Dispense:  3 mL    Refill:  5   azithromycin (ZITHROMAX) 250 MG tablet    Sig: Take 2 tabs by mouth on day 1, then take 1 tab by mouth for the next 4 days, Normal    Dispense:  6 each    Refill:  0   ipratropium-albuterol (DUONEB) 0.5-2.5 (3) MG/3ML SOLN    Sig: Take 3 mLs by nebulization every 6 (six) hours as needed.    Dispense:  75 mL    Refill:  1   ipratropium-albuterol (DUONEB) 0.5-2.5  (3) MG/3ML nebulizer solution 3 mL    Lab Orders         B Nat Peptide         Basic Metabolic Panel (BMET)    Diagnostics: None done   Medication List:  Current Outpatient Medications  Medication Sig Dispense Refill   acetaminophen (TYLENOL) 500 MG tablet Take 500 mg by mouth every 6 (six) hours as needed.     albuterol (VENTOLIN HFA) 108 (90 Base) MCG/ACT inhaler INHALE 1-2 PUFFS BY MOUTH EVERY 6 HOURS AS NEEDED FOR WHEEZE OR SHORTNESS OF BREATH 18 g 1   atorvastatin (LIPITOR) 10 MG tablet Take 10 mg by mouth at bedtime.      azithromycin (ZITHROMAX) 250 MG tablet Take 2 tabs by mouth on day 1, then take 1 tab by mouth for the next 4 days, Normal 6 each 0   budesonide (PULMICORT) 0.5 MG/2ML nebulizer solution  Take 2 mLs (0.5 mg total) by nebulization in the morning and at bedtime. 120 mL 5   EPINEPHrine (EPIPEN 2-PAK) 0.3 mg/0.3 mL IJ SOAJ injection Use as directed for severe allergic reactions 2 each 3   famotidine (PEPCID) 20 MG tablet Take 20 mg by mouth 2 (two) times daily.     FASENRA 30 MG/ML SOSY INJECT '30MG'$  SUBCUTANEOUSLY EVERY 8 WEEKS 1 mL 6   fluticasone-salmeterol (WIXELA INHUB) 250-50 MCG/ACT AEPB INHALE 1 PUFF INTO THE LUNGS IN THE MORNING AND AT BEDTIME. 60 each 4   furosemide (LASIX) 40 MG tablet Take 40 mg by mouth daily.      gabapentin (NEURONTIN) 600 MG tablet Take 1 tablet (600 mg total) by mouth 2 (two) times daily. (Patient taking differently: Take 600-1,200 mg by mouth See admin instructions. Take 600 mg by mouth in the morning and take 1200 mg by mouth at bedtime) 60 tablet 2   guaiFENesin (MUCINEX) 600 MG 12 hr tablet Take 600 mg by mouth 2 (two) times daily.     ipratropium-albuterol (DUONEB) 0.5-2.5 (3) MG/3ML SOLN Take 3 mLs by nebulization every 6 (six) hours as needed. 75 mL 1   levalbuterol (XOPENEX) 1.25 MG/3ML nebulizer solution Take 1.25 mg by nebulization every 4 (four) hours as needed for wheezing. 72 mL 5   losartan (COZAAR) 100 MG tablet Take 100 mg  by mouth daily.     melatonin 5 MG TABS Take 5 mg by mouth at bedtime.     Menthol, Topical Analgesic, 4 % GEL Apply topically. Apply topically as needed to painful joints     mirabegron ER (MYRBETRIQ) 25 MG TB24 tablet Take 50 mg by mouth daily. For bladder control     montelukast (SINGULAIR) 10 MG tablet Take 1 tablet (10 mg total) by mouth at bedtime. 30 tablet 5   nystatin (MYCOSTATIN/NYSTOP) powder Apply 1 g topically 4 (four) times daily as needed (for skin folds).      polyethylene glycol (MIRALAX / GLYCOLAX) packet Take 17 g by mouth daily. (Patient taking differently: Take 17 g by mouth daily as needed for moderate constipation.) 14 each 0   rivaroxaban (XARELTO) 20 MG TABS tablet Take 20 mg by mouth daily with supper.     rOPINIRole (REQUIP) 0.5 MG tablet Take 1 mg by mouth at bedtime.     Tiotropium Bromide Monohydrate (SPIRIVA RESPIMAT) 1.25 MCG/ACT AERS Inhale 2 puffs into the lungs daily. 1 each 3   Current Facility-Administered Medications  Medication Dose Route Frequency Provider Last Rate Last Admin   Benralizumab SOSY 30 mg  30 mg Subcutaneous Q8 Toney Reil, MD   30 mg at 01/30/22 1507   ipratropium-albuterol (DUONEB) 0.5-2.5 (3) MG/3ML nebulizer solution 3 mL  3 mL Nebulization Once Roney Marion, MD       Allergies: Allergies  Allergen Reactions   Other Shortness Of Breath and Other (See Comments)    ROSEMARY AND HEAVY SCENTED PERFUMES, DETERGENTS, ETC   Tape Itching, Rash and Other (See Comments)    Burns and blisters USE PAPER TAPE   Trazodone And Nefazodone Shortness Of Breath and Swelling   Celebrex [Celecoxib] Other (See Comments)    HARD TIME HEARING, LIKE BEING UNDERWATER   Latex Hives, Itching and Rash   Mysoline [Primidone] Other (See Comments)    unknown   Niacin And Related Hives and Swelling   Sulfa Antibiotics Other (See Comments)    TONGUE BLISTERS     Ciprofloxacin Nausea Only  Gramineae Pollens     Other reaction(s): Other  (See Comments) Sneeze and cough   Aspirin Other (See Comments)    GI PROBLEMS AND PAIN   I reviewed her past medical history, social history, family history, and environmental history and no significant changes have been reported from her previous visit.  ROS: All others negative except as noted per HPI.   Objective: BP (!) 130/90   Pulse 65   Temp 98.1 F (36.7 C) (Temporal)   Resp 18   Wt 262 lb (118.8 kg)   SpO2 (!) 88%   BMI 43.60 kg/m  Body mass index is 43.6 kg/m. General Appearance:  Alert, cooperative, no distress, appears stated age  Head:  Normocephalic, without obvious abnormality, atraumatic  Eyes:  Conjunctiva clear, EOM's intact  Nose: Nares normal,   Throat: Lips, tongue normal; teeth and gums normal,   Neck: Supple, symmetrical  Lungs:   wheezing throughout, Respirations unlabored, intermittent dry coughing  Heart:  regular rate and rhythm and no murmur, Appears well perfused  Extremities: No edema  Skin: Skin color, texture, turgor normal, no rashes or lesions on visualized portions of skin  Neurologic: No gross deficits   Previous notes and tests were reviewed. The plan was reviewed with the patient/family, and all questions/concerned were addressed.  It was my pleasure to see Elaine Lee today and participate in her care. Please feel free to contact me with any questions or concerns.  Sincerely,  Roney Marion, MD  Allergy & Immunology  [facility] Office: [phone]

## 2022-02-27 NOTE — Progress Notes (Signed)
Patient instructed to the ER based on chest x-ray.  BMP and BNP came back normal but still concern for heart failure.  She has already been contacted with results.

## 2022-02-27 NOTE — Telephone Encounter (Signed)
Upon receiving chest Xray result, Dr. Edison Pace order that pt go to the ER ([facility]) for further evaluation. ER was informed that pt was on her way, and pt had appt with our office yesterday and she seemed very confused, vitals and demographics was given.

## 2022-03-07 NOTE — Telephone Encounter (Signed)
Error

## 2022-03-27 ENCOUNTER — Ambulatory Visit: Payer: 59

## 2022-03-29 ENCOUNTER — Ambulatory Visit (INDEPENDENT_AMBULATORY_CARE_PROVIDER_SITE_OTHER): Payer: 59

## 2022-03-29 DIAGNOSIS — J4550 Severe persistent asthma, uncomplicated: Secondary | ICD-10-CM | POA: Diagnosis not present

## 2022-03-29 DIAGNOSIS — J4551 Severe persistent asthma with (acute) exacerbation: Secondary | ICD-10-CM | POA: Diagnosis not present

## 2022-05-24 ENCOUNTER — Ambulatory Visit: Payer: 59

## 2022-05-24 NOTE — Progress Notes (Signed)
FOLLOW UP Date of Service/Encounter:  05/25/22   Subjective:  Elaine Lee (DOB: 17-Jun-1945) is a 77 y.o. female who [facility]22 in re-evaluation of the following: acute visit for yellow mucus History obtained from: chart review and patient and caregiver .  For Review, LV was on 02/26/22  with Dr. Marlynn Perking seen for acute visit for wheezing . This is my first time meeting this patient.  She has a complicated medical history with congestive heart failure, diabetes, chronic kidney disease, COPD, reflux and obesity. See below for summary of history and diagnostics.  She has severe persistent asthma on Fasenra as well. Therapeutic plans/changes recommended:  At her last visit she was wheezing with low oxygen levels.  A chest x-ray was ordered to evaluate for pulmonary edema.  She was given DuoNebs as well as as a BNP and BMP ordered she was started on a Z-Pak.  Following results for chest x-ray she was instructed to go to the emergency room due to concern for heart failure.  She was treated in the emergency room as asthma exacerbation following reassuring cardiac workup at that time. She was given azithromycin.  Pertinent History/Diagnostics:  - Asthma: moderate-severe, comorbid GERD, OSA on CPAP             - on wixela , spiriva 1.32mcg, montelulast              - severe restriction/obstruction spirometry (11/30/21): ratio 66, 0.80 L, 39% FEV1 (pre),              - Fasenra started 03/02/19 - Allergic Rhinitis:              - SPT environmental panel (11/06/2018): mold mix 4  Today presents for follow-up. She does have a cough which has been persistent for 4 months.  She was having sinus trouble and blowing out green mucus, and now feels that  her drainage has improved, but continues with persistent cough. She is not getting sleep at night due to coughing. Her cough is deep. She is sleeping in a lift chair to help with sleep. She is using  spiriva and wixela.  She uses her albuterol inhaler a few times per week if she goes out, not going out much. Will get SOB with activity. She also reports using her nebulizer machine around every 4-6 hours.  However, she is not sure which medication she is putting in her nebulizer machine.  Her caregiver is not sure either. She has been seen multiple times for this problem. She has been on different antibiotics, but these don't seem to help. She has had prednisone and this does help, but is short lived  She was in the hospital April 12th due to falling-hearing has worsened and has severe neuropathy.  She was on bedrest for 2 days. She is seeing a neurologist for her neuropathy and they have cut her gabapentin in half which is helping with shaky spells. Her weight is staying steady.  She has not been requiring extra doses of her Lasix. Her caregiver confirms that her weight has remained steady for the past several weeks. She is very interested in taking up classes at the [employer] [facility] aerobics.  She is concerned that they will send her home though due to her coughing. She is scheduled to have knee surgery, but they have canceled until she is able to lose another 20 pounds.   Allergies as of 05/25/2022  Reactions   Other Shortness Of Breath, Other (See Comments)   ROSEMARY AND HEAVY SCENTED PERFUMES, DETERGENTS, ETC   Tape Itching, Rash, Other (See Comments)   Burns and blisters USE PAPER TAPE   Trazodone And Nefazodone Shortness Of Breath, Swelling   Celebrex [celecoxib] Other (See Comments)   HARD TIME HEARING, LIKE BEING UNDERWATER   Latex Hives, Itching, Rash   Mysoline [primidone] Other (See Comments)   unknown   Niacin And Related Hives, Swelling   Sulfa Antibiotics Other (See Comments)   TONGUE BLISTERS    Ciprofloxacin Nausea Only   Gramineae Pollens    Other reaction(s): Other (See Comments) Sneeze and cough   Aspirin Other (See Comments)   GI PROBLEMS AND PAIN         Medication List        Accurate as of May 25, 2022 11:54 AM. If you have any questions, ask your nurse or doctor.          STOP taking these medications    azithromycin 250 MG tablet Commonly known as: Zithromax Stopped by: Verlee Monte, MD   losartan 100 MG tablet Commonly known as: COZAAR Stopped by: Verlee Monte, MD   rOPINIRole 0.5 MG tablet Commonly known as: REQUIP Stopped by: Verlee Monte, MD       TAKE these medications    acetaminophen 500 MG tablet Commonly known as: TYLENOL Take 500 mg by mouth every 6 (six) hours as needed.   albuterol 108 (90 Base) MCG/ACT inhaler Commonly known as: VENTOLIN HFA INHALE 1-2 PUFFS BY MOUTH EVERY 6 HOURS AS NEEDED FOR WHEEZE OR SHORTNESS OF BREATH   atorvastatin 10 MG tablet Commonly known as: LIPITOR Take 10 mg by mouth at bedtime.   budesonide 0.5 MG/2ML nebulizer solution Commonly known as: PULMICORT Take 2 mLs (0.5 mg total) by nebulization in the morning and at bedtime.   EPINEPHrine 0.3 mg/0.3 mL Soaj injection Commonly known as: EpiPen 2-Pak Use as directed for severe allergic reactions   famotidine 20 MG tablet Commonly known as: PEPCID Take 20 mg by mouth 2 (two) times daily.   Fasenra 30 MG/ML Sosy Generic drug: Benralizumab INJECT 30MG  SUBCUTANEOUSLY EVERY 8 WEEKS   furosemide 40 MG tablet Commonly known as: LASIX Take 40 mg by mouth daily.   gabapentin 600 MG tablet Commonly known as: NEURONTIN Take 1 tablet (600 mg total) by mouth 2 (two) times daily. What changed:  how much to take when to take this additional instructions   guaiFENesin 600 MG 12 hr tablet Commonly known as: MUCINEX Take 600 mg by mouth 2 (two) times daily.   ipratropium-albuterol 0.5-2.5 (3) MG/3ML Soln Commonly known as: DUONEB Take 3 mLs by nebulization every 6 (six) hours as needed.   levalbuterol 1.25 MG/3ML nebulizer solution Commonly known as: XOPENEX Take 1.25 mg by nebulization every 4 (four) hours as  needed for wheezing.   melatonin 5 MG Tabs Take 5 mg by mouth at bedtime.   Menthol (Topical Analgesic) 4 % Gel Apply topically. Apply topically as needed to painful joints   mirabegron ER 25 MG Tb24 tablet Commonly known as: MYRBETRIQ Take 50 mg by mouth daily. For bladder control   montelukast 10 MG tablet Commonly known as: SINGULAIR Take 1 tablet (10 mg total) by mouth at bedtime.   nystatin powder Commonly known as: MYCOSTATIN/NYSTOP Apply 1 g topically 4 (four) times daily as needed (for skin folds).   polyethylene glycol 17 g packet Commonly known as:  MIRALAX / GLYCOLAX Take 17 g by mouth daily. What changed:  when to take this reasons to take this   rivaroxaban 20 MG Tabs tablet Commonly known as: XARELTO Take 20 mg by mouth daily with supper.   Spiriva Respimat 1.25 MCG/ACT Aers Generic drug: Tiotropium Bromide Monohydrate Inhale 2 puffs into the lungs daily.   Wixela Inhub 250-50 MCG/ACT Aepb Generic drug: fluticasone-salmeterol INHALE 1 PUFF INTO THE LUNGS IN THE MORNING AND AT BEDTIME.       Past Medical History:  Diagnosis Date   A-fib (HCC)    Asthma    CHF (congestive heart failure) (HCC)    Chronic edema    CKD (chronic kidney disease), stage III (HCC)    COPD (chronic obstructive pulmonary disease) (HCC)    Degenerative lumbar disc    Depression    Diabetes mellitus without complication (HCC)    Diabetic neuropathy (HCC)    GERD (gastroesophageal reflux disease)    Gout    Hearing loss    Hypercholesteremia    Hypertension    Insomnia    Obstructive sleep apnea on CPAP    Osteoarthritis    knees and shoulders   Renal disorder    Urinary frequency    Past Surgical History:  Procedure Laterality Date   ABDOMINAL HYSTERECTOMY     KNEE ARTHROPLASTY Right 12/05/2017   Procedure: COMPUTER ASSISTED TOTAL KNEE ARTHROPLASTY;  Surgeon: Samson Frederic, MD;  Location: [facility];  Service: Orthopedics;  Laterality: Right;   ROTATOR CUFF REPAIR   1980   x3   TONSILLECTOMY     Otherwise, there have been no changes to her past medical history, surgical history, family history, or social history.  ROS: All others negative except as noted per HPI.   Objective:  BP 122/68   Pulse 75   Temp 98.1 F (36.7 C) (Temporal)   Resp (!) 22   Ht 5\' 5"  (1.651 m)   SpO2 92%   BMI 43.60 kg/m  Body mass index is 43.6 kg/m. Physical Exam: General Appearance:  Alert, cooperative, no distress, appears stated age  Head:  Normocephalic, without obvious abnormality, atraumatic  Eyes:  Conjunctiva clear, EOM's intact  Nose: Nares normal, hypertrophic turbinates and normal mucosa  Throat: Lips, tongue normal; teeth and gums normal, normal posterior oropharynx and + cobblestoning  Neck: Supple, symmetrical  Lungs:   Rales and wheezing throughout, Respirations unlabored, intermittent dry coughing  Heart:  regular rate and rhythm and no murmur, Appears well perfused  Extremities: No edema  Skin: Skin color, texture, turgor normal and no rashes or lesions on visualized portions of skin  Neurologic: No gross deficits   Spirometry:  Tracings reviewed. Her effort: Good reproducible efforts. FVC: 1.22L FEV1: 0.81L, 40% predicted FEV1/FVC ratio: 0.66 Interpretation:  nonobstructive ratio, low FEV1, and restriction possible . This is similar to her previous spirometry. Please see scanned spirometry results for details.  Assessment/Plan   Based on history and exam today, suspect that her current cough is multifactorial.  She does have congestive heart failure, but it seems that her weight has been steady for some time.  She also has reflux and severe asthma.  We discussed changing her from [place] to [place] to see if we can gain better control of her asthma with this.  I will also increase her Wixela to a higher dose.  We will add on budesonide twice daily for the time being.  I am giving her prednisone today to help with her lung  inflammation, and  she has been instructed to closely monitor her blood sugars and weight gain.  Asthma-not controlled. Controller meds:  Continue montelukast 10 mg once a day to prevent cough or wheeze Start Wixela 500-1 puff twice a day to prevent cough or wheeze Continue Spiriva 1.25 mcg 2 puffs once a day to prevent cough and wheeze Add Budesonide 0.5 mg (1 vial) in nebulizer machine twice daily 40 mg IM depomedrol given in clinic; tomorrow start 40 mg prednisone by mouth daily for 3 days, then 20 mg daily on day 5 then stop.  Rescue meds: AS NEEDED Continue albuterol 2 puffs or 1 vial via nebulizer once every 4 hours as needed for cough or wheeze You may use albuterol 2 puffs 5 to 15 minutes before activity to decrease cough or wheeze  Sick Plan:  Start Albuterol or Duonebs (1 vial) via nebulizer every 4 to 6 hours while awake for 2-3 days Schedule follow-up  Start Swimming at the [facility] provided in clinic  Will submit for [employer] - new injection for your asthma.   Received fasenra in clinic today (due for her dosing) will continue until able to make the switch  Congestive Heart Failure:  - please continue to follow-up with Cardiology and follow your weights  Allergic rhinitis Continue allergen avoidance measures to mold as listed below Continue Atrovent nasal spray 2 sprays in each nostril twice a day as needed for runny nose Continue azelastine nasal spray 2 sprays in each nostril twice a day as needed for runny nose Consider saline nasal rinses as needed for nasal symptoms. Use this before any medicated nasal sprays for best result   Reflux Continue dietary and lifestyle modifications as listed below Continue famotidine 20 mg twice a day   Obstructive sleep apnea Continue to follow with pulmonary and use CPAP as prescribed   Follow up: 8-12 weeks, sooner if needed.  Thank you so much for letting me partake in your care today.  Don't hesitate to reach out if you have any additional  concerns!  Tonny Bollman, MD [facility] [place]   Tonny Bollman, MD  [facility] [place]

## 2022-05-25 ENCOUNTER — Encounter: Payer: Self-pay | Admitting: Internal Medicine

## 2022-05-25 ENCOUNTER — Other Ambulatory Visit: Payer: Self-pay

## 2022-05-25 ENCOUNTER — Ambulatory Visit (INDEPENDENT_AMBULATORY_CARE_PROVIDER_SITE_OTHER): Payer: 59 | Admitting: Internal Medicine

## 2022-05-25 VITALS — BP 122/68 | HR 75 | Temp 98.1°F | Resp 22 | Ht 65.0 in

## 2022-05-25 DIAGNOSIS — J310 Chronic rhinitis: Secondary | ICD-10-CM

## 2022-05-25 DIAGNOSIS — J9621 Acute and chronic respiratory failure with hypoxia: Secondary | ICD-10-CM

## 2022-05-25 DIAGNOSIS — E1122 Type 2 diabetes mellitus with diabetic chronic kidney disease: Secondary | ICD-10-CM

## 2022-05-25 DIAGNOSIS — J4550 Severe persistent asthma, uncomplicated: Secondary | ICD-10-CM | POA: Diagnosis not present

## 2022-05-25 DIAGNOSIS — J441 Chronic obstructive pulmonary disease with (acute) exacerbation: Secondary | ICD-10-CM | POA: Diagnosis not present

## 2022-05-25 DIAGNOSIS — N183 Chronic kidney disease, stage 3 unspecified: Secondary | ICD-10-CM

## 2022-05-25 DIAGNOSIS — I4891 Unspecified atrial fibrillation: Secondary | ICD-10-CM

## 2022-05-25 MED ORDER — PREDNISONE 20 MG PO TABS: ORAL_TABLET | ORAL | 0 refills | Status: DC

## 2022-05-25 MED ORDER — METHYLPREDNISOLONE ACETATE 40 MG/ML IJ SUSP
40.0000 mg | Freq: Once | INTRAMUSCULAR | Status: AC
  Administered 2022-05-25: 40 mg via INTRAMUSCULAR

## 2022-05-25 MED ORDER — FLUTICASONE-SALMETEROL 500-50 MCG/ACT IN AEPB: 1.0000 | INHALATION_SPRAY | Freq: Two times a day (BID) | RESPIRATORY_TRACT | 3 refills | Status: DC

## 2022-05-25 MED ORDER — BUDESONIDE 0.5 MG/2ML IN SUSP: 0.5000 mg | Freq: Two times a day (BID) | RESPIRATORY_TRACT | 5 refills | Status: DC

## 2022-05-25 NOTE — Patient Instructions (Addendum)
Asthma-not controlled. Controller meds:  Continue montelukast 10 mg once a day to prevent cough or wheeze Start Wixela 500-1 puff twice a day to prevent cough or wheeze Continue Spiriva 1.25 mcg 2 puffs once a day to prevent cough and wheeze Add Budesonide 0.5 mg (1 vial) in nebulizer machine twice daily 40 mg IM depomedrol given in clinic; tomorrow start 40 mg prednisone by mouth daily for 3 days, then 20 mg daily on day 5 then stop.  Rescue meds: AS NEEDED Continue albuterol 2 puffs or 1 vial via nebulizer once every 4 hours as needed for cough or wheeze You may use albuterol 2 puffs 5 to 15 minutes before activity to decrease cough or wheeze  Sick Plan:  Start Albuterol or Duonebs (1 vial) via nebulizer every 4 to 6 hours while awake for 2-3 days Schedule follow-up  Start Swimming at the [facility] provided in clinic  Will submit for [employer] - new injection for your asthma.   Congestive Heart Failure:  - please continue to follow-up with Cardiology and follow your weights  Allergic rhinitis Continue allergen avoidance measures to mold as listed below Continue Atrovent nasal spray 2 sprays in each nostril twice a day as needed for runny nose Continue azelastine nasal spray 2 sprays in each nostril twice a day as needed for runny nose Consider saline nasal rinses as needed for nasal symptoms. Use this before any medicated nasal sprays for best result   Reflux Continue dietary and lifestyle modifications as listed below Continue famotidine 20 mg twice a day   Obstructive sleep apnea Continue to follow with pulmonary and use CPAP as prescribed   Follow up: 8-12 weeks, sooner if needed.  Thank you so much for letting me partake in your care today.  Don't hesitate to reach out if you have any additional concerns!  Tonny Bollman, MD [facility]

## 2022-05-25 NOTE — Addendum Note (Signed)
Addended by: Verlee Monte on: 05/25/2022 01:50 PM   Modules accepted: Orders

## 2022-05-31 ENCOUNTER — Telehealth: Payer: Self-pay | Admitting: *Deleted

## 2022-05-31 NOTE — Telephone Encounter (Signed)
-----   Message from Verlee Monte, MD sent at 05/25/2022  1:39 PM EDT ----- Hi Elaine Lee-can we see about switching her to tezspire? Currently on fasenra. Not controlled

## 2022-05-31 NOTE — Telephone Encounter (Addendum)
Patient Ins has denied coverage for Tezspire due to following and no history of IgE being drawn in past...please advise  Decision Notes: TEZSPIRE SOL 210MG  is denied because it is not on your plan's Drug List (formulary). Medication authorization requires the following: (1) You need to try this covered drug: Xolair solution for reconstitution*; OR (2) your doctor needs to give [place] specific medical reasons why the covered drug is not appropriate for you. Reviewed by: Janith Lima.Ph. *Please note: Covered drug(s) may require prior authorization.

## 2022-05-31 NOTE — Telephone Encounter (Signed)
Medical reasons not to use Xolair:  1-it is better suited for patients with "allergic asthma".  Her only positive allergy testing was weakly positives to a group of molds which are not thought to be very clinically relevant. Her flares do not correlate with mold exposure, so this would not be a great option for her. 2- she has a significant cardiac history and Xolair has listed on it's label possible increase in cardiovascular events, so she would not be the ideal candidate for this medication.

## 2022-07-03 ENCOUNTER — Other Ambulatory Visit: Payer: Self-pay | Admitting: Internal Medicine

## 2022-07-03 NOTE — Progress Notes (Signed)
FOLLOW UP Date of Service/Encounter:  07/04/22   Subjective:  Elaine Lee (DOB: [birth year]) is a 77 y.o. female who [facility] in re-evaluation of the following: chronic cough, asthma/COPD, allergic rhinitis History obtained from: chart review and patient.  For Review, LV was on 05/25/22  with Dr.Jaisha Villacres seen for routine follow-up. See below for summary of history and diagnostics.  Therapeutic plans/changes recommended: FEV1 40%, asthma not controlled. We switched her to tezspire. Wixela increased to 500 mcg dosing. ----------------------------------------------------- Pertinent History/Diagnostics:  Asthma/COPD: moderate-severe, comorbid GERD, OSA on CPAP complicated medical history with congestive heart failure, diabetes, chronic kidney disease, COPD, reflux and obesity   - on wixela , spiriva 1.95mcg, montelulast   - severe restriction/obstruction spirometry (11/30/21): ratio 66, 0.80 L, 39% FEV1 (pre),              - Fasenra started 03/02/19 Allergic Rhinitis:  - SPT environmental panel (11/06/2018): mold mix 4 --------------------------------------------------- Today presents for follow-up. She continues to have chronic coughing which is keeping her awake at night.  She gets some relief when she was on the prednisone at last visit.  However, her relief lasted for as long as she was on the medication.  As soon as she stopped the prednisone, her cough returned.  She feels chest tightness and reports difficulty breathing. Due for her fasenra the 12/13th of this month. Her insurance did not approve Tezspire requesting that she try Xolair first.  Do not feel this is a good option for her, so have sent a letter of appeal awaiting response. She has lost 30 lbs, eating less because she is alone. Has cut out meat. Denies lower extremity edema except on her left knee that needs to be replaced and the reason she wants to lose weight -was  told by surgical team that she would have to lose weight to qualify for surgery. She remains her spunky self with a great sense of humor. She does have significant facial bruising and reports having fallen getting out of her chair several weeks ago.  She is no longer on blood thinners and did not seek medical attention.  She states that her bruising is healing and she has not had any sequela.  She does have home health but they are only present in her home 2 to 3 hours a day in the mornings.  She is having a hard time living alone. She has not followed up with pulmonology in some time.  She does have appointment with cardiology towards the end of this month. She does report using Wixela, Spiriva, and budesonide as prescribed. She does report green and yellow sputum production.  Reports a lot of congestion in her chest that she is coughing up.  Allergies as of 07/04/2022       Reactions   Other Shortness Of Breath, Other (See Comments)   ROSEMARY AND HEAVY SCENTED PERFUMES, DETERGENTS, ETC   Tape Itching, Rash, Other (See Comments)   Burns and blisters USE PAPER TAPE   Trazodone And Nefazodone Shortness Of Breath, Swelling   Celebrex [celecoxib] Other (See Comments)   HARD TIME HEARING, LIKE BEING UNDERWATER   Latex Hives, Itching, Rash   Mysoline [primidone] Other (See Comments)   unknown   Niacin And Related Hives, Swelling   Sulfa Antibiotics Other (See Comments)   TONGUE BLISTERS    Ciprofloxacin Nausea Only   Gramineae Pollens    Other reaction(s): Other (See Comments) Sneeze and cough  Aspirin Other (See Comments)   GI PROBLEMS AND PAIN        Medication List        Accurate as of July 04, 2022  6:07 PM. If you have any questions, ask your nurse or doctor.          acetaminophen 500 MG tablet Commonly known as: TYLENOL Take 500 mg by mouth every 6 (six) hours as needed.   albuterol 108 (90 Base) MCG/ACT inhaler Commonly known as: VENTOLIN HFA INHALE 1-2 PUFFS BY  MOUTH EVERY 6 HOURS AS NEEDED FOR WHEEZE OR SHORTNESS OF BREATH   atorvastatin 10 MG tablet Commonly known as: LIPITOR Take 10 mg by mouth at bedtime.   azithromycin 250 MG tablet Commonly known as: Zithromax Take 2 tablets by mouth on day 1, then take 1 tablet by mouth daily for the following 4 days. Started by: Verlee Monte, MD   budesonide 0.5 MG/2ML nebulizer solution Commonly known as: PULMICORT Take 2 mLs (0.5 mg total) by nebulization in the morning and at bedtime.   EPINEPHrine 0.3 mg/0.3 mL Soaj injection Commonly known as: EpiPen 2-Pak Use as directed for severe allergic reactions   famotidine 20 MG tablet Commonly known as: PEPCID Take 20 mg by mouth 2 (two) times daily.   Fasenra 30 MG/ML prefilled syringe Generic drug: benralizumab INJECT 30MG  SUBCUTANEOUSLY EVERY 8 WEEKS   fluticasone-salmeterol 500-50 MCG/ACT Aepb Commonly known as: Wixela Inhub Inhale 1 puff into the lungs in the morning and at bedtime.   furosemide 40 MG tablet Commonly known as: LASIX Take 40 mg by mouth daily.   gabapentin 600 MG tablet Commonly known as: NEURONTIN Take 1 tablet (600 mg total) by mouth 2 (two) times daily. What changed:  how much to take when to take this additional instructions   guaiFENesin 600 MG 12 hr tablet Commonly known as: MUCINEX Take 600 mg by mouth 2 (two) times daily.   ipratropium-albuterol 0.5-2.5 (3) MG/3ML Soln Commonly known as: DUONEB Take 3 mLs by nebulization every 6 (six) hours as needed.   levalbuterol 1.25 MG/3ML nebulizer solution Commonly known as: XOPENEX Take 1.25 mg by nebulization every 4 (four) hours as needed for wheezing.   melatonin 5 MG Tabs Take 5 mg by mouth at bedtime.   Menthol (Topical Analgesic) 4 % Gel Apply topically. Apply topically as needed to painful joints   mirabegron ER 25 MG Tb24 tablet Commonly known as: MYRBETRIQ Take 50 mg by mouth daily. For bladder control   montelukast 10 MG tablet Commonly  known as: SINGULAIR Take 1 tablet (10 mg total) by mouth at bedtime.   nystatin powder Commonly known as: MYCOSTATIN/NYSTOP Apply 1 g topically 4 (four) times daily as needed (for skin folds).   polyethylene glycol 17 g packet Commonly known as: MIRALAX / GLYCOLAX Take 17 g by mouth daily. What changed:  when to take this reasons to take this   predniSONE 10 MG tablet Commonly known as: DELTASONE Take 40 mg (4 tablets) prednisone by mouth daily for 3 days, then 20 mg (2 tablets) daily on day 5 then 10 mg (1 tablet) on day 6 and continue daily What changed:  medication strength additional instructions Changed by: Verlee Monte, MD   rivaroxaban 20 MG Tabs tablet Commonly known as: XARELTO Take 20 mg by mouth daily with supper.   Spiriva Respimat 2.5 MCG/ACT Aers Generic drug: Tiotropium Bromide Monohydrate TAKE 1 PUFF BY MOUTH EVERY DAY   Spiriva Respimat 1.25 MCG/ACT Aers  Generic drug: Tiotropium Bromide Monohydrate Inhale 2 puffs into the lungs daily.       Past Medical History:  Diagnosis Date   A-fib (HCC)    Asthma    CHF (congestive heart failure) (HCC)    Chronic edema    CKD (chronic kidney disease), stage III (HCC)    COPD (chronic obstructive pulmonary disease) (HCC)    Degenerative lumbar disc    Depression    Diabetes mellitus without complication (HCC)    Diabetic neuropathy (HCC)    GERD (gastroesophageal reflux disease)    Gout    Hearing loss    Hypercholesteremia    Hypertension    Insomnia    Obstructive sleep apnea on CPAP    Osteoarthritis    knees and shoulders   Renal disorder    Urinary frequency    Past Surgical History:  Procedure Laterality Date   ABDOMINAL HYSTERECTOMY     KNEE ARTHROPLASTY Right 12/05/2017   Procedure: COMPUTER ASSISTED TOTAL KNEE ARTHROPLASTY;  Surgeon: Samson Frederic, MD;  Location: [facility];  Service: Orthopedics;  Laterality: Right;   ROTATOR CUFF REPAIR  1980   x3   TONSILLECTOMY     Otherwise, there  have been no changes to her past medical history, surgical history, family history, or social history.  ROS: All others negative except as noted per HPI.   Objective:  BP 124/66   Pulse 70   Temp 97.9 F (36.6 C) (Temporal)   Resp 15   SpO2 96%  There is no height or weight on file to calculate BMI. Physical Exam: General Appearance:  Alert, cooperative, no distress, appears stated age  Head:  Normocephalic, without obvious abnormality, atraumatic  Eyes:  Conjunctiva clear, EOM's intact  Nose: Nares normal, normal mucosa  Throat: Lips, tongue normal; teeth and gums normal, normal posterior oropharynx  Neck: Supple, symmetrical  Lungs:   wheezing throughout, Respirations unlabored,  constant dry hacking cough  Heart:  regular rate and rhythm and no murmur, Appears well perfused  Extremities: No edema  Skin: Ecchymosis and shades of light purple's and green hue's on forehead and bilateral cheeks  Neurologic: No gross deficits   Spirometry: unable to obtain due to coughing  Assessment/Plan  She continues to have extremely uncontrolled asthma with a complicated medical history including congestive heart failure.  I am going to put her on low-dose daily prednisone, but I am giving her a burst today.  She is aware to keep track of her blood sugars and has short acting insulin to use if needed.  I will also be referring to pulmonary for additional support with this patient.  She will continue to follow-up with cardiology.  I will look into where we are at with her tezspire appeal.  Asthma/COPD-not controlled with exacerbation. Controller meds:  Continue montelukast 10 mg once a day to prevent cough or wheeze Start Wixela 500-1 puff twice a day to prevent cough or wheeze Continue Spiriva 1.25 mcg 2 puffs once a day to prevent cough and wheeze Add Budesonide 0.5 mg (1 vial) in nebulizer machine twice daily 80 mg IM depomedrol given in clinic; tomorrow start 40 mg (4 tablets) prednisone by  mouth daily for 3 days, then 20 mg (2 tablets) daily on day 5 then 10 mg (1 tablet) on day 6 and continue daily Keep track of your blood sugars Referral to Pulmonology Z-Pak -start today  Rescue meds: AS NEEDED Continue albuterol 2 puffs or 1 vial via nebulizer once every  4 hours as needed for cough or wheeze You may use albuterol 2 puffs 5 to 15 minutes before activity to decrease cough or wheeze  Sick Plan:  Start Albuterol or Duonebs (1 vial) via nebulizer every 4 to 6 hours while awake for 2-3 days Schedule follow-up  Start Swimming at the [facility] provided in clinic Add back protein to you diet so you don't lose muscle mass  I will find out what's going on Tezspire. Continue Fasenra for now.  Congestive Heart Failure:  - please continue to follow-up with Cardiology and follow your weights  Allergic rhinitis Continue allergen avoidance measures to mold as listed below Continue Atrovent nasal spray 2 sprays in each nostril twice a day as needed for runny nose Continue azelastine nasal spray 2 sprays in each nostril twice a day as needed for runny nose Consider saline nasal rinses as needed for nasal symptoms. Use this before any medicated nasal sprays for best result   Reflux Continue dietary and lifestyle modifications as listed below Continue famotidine 20 mg twice a day   Obstructive sleep apnea Continue to follow with pulmonary and use CPAP as prescribed   Follow up: 6-8 weeks, sooner if needed.  Thank you so much for letting me partake in your care today.  Don't hesitate to reach out if you have any additional concerns!  Tonny Bollman, MD  [facility]

## 2022-07-04 ENCOUNTER — Ambulatory Visit (INDEPENDENT_AMBULATORY_CARE_PROVIDER_SITE_OTHER): Payer: 59 | Admitting: Internal Medicine

## 2022-07-04 ENCOUNTER — Encounter: Payer: Self-pay | Admitting: Internal Medicine

## 2022-07-04 VITALS — BP 124/66 | HR 70 | Temp 97.9°F | Resp 15

## 2022-07-04 DIAGNOSIS — I509 Heart failure, unspecified: Secondary | ICD-10-CM | POA: Diagnosis not present

## 2022-07-04 DIAGNOSIS — E1122 Type 2 diabetes mellitus with diabetic chronic kidney disease: Secondary | ICD-10-CM

## 2022-07-04 DIAGNOSIS — J441 Chronic obstructive pulmonary disease with (acute) exacerbation: Secondary | ICD-10-CM

## 2022-07-04 DIAGNOSIS — J4551 Severe persistent asthma with (acute) exacerbation: Secondary | ICD-10-CM | POA: Diagnosis not present

## 2022-07-04 DIAGNOSIS — R062 Wheezing: Secondary | ICD-10-CM

## 2022-07-04 MED ORDER — AZITHROMYCIN 250 MG PO TABS
ORAL_TABLET | ORAL | 0 refills | Status: DC
Start: 2022-07-04 — End: 2022-11-27

## 2022-07-04 MED ORDER — PREDNISONE 10 MG PO TABS: ORAL_TABLET | ORAL | 1 refills | Status: DC

## 2022-07-04 MED ORDER — METHYLPREDNISOLONE ACETATE 80 MG/ML IJ SUSP
80.0000 mg | Freq: Once | INTRAMUSCULAR | Status: AC
  Administered 2022-07-04: 80 mg via INTRAMUSCULAR

## 2022-07-04 NOTE — Patient Instructions (Addendum)
Asthma/COPD-not controlled. Controller meds:  Continue montelukast 10 mg once a day to prevent cough or wheeze Start Wixela 500-1 puff twice a day to prevent cough or wheeze Continue Spiriva 1.25 mcg 2 puffs once a day to prevent cough and wheeze Add Budesonide 0.5 mg (1 vial) in nebulizer machine twice daily 80 mg IM depomedrol given in clinic; tomorrow start 40 mg (4 tablets) prednisone by mouth daily for 3 days, then 20 mg (2 tablets) daily on day 5 then 10 mg (1 tablet) on day 6 and continue daily Keep track of your blood sugars Referral to Pulmonology Z-Pak-start today  Rescue meds: AS NEEDED Continue albuterol 2 puffs or 1 vial via nebulizer once every 4 hours as needed for cough or wheeze You may use albuterol 2 puffs 5 to 15 minutes before activity to decrease cough or wheeze  Sick Plan:  Start Albuterol or Duonebs (1 vial) via nebulizer every 4 to 6 hours while awake for 2-3 days Schedule follow-up  Start Swimming at the [facility] provided in clinic Add back protein to you diet so you don't lose muscle mass  I will find out what's going on Tezspire. Continue Fasenra for now.  Congestive Heart Failure:  - please continue to follow-up with Cardiology and follow your weights  Allergic rhinitis Continue allergen avoidance measures to mold as listed below Continue Atrovent nasal spray 2 sprays in each nostril twice a day as needed for runny nose Continue azelastine nasal spray 2 sprays in each nostril twice a day as needed for runny nose Consider saline nasal rinses as needed for nasal symptoms. Use this before any medicated nasal sprays for best result   Reflux Continue dietary and lifestyle modifications as listed below Continue famotidine 20 mg twice a day   Obstructive sleep apnea Continue to follow with pulmonary and use CPAP as prescribed   Follow up: 6-8 weeks, sooner if needed.  Thank you so much for letting me partake in your care today.  Don't hesitate to  reach out if you have any additional concerns!  Tonny Bollman, MD [facility]

## 2022-07-04 NOTE — Addendum Note (Signed)
Addended by: Modesto Charon on: 07/04/2022 06:35 PM   Modules accepted: Orders

## 2022-07-09 ENCOUNTER — Other Ambulatory Visit (HOSPITAL_COMMUNITY): Payer: Self-pay

## 2022-07-09 NOTE — Telephone Encounter (Signed)
Prescription for DuoNebs can be refilled but not azithromycin.  Can we resubmit for the DuoNebs.

## 2022-07-18 ENCOUNTER — Telehealth: Payer: Self-pay

## 2022-07-18 NOTE — Telephone Encounter (Signed)
Referral has been placed to [facility].

## 2022-07-18 NOTE — Telephone Encounter (Signed)
-----   Message from Verlee Monte sent at 07/04/2022  6:04 PM EDT ----- Can we refer to [facility] please?

## 2022-07-20 ENCOUNTER — Ambulatory Visit: Payer: 59

## 2022-07-24 ENCOUNTER — Ambulatory Visit: Payer: 59

## 2022-07-25 ENCOUNTER — Ambulatory Visit: Payer: 59

## 2022-07-31 ENCOUNTER — Ambulatory Visit (INDEPENDENT_AMBULATORY_CARE_PROVIDER_SITE_OTHER): Payer: 59

## 2022-07-31 DIAGNOSIS — J4550 Severe persistent asthma, uncomplicated: Secondary | ICD-10-CM | POA: Diagnosis not present

## 2022-08-14 ENCOUNTER — Telehealth: Payer: Self-pay | Admitting: *Deleted

## 2022-08-14 NOTE — Telephone Encounter (Signed)
-----   Message from Verlee Monte sent at 07/04/2022  6:06 PM EDT ----- Hi , were we able to send in an appeal for Tezspire?  Thanks!  Denny Peon

## 2022-08-14 NOTE — Progress Notes (Unsigned)
FOLLOW UP Date of Service/Encounter:  08/15/22  Subjective:  Elaine Lee (DOB: 01-11-1945) is a 77 y.o. female who [facility] in re-evaluation of the following: chronic cough, asthma/COPD, allergic rhinitis  History obtained from: chart review and patient.  For Review, LV was on 07/04/22  with Dr. seen for routine follow-up. See below for summary of history and diagnostics.   Therapeutic plans/changes recommended: Coughing not controlled, does find relief when on prednisone.  Continued to report chest tightness and difficulty breathing.  Not controlled on Fasenra.  Had lost 30 pounds due to eating less since she is home alone.  Cut out meat from her diet. We put her on low-dose daily prednisone, and referred her to pulmonary.  We appealed her denial of [name] which was overturned.  We also increased her Wixela to 500 mcg 1 puff twice daily. She was referred to pulmonary at that time. ----------------------------------------------------- Pertinent History/Diagnostics:  Asthma/COPD: moderate-severe, comorbid GERD, OSA on CPAP complicated medical history with congestive heart failure, diabetes, chronic kidney disease, COPD, reflux and obesity   - on wixela 500 mcg, spiriva 1.93mcg, montelulast, added prednisone 10 mg daily (07/04/22).  - severe restriction/obstruction spirometry (11/30/21): ratio 66, 0.80 L, 39% FEV1 (pre),              - Fasenra started 03/02/19 - Tezspire approved August 2024.   Allergic Rhinitis:  - SPT environmental panel (11/06/2018): mold mix 4 Other: Congestive heart failure --------------------------------------------------- Today presents for follow-up. [name] reports that she is doing well at today's visit.  Today was her last day of prednisone, however when she was taking the prednisone it was the best she had breathed in some time.  She still has her cough, but it is much improved from prior.  She  also reports that she was able to walk much better when taking prednisone.  She continues taking her Wixela twice daily and Spiriva once daily.  She is not using budesonide via nebulizer as prescribed at last visit.  She has a scheduled visit with pulmonary next week.  She continues to feel stuffy in her head and nose.  She feels a ringing in her ears.  She does wear hearing aids. She has gained 5 pounds since her last visit which she relates to being on prednisone. She also has had issues with A-fib since being on prednisone. She is still receiving her Fasenra injections, is not sure when her next 1 is due.  She has not had great improvement while on this injectable medication. She reports that her reflux is constant.  She tries reflux precaution and takes her famotidine twice daily.  She does not remember being on any stronger medications than famotidine. She is not using her CPAP machine and suspects that pulmonary will encourage her to do so at next week's visit. She does have a planned surgery next week for a tooth extraction.  Chart Review: Seen by cardiology 07/30/2022-restarted on Xarelto nightly.  CPAP therapy advised.  Zio patch placed to assess burden of A-fib  All medications reviewed by clinical staff and updated in chart. No new pertinent medical or surgical history except as noted in HPI.  ROS: All others negative except as noted per HPI.   Objective:  BP 138/76 (BP Location: Left Arm, Patient Position: Sitting, Cuff Size: Normal)   Pulse 65   Temp 98.1 F (36.7 C) (Temporal)   Resp 20   SpO2 95%  There is  no height or weight on file to calculate BMI. Physical Exam: General Appearance:  Alert, cooperative, no distress, appears stated age  Head:  Normocephalic, without obvious abnormality, atraumatic  Eyes:  Conjunctiva clear, EOM's intact  Ears EACs normal bilaterally and normal TMs bilaterally  Nose: Nares normal, normal mucosa and no visible anterior polyps  Throat:  Lips, tongue normal; teeth and gums normal, normal posterior oropharynx  Neck: Supple, symmetrical  Lungs:   clear to auscultation bilaterally, Respirations unlabored, no coughing  Heart:  regular rate and rhythm, Appears well perfused  Extremities: No edema  Skin: Minimal bruising on forehead and near right eye much improved from prior visit  Neurologic: No gross deficits   Labs:  Lab Orders  No laboratory test(s) ordered today    Spirometry:  Tracings reviewed. Her effort: It was hard to get consistent efforts and there is a question as to whether this reflects a maximal maneuver. FVC: 1.50L FEV1: 1.09L, 54% predicted FEV1/FVC ratio: 0.73 Interpretation: Nonobstructive ratio, low FEV1, possible restriction. FEV1 improved from previous visit 40% Please see scanned spirometry results for details.  Assessment/Plan   Her breathing was significantly more controlled, as is her spirometry today, when on low-dose daily prednisone.  However this is not a great long-term plan for her, and luckily her [name] was approved yesterday.  She will follow-up with pulmonary next week, but I have encouraged her to use budesonide via nebulizer twice daily since she will stopping prednisone today. I anticipate she will improve once starting Tezspire.  Asthma/COPD-not at goal, improved when on low-dose daily prednisone. Switching to Tezspire and adding twice daily budesonide via nebulizer. Controller meds:  Continue montelukast 10 mg once a day to prevent cough or wheeze Start Wixela 500-1 puff twice a day to prevent cough or wheeze Continue Spiriva 1.25 mcg 2 puffs once a day to prevent cough and wheeze Add Budesonide 0.5 mg (1 vial) in nebulizer machine twice daily Follow-up with Pulmonary as planned. Start Tezspire-will contact you once delivery date set  Rescue meds: AS NEEDED Continue albuterol 2 puffs or 1 vial via nebulizer once every 4 hours as needed for cough or wheeze You may use  albuterol 2 puffs 5 to 15 minutes before activity to decrease cough or wheeze  Sick Plan:  Start Albuterol or Duonebs (1 vial) via nebulizer every 4 to 6 hours while awake for 2-3 days Schedule follow-up  Start Swimming at the [facility] provided in clinic Add back protein to you diet so you don't lose muscle mass  Congestive Heart Failure:  - please continue to follow-up with Cardiology and follow your weights  Allergic rhinitis-stable Continue allergen avoidance measures to mold as listed below Continue Atrovent nasal spray 2 sprays in each nostril twice a day as needed for runny nose Continue azelastine nasal spray 2 sprays in each nostril twice a day as needed for runny nose Consider saline nasal rinses as needed for nasal symptoms. Use this before any medicated nasal sprays for best result   Reflux-not controlled Continue dietary and lifestyle modifications as listed below Start omeprazole 40 mg twice daily for 3 months   Obstructive sleep apnea- Continue to follow with pulmonary and highly recommend restarting CPAP as prescribed   Follow up: 12 weeks, sooner if needed.  Thank you so much for letting me partake in your care today.  Don't hesitate to reach out if you have any additional concerns!  Tonny Bollman, MD  [facility]

## 2022-08-14 NOTE — Telephone Encounter (Signed)
thanks

## 2022-08-14 NOTE — Telephone Encounter (Signed)
Called patient and advised appeal approval for Tezspire and Rx to [facility] will reach out once delivery set to make appt to start therapy

## 2022-08-15 ENCOUNTER — Encounter: Payer: Self-pay | Admitting: Internal Medicine

## 2022-08-15 ENCOUNTER — Ambulatory Visit (INDEPENDENT_AMBULATORY_CARE_PROVIDER_SITE_OTHER): Payer: 59 | Admitting: Internal Medicine

## 2022-08-15 VITALS — BP 138/76 | HR 65 | Temp 98.1°F | Resp 20

## 2022-08-15 DIAGNOSIS — J441 Chronic obstructive pulmonary disease with (acute) exacerbation: Secondary | ICD-10-CM

## 2022-08-15 DIAGNOSIS — J310 Chronic rhinitis: Secondary | ICD-10-CM

## 2022-08-15 DIAGNOSIS — J4550 Severe persistent asthma, uncomplicated: Secondary | ICD-10-CM

## 2022-08-15 DIAGNOSIS — K219 Gastro-esophageal reflux disease without esophagitis: Secondary | ICD-10-CM

## 2022-08-15 DIAGNOSIS — G4733 Obstructive sleep apnea (adult) (pediatric): Secondary | ICD-10-CM

## 2022-08-15 DIAGNOSIS — I509 Heart failure, unspecified: Secondary | ICD-10-CM

## 2022-08-15 MED ORDER — SPIRIVA RESPIMAT 2.5 MCG/ACT IN AERS: 1.0000 | INHALATION_SPRAY | Freq: Every day | RESPIRATORY_TRACT | 5 refills | Status: DC

## 2022-08-15 MED ORDER — OMEPRAZOLE 40 MG PO CPDR: 40.0000 mg | DELAYED_RELEASE_CAPSULE | Freq: Two times a day (BID) | ORAL | 5 refills | Status: DC

## 2022-08-15 MED ORDER — BUDESONIDE 0.5 MG/2ML IN SUSP: 0.5000 mg | Freq: Two times a day (BID) | RESPIRATORY_TRACT | 5 refills | Status: DC

## 2022-08-15 MED ORDER — FLUTICASONE-SALMETEROL 500-50 MCG/ACT IN AEPB: 1.0000 | INHALATION_SPRAY | Freq: Two times a day (BID) | RESPIRATORY_TRACT | 3 refills | Status: DC

## 2022-08-15 NOTE — Patient Instructions (Addendum)
Asthma/COPD- Controller meds:  Continue montelukast 10 mg once a day to prevent cough or wheeze Start Wixela 500-1 puff twice a day to prevent cough or wheeze Continue Spiriva 1.25 mcg 2 puffs once a day to prevent cough and wheeze Add Budesonide 0.5 mg (1 vial) in nebulizer machine twice daily Follow-up with Pulmonary as planned. Start Tezspire-will contact you once delivery date set  Rescue meds: AS NEEDED Continue albuterol 2 puffs or 1 vial via nebulizer once every 4 hours as needed for cough or wheeze You may use albuterol 2 puffs 5 to 15 minutes before activity to decrease cough or wheeze  Sick Plan:  Start Albuterol or Duonebs (1 vial) via nebulizer every 4 to 6 hours while awake for 2-3 days Schedule follow-up  Start Swimming at the [facility] provided in clinic Add back protein to you diet so you don't lose muscle mass  Congestive Heart Failure:  - please continue to follow-up with Cardiology and follow your weights  Allergic rhinitis Continue allergen avoidance measures to mold as listed below Continue Atrovent nasal spray 2 sprays in each nostril twice a day as needed for runny nose Continue azelastine nasal spray 2 sprays in each nostril twice a day as needed for runny nose Consider saline nasal rinses as needed for nasal symptoms. Use this before any medicated nasal sprays for best result   Reflux Continue dietary and lifestyle modifications as listed below Start omeprazole 40 mg twice daily for 3 months   Obstructive sleep apnea Continue to follow with pulmonary and highly recommend restarting CPAP as prescribed   Follow up: 12 weeks, sooner if needed.  Thank you so much for letting me partake in your care today.  Don't hesitate to reach out if you have any additional concerns!  Tonny Bollman, MD [facility]

## 2022-08-28 ENCOUNTER — Ambulatory Visit: Payer: 59

## 2022-08-30 ENCOUNTER — Ambulatory Visit: Payer: 59

## 2022-09-04 ENCOUNTER — Ambulatory Visit (INDEPENDENT_AMBULATORY_CARE_PROVIDER_SITE_OTHER): Payer: 59

## 2022-09-04 DIAGNOSIS — J4550 Severe persistent asthma, uncomplicated: Secondary | ICD-10-CM

## 2022-09-04 MED ORDER — TEZEPELUMAB-EKKO 210 MG/1.91ML ~~LOC~~ SOSY
210.0000 mg | PREFILLED_SYRINGE | SUBCUTANEOUS | Status: AC
Start: 2022-09-04 — End: ?
  Administered 2022-09-04 – 2023-05-08 (×10): 210 mg via SUBCUTANEOUS

## 2022-09-04 NOTE — Progress Notes (Signed)
Immunotherapy   Patient Details  Name: Elaine Lee MRN: [id number] Date of Birth: Jan 25, 1945  09/04/2022  [name] started injections for Tezspire 210 mg/1.91 mL  Frequency: Q 4 weeks Epi-Pen: Consent signed and patient instructions given.   Gracy Ehly J Korvin Valentine 09/04/2022, 2:12 PM

## 2022-09-20 ENCOUNTER — Institutional Professional Consult (permissible substitution): Payer: Medicare (Managed Care) | Admitting: Pulmonary Disease

## 2022-09-24 ENCOUNTER — Encounter: Payer: Self-pay | Admitting: Pulmonary Disease

## 2022-09-25 ENCOUNTER — Ambulatory Visit: Payer: 59

## 2022-10-01 ENCOUNTER — Ambulatory Visit (INDEPENDENT_AMBULATORY_CARE_PROVIDER_SITE_OTHER): Payer: 59

## 2022-10-01 DIAGNOSIS — J4550 Severe persistent asthma, uncomplicated: Secondary | ICD-10-CM | POA: Diagnosis not present

## 2022-10-20 ENCOUNTER — Other Ambulatory Visit: Payer: Self-pay | Admitting: Family Medicine

## 2022-10-29 ENCOUNTER — Ambulatory Visit (INDEPENDENT_AMBULATORY_CARE_PROVIDER_SITE_OTHER): Payer: 59

## 2022-10-29 DIAGNOSIS — J4550 Severe persistent asthma, uncomplicated: Secondary | ICD-10-CM | POA: Diagnosis not present

## 2022-11-16 ENCOUNTER — Ambulatory Visit: Payer: 59 | Admitting: Internal Medicine

## 2022-11-19 ENCOUNTER — Emergency Department (HOSPITAL_BASED_OUTPATIENT_CLINIC_OR_DEPARTMENT_OTHER): Payer: 59

## 2022-11-19 ENCOUNTER — Emergency Department (HOSPITAL_BASED_OUTPATIENT_CLINIC_OR_DEPARTMENT_OTHER)
Admission: EM | Admit: 2022-11-19 | Discharge: 2022-11-19 | Disposition: A | Discharge status: Home / Self Care | Payer: 59 | Attending: Emergency Medicine | Admitting: Emergency Medicine

## 2022-11-19 ENCOUNTER — Other Ambulatory Visit: Payer: Self-pay

## 2022-11-19 ENCOUNTER — Encounter (HOSPITAL_BASED_OUTPATIENT_CLINIC_OR_DEPARTMENT_OTHER): Payer: Self-pay | Admitting: Urology

## 2022-11-19 DIAGNOSIS — L0201 Cutaneous abscess of face: Secondary | ICD-10-CM | POA: Diagnosis present

## 2022-11-19 DIAGNOSIS — I509 Heart failure, unspecified: Secondary | ICD-10-CM | POA: Diagnosis not present

## 2022-11-19 DIAGNOSIS — R220 Localized swelling, mass and lump, head: Secondary | ICD-10-CM | POA: Insufficient documentation

## 2022-11-19 DIAGNOSIS — N189 Chronic kidney disease, unspecified: Secondary | ICD-10-CM | POA: Diagnosis not present

## 2022-11-19 DIAGNOSIS — J45909 Unspecified asthma, uncomplicated: Secondary | ICD-10-CM | POA: Insufficient documentation

## 2022-11-19 DIAGNOSIS — Z91040 Latex allergy status: Secondary | ICD-10-CM | POA: Diagnosis not present

## 2022-11-19 DIAGNOSIS — J449 Chronic obstructive pulmonary disease, unspecified: Secondary | ICD-10-CM | POA: Insufficient documentation

## 2022-11-19 DIAGNOSIS — E1122 Type 2 diabetes mellitus with diabetic chronic kidney disease: Secondary | ICD-10-CM | POA: Insufficient documentation

## 2022-11-19 DIAGNOSIS — L0291 Cutaneous abscess, unspecified: Secondary | ICD-10-CM

## 2022-11-19 LAB — BASIC METABOLIC PANEL
Anion gap: 6 (ref 5–15)
BUN: 23 mg/dL (ref 8–23)
CO2: 29 mmol/L (ref 22–32)
Calcium: 8.5 mg/dL — ABNORMAL LOW (ref 8.9–10.3)
Chloride: 105 mmol/L (ref 98–111)
Creatinine, Ser: 0.98 mg/dL (ref 0.44–1.00)
GFR, Estimated: 59 mL/min — ABNORMAL LOW (ref >60)
Glucose, Bld: 68 mg/dL — ABNORMAL LOW (ref 70–99)
Potassium: 3.7 mmol/L (ref 3.5–5.1)
Sodium: 140 mmol/L (ref 135–145)

## 2022-11-19 LAB — CBC
HCT: 34.7 % — ABNORMAL LOW (ref 36.0–46.0)
Hemoglobin: 11.1 g/dL — ABNORMAL LOW (ref 12.0–15.0)
MCH: 28 pg (ref 26.0–34.0)
MCHC: 32 g/dL (ref 30.0–36.0)
MCV: 87.6 fL (ref 80.0–100.0)
Platelets: 209 10*3/uL (ref 150–400)
RBC: 3.96 MIL/uL (ref 3.87–5.11)
RDW: 13.8 % (ref 11.5–15.5)
WBC: 8.5 10*3/uL (ref 4.0–10.5)
nRBC: 0 % (ref 0.0–0.2)

## 2022-11-19 LAB — CBG MONITORING, ED
Glucose-Capillary: 56 mg/dL — ABNORMAL LOW (ref 70–99)
Glucose-Capillary: 83 mg/dL (ref 70–99)

## 2022-11-19 MED ORDER — DOXYCYCLINE HYCLATE 100 MG PO CAPS: 100.0000 mg | ORAL_CAPSULE | Freq: Two times a day (BID) | ORAL | 0 refills | Status: DC

## 2022-11-19 MED ORDER — LIDOCAINE HCL 2 % IJ SOLN
10.0000 mL | Freq: Once | INTRAMUSCULAR | Status: AC
  Administered 2022-11-19: 200 mg
  Filled 2022-11-19: qty 20

## 2022-11-19 MED ORDER — DOXYCYCLINE HYCLATE 100 MG PO TABS
100.0000 mg | ORAL_TABLET | Freq: Once | ORAL | Status: AC
  Administered 2022-11-19: 100 mg via ORAL
  Filled 2022-11-19: qty 1

## 2022-11-19 NOTE — ED Notes (Signed)
Fall risk socks Fall risk sign on door Fall risk armband

## 2022-11-19 NOTE — ED Triage Notes (Signed)
Last Monday hit head when bending over  Area on right forehead swollen and red   Pt not on blood thinners, No LOC with injury

## 2022-11-19 NOTE — ED Provider Notes (Cosign Needed Addendum)
[place] [facility] AT [facility] Provider Note   CSN: [id number] Arrival date & time: 11/19/22  1519     History  Chief Complaint  Patient presents with   Head Injury    Elaine Lee is a 77 y.o. female with medical history of A-fib, asthma, CHF, CKD, COPD, diabetes.  Patient presents to ED for evaluation of growth to the left portion of her forehead.  She reports that 1 week ago she developed progressively worsening swelling to the left portion of her head above her left eye.  She states that she is unsure if she fell to cause this, she states that it might of been a spider bite, she is unsure.  She reports [place] the [place] and it continued to get larger.  She was seen at a urgent care but due to the location of this growth they referred her to the ED for further management.  She states that she is taking a blood thinner but has not taken it in 3 days.  She denies vision changes, fevers, headache, neck pain, nausea or vomiting.  Reports that she was seen at her cardiologist office this morning where she had Zio patch placed but she made no mention of this area.   Head Injury Associated symptoms: no headaches, no nausea, no neck pain and no vomiting        Home Medications Prior to Admission medications   Medication Sig Start Date End Date Taking? Authorizing Provider  doxycycline (VIBRAMYCIN) 100 MG capsule Take 1 capsule (100 mg total) by mouth 2 (two) times daily. 11/19/22  Yes Al Decant, PA-C  acetaminophen (TYLENOL) 500 MG tablet Take 500 mg by mouth every 6 (six) hours as needed.    [provider]  albuterol (VENTOLIN HFA) 108 (90 Base) MCG/ACT inhaler INHALE 1-2 PUFFS BY MOUTH EVERY 6 HOURS AS NEEDED FOR WHEEZE OR SHORTNESS OF BREATH 07/10/21   Ambs, Norvel Richards, FNP  atorvastatin (LIPITOR) 10 MG tablet Take 10 mg by mouth at bedtime.     [provider]  azithromycin (ZITHROMAX) 250 MG tablet Take 2 tablets by  mouth on day 1, then take 1 tablet by mouth daily for the following 4 days. 07/04/22   Verlee Monte, MD  budesonide (PULMICORT) 0.5 MG/2ML nebulizer solution Take 2 mLs (0.5 mg total) by nebulization in the morning and at bedtime. 08/15/22   Verlee Monte, MD  EPINEPHrine (EPIPEN 2-PAK) 0.3 mg/0.3 mL IJ SOAJ injection Use as directed for severe allergic reactions 12/05/21   Ambs, Norvel Richards, FNP  famotidine (PEPCID) 20 MG tablet Take 20 mg by mouth 2 (two) times daily.    [provider]  FASENRA 30 MG/ML SOSY INJECT 30MG  SUBCUTANEOUSLY EVERY 8 WEEKS 01/16/22   Alfonse Spruce, MD  fluticasone-salmeterol [facility] INHUB) 500-50 MCG/ACT AEPB Inhale 1 puff into the lungs in the morning and at bedtime. 08/15/22   Verlee Monte, MD  furosemide (LASIX) 40 MG tablet Take 40 mg by mouth daily.     [provider]  gabapentin (NEURONTIN) 600 MG tablet Take 1 tablet (600 mg total) by mouth 2 (two) times daily. Patient taking differently: Take 600-1,200 mg by mouth See admin instructions. Take 600 mg by mouth in the morning and take 1200 mg by mouth at bedtime 06/16/14   Moding, Adrian Blackwater, MD  guaiFENesin (MUCINEX) 600 MG 12 hr tablet Take 600 mg by mouth 2 (two) times daily.  [provider]  ipratropium-albuterol (DUONEB) 0.5-2.5 (3) MG/3ML SOLN INHALE 3 ML BY NEBULIZER EVERY 6 HOURS AS NEEDED 07/09/22   Verlee Monte, MD  levalbuterol Pauline Aus) 1.25 MG/3ML nebulizer solution Take 1.25 mg by nebulization every 4 (four) hours as needed for wheezing. 02/15/22   Hetty Blend, FNP  melatonin 5 MG TABS Take 5 mg by mouth at bedtime.    [provider]  Menthol, Topical Analgesic, 4 % GEL Apply topically. Apply topically as needed to painful joints    [provider]  mirabegron ER (MYRBETRIQ) 25 MG TB24 tablet Take 50 mg by mouth daily. For bladder control    [provider]  montelukast (SINGULAIR) 10 MG tablet Take 1 tablet (10 mg total) by mouth at bedtime.  09/27/14   Coralyn Helling, MD  nystatin (MYCOSTATIN/NYSTOP) powder Apply 1 g topically 4 (four) times daily as needed (for skin folds).     [provider]  omeprazole (PRILOSEC) 40 MG capsule Take 1 capsule (40 mg total) by mouth 2 (two) times daily. 08/15/22   Verlee Monte, MD  polyethylene glycol [facility] Ethelene Hal) packet Take 17 g by mouth daily. Patient taking differently: Take 17 g by mouth daily as needed for moderate constipation. 06/16/14   Moding, Adrian Blackwater, MD  rivaroxaban (XARELTO) 20 MG TABS tablet Take 20 mg by mouth daily with supper.    [provider]  Tiotropium Bromide Monohydrate (SPIRIVA RESPIMAT) 2.5 MCG/ACT AERS Take 1 puff by mouth daily. 08/15/22   Verlee Monte, MD      Allergies    Other, Tape, Trazodone and nefazodone, Celebrex [celecoxib], Latex, Mysoline [primidone], Niacin and related, Sulfa antibiotics, Ciprofloxacin, Gramineae pollens, and Aspirin    Review of Systems   Review of Systems  Constitutional:  Negative for fever.  Gastrointestinal:  Negative for nausea and vomiting.  Musculoskeletal:  Negative for neck pain.  Skin:  Positive for color change.       Abscess  Neurological:  Negative for headaches.  All other systems reviewed and are negative.   Physical Exam Updated Vital Signs BP (!) 144/78   Pulse 74   Temp 98.1 F (36.7 C)   Resp 20   Ht 5\' 5"  (1.651 m)   Wt 106.1 kg   SpO2 93%   BMI 38.94 kg/m  Physical Exam Vitals and nursing note reviewed.  Constitutional:      General: She is not in acute distress.    Appearance: Normal appearance. She is not ill-appearing, toxic-appearing or diaphoretic.  HENT:     Head: Normocephalic and atraumatic.     Nose: Nose normal.     Mouth/Throat:     Mouth: Mucous membranes are moist.     Pharynx: Oropharynx is clear.  Eyes:     Extraocular Movements: Extraocular movements intact.     Conjunctiva/sclera: Conjunctivae normal.     Pupils: Pupils are equal, round, and reactive  to light.  Cardiovascular:     Rate and Rhythm: Normal rate and regular rhythm.  Pulmonary:     Effort: Pulmonary effort is normal.     Breath sounds: Normal breath sounds. No wheezing.  Abdominal:     General: Abdomen is flat.     Tenderness: There is no abdominal tenderness.  Musculoskeletal:     Cervical back: Normal range of motion and neck supple. No tenderness.  Skin:    General: Skin is warm and dry.     Capillary Refill: Capillary refill takes  less than 2 seconds.     Comments: 3 x 4 cm abscess  Neurological:     Mental Status: She is alert and oriented to person, place, and time.          ED Results / Procedures / Treatments   Labs (all labs ordered are listed, but only abnormal results are displayed) Labs Reviewed  CBC - Abnormal; Notable for the following components:      Result Value   Hemoglobin 11.1 (*)    HCT 34.7 (*)    All other components within normal limits  BASIC METABOLIC PANEL - Abnormal; Notable for the following components:   Glucose, Bld 68 (*)    Calcium 8.5 (*)    GFR, Estimated 59 (*)    All other components within normal limits  CBG MONITORING, ED - Abnormal; Notable for the following components:   Glucose-Capillary 56 (*)    All other components within normal limits  CBG MONITORING, ED    EKG None  Radiology No results found.  Procedures .Marland KitchenIncision and Drainage  Date/Time: 11/19/2022 5:56 PM  Performed by: Al Decant, PA-C Authorized by: Al Decant, PA-C   Location:    Type:  Abscess   Size:  3x4   Location:  Head   Head/neck location: L eyebrow. Pre-procedure details:    Skin preparation:  Povidone-iodine Sedation:    Sedation type:  None Anesthesia:    Anesthesia method:  Local infiltration   Local anesthetic:  Lidocaine 2% w/o epi Procedure type:    Complexity:  Simple Procedure details:    Incision types:  Stab incision   Wound management:  Probed and deloculated   Drainage:  Purulent    Drainage amount:  Moderate Post-procedure details:    Procedure completion:  Tolerated well, no immediate complications    Medications Ordered in ED Medications  doxycycline (VIBRA-TABS) tablet 100 mg (has no administration in time range)  lidocaine (XYLOCAINE) 2 % (with pres) injection 200 mg (200 mg Other Given by Other 11/19/22 1839)    ED Course/ Medical Decision Making/ A&P    Medical Decision Making Amount and/or Complexity of Data Reviewed Labs: ordered. Radiology: ordered.  Risk Prescription drug management.   77 year old female presents to the ED for evaluation.  Please see HPI for further details.  On exam patient is about 3 x 4 cm what appears an abscess to her left frontal scalp.  There is no drainage.  There is erythema.  There is fluctuance and induration.  She reports that she is unable to detail if she fell or if this was a spider bite became infected.  She reports that she has a "bad memory".  Due to this, we will collect CT head, CT cervical spine and blood work.  Will also have plans for I&D.  Patient CBC unremarkable.  Metabolic panel unremarkable.  Patient abscess drained per procedure note.  Good amount of purulent material was expressed.  At this time, the patient and her daughter are requesting discharge.  CT cervical spine, head are still pending but she states that they have been waiting an extremity long amount of time and she would like to go home.  Addendum prior to discharge the patient CT scan did come back.  Did not show any acute process.  Stable to discharge home.   Final Clinical Impression(s) / ED Diagnoses Final diagnoses:  Abscess    Rx / DC Orders ED Discharge Orders  Ordered    doxycycline (VIBRAMYCIN) 100 MG capsule  2 times daily        11/19/22 1915                Al Decant, [place] 11/19/22 1933    Sloan Leiter, DO 11/21/22 [phone]

## 2022-11-19 NOTE — ED Notes (Signed)
[employer] [employer] per [employer]

## 2022-11-19 NOTE — Discharge Instructions (Signed)
Please keep recently drained abscess covered for the next 24 hours.  Please begin taking doxycycline twice a day for the next 5 days.  You received your first dose here tonight.  Please follow-up with your PCP for further management evaluation.

## 2022-11-19 NOTE — ED Notes (Signed)
Pt and pt daughter expressed desire to leave before results came back, pt educated that leaving before results could result in worsening condition, pt and pt daughter stated understanding, EDP notified

## 2022-11-19 NOTE — ED Notes (Signed)
Primary nurse removed 20 G IV from Right AC, IV placement was not charted in pt chart. IV was intact upon removal, gauze dressing applied

## 2022-11-23 ENCOUNTER — Telehealth: Payer: Self-pay | Admitting: Internal Medicine

## 2022-11-23 NOTE — Telephone Encounter (Signed)
Left voicemail to give the office a call back to schedule Tezspire reapproval appointment before 12/31.

## 2022-11-26 ENCOUNTER — Ambulatory Visit: Payer: Self-pay

## 2022-11-26 ENCOUNTER — Ambulatory Visit (INDEPENDENT_AMBULATORY_CARE_PROVIDER_SITE_OTHER): Payer: 59

## 2022-11-26 DIAGNOSIS — J4550 Severe persistent asthma, uncomplicated: Secondary | ICD-10-CM | POA: Diagnosis not present

## 2022-11-27 ENCOUNTER — Ambulatory Visit (INDEPENDENT_AMBULATORY_CARE_PROVIDER_SITE_OTHER): Payer: 59 | Admitting: Internal Medicine

## 2022-11-27 ENCOUNTER — Encounter: Payer: Self-pay | Admitting: Internal Medicine

## 2022-11-27 VITALS — BP 130/76 | HR 64 | Temp 97.5°F | Resp 16 | Ht 63.0 in | Wt 234.0 lb

## 2022-11-27 DIAGNOSIS — G4733 Obstructive sleep apnea (adult) (pediatric): Secondary | ICD-10-CM

## 2022-11-27 DIAGNOSIS — I482 Chronic atrial fibrillation, unspecified: Secondary | ICD-10-CM | POA: Diagnosis not present

## 2022-11-27 DIAGNOSIS — I509 Heart failure, unspecified: Secondary | ICD-10-CM

## 2022-11-27 DIAGNOSIS — J310 Chronic rhinitis: Secondary | ICD-10-CM

## 2022-11-27 DIAGNOSIS — J4489 Other specified chronic obstructive pulmonary disease: Secondary | ICD-10-CM

## 2022-11-27 DIAGNOSIS — K219 Gastro-esophageal reflux disease without esophagitis: Secondary | ICD-10-CM

## 2022-11-27 DIAGNOSIS — J4550 Severe persistent asthma, uncomplicated: Secondary | ICD-10-CM | POA: Diagnosis not present

## 2022-11-27 MED ORDER — MONTELUKAST SODIUM 10 MG PO TABS: 10.0000 mg | ORAL_TABLET | Freq: Every day | ORAL | 5 refills | Status: DC

## 2022-11-27 MED ORDER — OMEPRAZOLE 40 MG PO CPDR: 40.0000 mg | DELAYED_RELEASE_CAPSULE | Freq: Every day | ORAL | 5 refills | Status: DC

## 2022-11-27 MED ORDER — FLUTICASONE-SALMETEROL 500-50 MCG/ACT IN AEPB: 1.0000 | INHALATION_SPRAY | Freq: Two times a day (BID) | RESPIRATORY_TRACT | 3 refills | Status: DC

## 2022-11-27 MED ORDER — BUDESONIDE 0.5 MG/2ML IN SUSP: 0.5000 mg | Freq: Two times a day (BID) | RESPIRATORY_TRACT | 5 refills | Status: AC

## 2022-11-27 MED ORDER — ENSIFENTRINE 3 MG/2.5ML IN SUSP: 1.0000 | Freq: Two times a day (BID) | RESPIRATORY_TRACT | 5 refills | Status: DC

## 2022-11-27 MED ORDER — SPIRIVA RESPIMAT 2.5 MCG/ACT IN AERS: 1.0000 | INHALATION_SPRAY | Freq: Every day | RESPIRATORY_TRACT | 5 refills | Status: DC

## 2022-11-27 NOTE — Progress Notes (Signed)
FOLLOW UP Date of Service/Encounter:  11/27/22  Subjective:  Elaine Lee (DOB: [birth year]) is a 77 y.o. female [facility] in re-evaluation of the following: asthma/copd, allergic rhinitis History obtained from: chart review and patient.  For Review, LV was on 08/15/22  with Dr.Hadeel Hillebrand seen for routine follow-up. See below for summary of history and diagnostics.  ----------------------------------------------------- Pertinent History/Diagnostics:  Asthma/COPD: moderate-severe, comorbid GERD, OSA on CPAP complicated medical history with congestive heart failure, diabetes, chronic kidney disease, COPD, reflux and obesity   - on wixela 500 mcg, spiriva 1.3mcg, montelulast, added prednisone 10 mg daily (07/04/22). Stopped 08/15/22.  - severe restriction/obstruction spirometry (11/30/21): ratio 66, 0.80 L, 39% FEV1 (pre),   - Fasenra started 03/02/19 07/04/22: we did start her on low dose prednisone to help control asthma, she did much better but starting having heart issues, so this was stopped. We appealed her tezspire denial. We referred her to pulmonary. - Tezspire approved August 2024. Allergic Rhinitis:  - SPT environmental panel (11/06/2018): mold mix 4 Other: Congestive heart failure --------------------------------------------------- Today presents for follow-up. Discussed the use of AI scribe software for clinical note transcription with the patient, who gave verbal consent to proceed.  History of Present Illness   The patient, with a history of asthma, heart failure, and sleep apnea, presents with persistent wheezing and a chronic cough that has been present for about a year. She reports no improvement in symptoms despite being on Tezspire, a medication switch from [place]. The patient also mentions a recent steroid injection in the knee and a completed course of antibiotics for an abscess since last visit.  The patient has been  using a nebulizer machine, typically in the mornings before breakfast, and reports falling asleep with it on at night. She is unsure which meds she is using in the machine. She also mentions using Wixela, but it's unclear how regularly.  The patient has been seen by a lung doctor for sleep apnea and was advised to use her CPAP machine. However, she expresses dissatisfaction with the treatment, citing difficulty using the machine due to her chronic cough. Nasal CPAP mentioned but has not been further explored.  The patient also mentions a history of living in a house with black mold and a potential exposure to chemical plant toxins in her hometown in [place]. She has been advised by a family member to move out of her current home due to potential allergens, but is resistant to the idea.  The patient also reports a recent heart monitor test after discontinuing her heart medication to assess her atrial fibrillation. She is scheduled to see a rhythm doctor for further evaluation of her heart condition as she continues to have issues with atrial fibrillatoin.  The patient denies any need for steroids and reports no smoking history, although she mentions that others in her house have smoked. She also reports some hearing loss, describing a constant sound in her head like a leaf blower. She has not seen an ENT specialist, only an audiologist for hearing aids which she can not purchase until January due to insurance. She lost one of her aids this year.   The patient's symptoms appear to be complex and multifactorial, with potential contributions from her heart failure, lung conditions. Despite these challenges, the patient maintains an active lifestyle, including recent travel but limited physical activities.      Chart Review: Seen by [facility] 08/30/22 for OSA, her other  pulmonary needs were not addressed Last tezspire injection was 11/26/22. ED visit 11/19/22-I&D performed for abscess to  frontal scalp. ED visit 08/27/22 for folliculitis: treated for cellulitis  All medications reviewed by clinical staff and updated in chart. No new pertinent medical or surgical history except as noted in HPI.  ROS: All others negative except as noted per HPI.   Objective:  BP 130/76 (BP Location: Right Arm, Patient Position: Sitting, Cuff Size: Large)   Pulse 64   Temp (!) 97.5 F (36.4 C) (Temporal)   Resp 16   Ht 5\' 3"  (1.6 m)   Wt 234 lb (106.1 kg)   SpO2 96%   BMI 41.45 kg/m  Body mass index is 41.45 kg/m. Physical Exam: General Appearance:  Alert, cooperative, no distress, appears stated age  Head:  Normocephalic, without obvious abnormality, atraumatic  Eyes:  Conjunctiva clear, EOM's intact  Ears EACs normal bilaterally and normal TMs bilaterally  Nose: Nares normal, hypertrophic turbinates, normal mucosa, and no visible anterior polyps  Throat: Lips, tongue normal; teeth and gums normal, normal posterior oropharynx  Neck: Supple, symmetrical  Lungs:   Wheezing and rales throughout , Respirations unlabored,  frequent productive cough  Heart:  regular rate and rhythm and no murmur, Appears well perfused  Extremities: No edema  Skin: Skin color, texture, turgor normal and no rashes or lesions on visualized portions of skin  Neurologic: No gross deficits   Labs:  Lab Orders  No laboratory test(s) ordered today    Spirometry:  Tracings reviewed. Her effort: Good reproducible efforts. FVC: 1.47L FEV1: 1.02L, 54% predicted FEV1/FVC ratio: 0.69 Interpretation: Spirometry consistent with possible restrictive disease.  Please see scanned spirometry results for details.  Assessment/Plan   Asthma/COPD-not at goal Spirometry shows restriction. Persistent despite current treatment with Tezspire and Wixela and budesonide. No improvement noted with switch from [place] to [place]. Possible contributing factors include heart failure. Needs pulmonary input. Follows with  cardiology already. Controller meds:  Continue montelukast 10 mg once a day to prevent cough or wheeze Continue Wixela 500-1 puff twice a day to prevent cough or wheeze Continue Spiriva 1.25 mcg 2 puffs once a day to prevent cough and wheeze Continue Budesonide 0.5 mg (1 vial) in nebulizer machine twice daily Add on Ensifentrine 1 vial twice a day via nebulizer.  Follow-up with Pulmonary-will refer you to a lung doctor who will see you for your breathing problems.   Continue Tezspire per protocol.  Rescue meds: AS NEEDED Continue albuterol 2 puffs or 1 vial via nebulizer once every 4 hours as needed for cough or wheeze You may use albuterol 2 puffs 5 to 15 minutes before activity to decrease cough or wheeze  Sick Plan:  Start Albuterol or Duonebs (1 vial) via nebulizer every 4 to 6 hours while awake for 2-3 days Schedule follow-up  Congestive Heart Failure: stable - please continue to follow-up with Cardiology and follow your weights  Allergic rhinitis-not at goal Do not suspect her current living arrangements are a significant contributor to her symptoms, as do not suspect allergies are her principal concern.  Would not recommend changing homes based on the presence of mold in the home.  This is a very common finding.  If she is concerned, mold remediation should suffice. Continue allergen avoidance measures to mold as listed below Continue Atrovent nasal spray 2 sprays in each nostril twice a day as needed for runny nose Continue azelastine nasal spray 2 sprays in each nostril twice a day as needed  for runny nose Consider saline nasal rinses as needed for nasal symptoms. Use this before any medicated nasal sprays for best result   Reflux-stable Continue dietary and lifestyle modifications as listed below Continue omeprazole 40 mg once daily  Obstructive sleep apnea-not at goal Continue to follow with pulmonary and highly recommend restarting CPAP as prescribed  Talk to your  pulmonologist about nasal CPAP  Tinnitus and Hearing loss: not at goal Discussed need for new hearing aids Consider ENT referral  Follow up: 12 weeks, sooner if needed.  Thank you so much for letting me partake in your care today.   Don't hesitate to reach out if you have any additional concerns!  Other: none  Tonny Bollman, MD  [facility] [place]

## 2022-11-27 NOTE — Patient Instructions (Addendum)
Asthma/COPD-not at goal Spirometry shows restriction. Controller meds:  Continue montelukast 10 mg once a day to prevent cough or wheeze Continue Wixela 500-1 puff twice a day to prevent cough or wheeze Continue Spiriva 1.25 mcg 2 puffs once a day to prevent cough and wheeze Continue Budesonide 0.5 mg (1 vial) in nebulizer machine twice daily Add on Ensifentrine 1 vial twice a day via nebulizer.  Follow-up with Pulmonary-will refer you to a lung doctor who will see you for your breathing problems.   Continue Tezspire per protocol.  Rescue meds: AS NEEDED Continue albuterol 2 puffs or 1 vial via nebulizer once every 4 hours as needed for cough or wheeze You may use albuterol 2 puffs 5 to 15 minutes before activity to decrease cough or wheeze  Sick Plan:  Start Albuterol or Duonebs (1 vial) via nebulizer every 4 to 6 hours while awake for 2-3 days Schedule follow-up  Congestive Heart Failure:  - please continue to follow-up with Cardiology and follow your weights  Allergic rhinitis Continue allergen avoidance measures to mold as listed below Continue Atrovent nasal spray 2 sprays in each nostril twice a day as needed for runny nose Continue azelastine nasal spray 2 sprays in each nostril twice a day as needed for runny nose Consider saline nasal rinses as needed for nasal symptoms. Use this before any medicated nasal sprays for best result   Reflux Continue dietary and lifestyle modifications as listed below Continue omeprazole 40 mg once daily  Obstructive sleep apnea Continue to follow with pulmonary and highly recommend restarting CPAP as prescribed  Talk to your pulmonologist about nasal CPAP  Follow up: 12 weeks, sooner if needed.  Thank you so much for letting me partake in your care today.   Don't hesitate to reach out if you have any additional concerns!  Tonny Bollman, MD [facility]

## 2022-11-28 NOTE — Telephone Encounter (Signed)
Patient was seen yesterday and Mel was letting us know that she had received on November 4th from an outside pharmacy flu,pneumonia,rsv and September 25th covid 19,shingles.

## 2022-12-03 ENCOUNTER — Telehealth: Payer: Self-pay | Admitting: Internal Medicine

## 2022-12-03 MED ORDER — ENSIFENTRINE 3 MG/2.5ML IN SUSP: 2.5000 mL | Freq: Two times a day (BID) | RESPIRATORY_TRACT | 5 refills | Status: DC | PRN

## 2022-12-03 NOTE — Telephone Encounter (Signed)
Add on Ensifentrine 1 vial twice a day via nebulizer.  Sent to pharmacy for pt

## 2022-12-03 NOTE — Telephone Encounter (Signed)
Patient called and stated she was in the office last week and was supposed to be called in something for the pharmacy and nothing is there and requested a call back at [phone].

## 2022-12-12 ENCOUNTER — Telehealth: Payer: Self-pay | Admitting: Internal Medicine

## 2022-12-12 NOTE — Telephone Encounter (Signed)
Can you look into this for me it looks like provideer placed the ordered for referral on 11/26

## 2022-12-12 NOTE — Progress Notes (Signed)
Patient was scheduled to see Dr. Judeth Horn with [facility] on 09/20/2022. She no showed this appointment. I will send another referral over and see if they will reschedule the patient or not.

## 2022-12-12 NOTE — Telephone Encounter (Signed)
Patient called and stated they wanted her to see a lung doctor but she has not received a phone call and requested a call back.

## 2022-12-13 NOTE — Telephone Encounter (Addendum)
Referral was placed back to [facility]. Patient no showed her new patient visit with them in September. I called and left her a voicemail to contact them back to get rescheduled. They also sent her a letter with their no show policy and to contact them to get rescheduled.   [facility] [address] [place],  [place]  [address] Main: [phone]  I called and left a voicemail for the patient on her home phone. I called the cell phone and the voicemail box is full.

## 2022-12-24 ENCOUNTER — Ambulatory Visit (INDEPENDENT_AMBULATORY_CARE_PROVIDER_SITE_OTHER): Payer: 59

## 2022-12-24 DIAGNOSIS — J4550 Severe persistent asthma, uncomplicated: Secondary | ICD-10-CM | POA: Diagnosis not present

## 2023-01-09 ENCOUNTER — Other Ambulatory Visit: Payer: Self-pay | Admitting: *Deleted

## 2023-01-09 MED ORDER — TEZSPIRE 210 MG/1.91ML ~~LOC~~ SOSY: 210.0000 mg | PREFILLED_SYRINGE | SUBCUTANEOUS | 11 refills | Status: AC

## 2023-01-21 ENCOUNTER — Ambulatory Visit: Payer: 59

## 2023-01-25 ENCOUNTER — Ambulatory Visit (INDEPENDENT_AMBULATORY_CARE_PROVIDER_SITE_OTHER): Payer: 59

## 2023-01-25 DIAGNOSIS — J4550 Severe persistent asthma, uncomplicated: Secondary | ICD-10-CM | POA: Diagnosis not present

## 2023-02-22 ENCOUNTER — Ambulatory Visit: Payer: 59

## 2023-02-27 ENCOUNTER — Encounter: Payer: Self-pay | Admitting: Internal Medicine

## 2023-02-27 ENCOUNTER — Other Ambulatory Visit: Payer: Self-pay

## 2023-02-27 ENCOUNTER — Ambulatory Visit (INDEPENDENT_AMBULATORY_CARE_PROVIDER_SITE_OTHER): Payer: 59 | Admitting: Internal Medicine

## 2023-02-27 VITALS — BP 128/60 | HR 72 | Temp 97.2°F | Resp 16

## 2023-02-27 DIAGNOSIS — J4489 Other specified chronic obstructive pulmonary disease: Secondary | ICD-10-CM

## 2023-02-27 DIAGNOSIS — J3089 Other allergic rhinitis: Secondary | ICD-10-CM

## 2023-02-27 DIAGNOSIS — K219 Gastro-esophageal reflux disease without esophagitis: Secondary | ICD-10-CM

## 2023-02-27 DIAGNOSIS — I509 Heart failure, unspecified: Secondary | ICD-10-CM

## 2023-02-27 DIAGNOSIS — J4550 Severe persistent asthma, uncomplicated: Secondary | ICD-10-CM | POA: Diagnosis not present

## 2023-02-27 DIAGNOSIS — G4733 Obstructive sleep apnea (adult) (pediatric): Secondary | ICD-10-CM

## 2023-02-27 MED ORDER — AZELASTINE HCL 0.1 % NA SOLN: 2.0000 | Freq: Two times a day (BID) | NASAL | 5 refills | Status: DC | PRN

## 2023-02-27 MED ORDER — SPIRIVA RESPIMAT 2.5 MCG/ACT IN AERS: 1.0000 | INHALATION_SPRAY | Freq: Every day | RESPIRATORY_TRACT | 5 refills | Status: DC

## 2023-02-27 MED ORDER — LEVALBUTEROL HCL 1.25 MG/3ML IN NEBU: 1.2500 mg | INHALATION_SOLUTION | RESPIRATORY_TRACT | 5 refills | Status: DC | PRN

## 2023-02-27 MED ORDER — LEVALBUTEROL TARTRATE 45 MCG/ACT IN AERO: 2.0000 | INHALATION_SPRAY | RESPIRATORY_TRACT | 3 refills | Status: DC | PRN

## 2023-02-27 MED ORDER — MONTELUKAST SODIUM 10 MG PO TABS: 10.0000 mg | ORAL_TABLET | Freq: Every day | ORAL | 5 refills | Status: DC

## 2023-02-27 MED ORDER — OMEPRAZOLE 40 MG PO CPDR: 40.0000 mg | DELAYED_RELEASE_CAPSULE | Freq: Every day | ORAL | 5 refills | Status: DC

## 2023-02-27 MED ORDER — ENSIFENTRINE 3 MG/2.5ML IN SUSP: 1.0000 | Freq: Two times a day (BID) | RESPIRATORY_TRACT | 5 refills | Status: DC

## 2023-02-27 MED ORDER — FLUTICASONE-SALMETEROL 500-50 MCG/ACT IN AEPB: 1.0000 | INHALATION_SPRAY | Freq: Two times a day (BID) | RESPIRATORY_TRACT | 3 refills | Status: DC

## 2023-02-27 NOTE — Patient Instructions (Addendum)
 Asthma/COPD-not at goal Spirometry shows moderate restriction. Persistent despite current treatment with Tezspire and Wixela and budesonide. No improvement noted with switch from [place] to [place]. Possible contributing factors include heart failure. Needs pulmonary input. Follows with cardiology already. Recent pacemaker placed. Spirometry looks better today. Controller meds:  Continue montelukast 10 mg once a day to prevent cough or wheeze Continue Wixela 500-1 puff twice a day to prevent cough or wheeze Continue Spiriva 1.25 mcg 2 puffs once a day to prevent cough and wheeze Continue Budesonide 0.5 mg (1 vial) in nebulizer machine twice daily Continue Ensifentrine 1 vial twice a day via nebulizer.  Follow-up with Pulmonary-will refer you to a lung doctor who will see you for your breathing problems.   Continue Tezspire per protocol.  Rescue meds: AS NEEDED Continue levalbuterol 2 puffs or 1 vial via nebulizer once every 4 hours as needed for cough or wheeze You may use albuterol 2 puffs 5 to 15 minutes before activity to decrease cough or wheeze  Sick Plan:  Start Albuterol or Duonebs (1 vial) via nebulizer every 4 to 6 hours while awake for 2-3 days Schedule follow-up  Congestive Heart Failure:  - please continue to follow-up with Cardiology and follow your weights  Allergic rhinitis Continue allergen avoidance measures to mold as listed below Continue azelastine nasal spray 2 sprays in each nostril twice a day as needed for runny nose Consider saline nasal rinses as needed for nasal symptoms. Use this before any medicated nasal sprays for best result   Reflux Continue dietary and lifestyle modifications as listed below Continue omeprazole 40 mg once daily  Obstructive sleep apnea Continue to follow with pulmonary and highly recommend restarting CPAP as prescribed  Talk to your pulmonologist about nasal CPAP  Follow up: 6 months, sooner if needed. I will look into what  happened with your lung doctor referral.   Thank you so much for letting me partake in your care today.   Don't hesitate to reach out if you have any additional concerns!  Tonny Bollman, MD [facility]

## 2023-02-27 NOTE — Progress Notes (Signed)
 FOLLOW UP Date of Service/Encounter:  02/27/23  Subjective:  Elaine Lee (DOB: 05/27/1945) is a 78 y.o. female who [facility]23 in re-evaluation of the following: asthma/copd, allergic rhinitis, congestive heart failure History obtained from: chart review and patient.  For Review, LV was on 11/27/22  with Dr.Greidy Sherard seen for routine follow-up. See below for summary of history and diagnostics.   Therapeutic plans/changes recommended:  Spirometry showed restriction. Still not doing well. Again referred to pulmonary. We added Ensifentrine to her current regimen. Allergic rhinitis-not at goal Do not suspect her current living arrangements are a significant contributor to her symptoms, as do not suspect allergies are her principal concern.  Would not recommend changing homes based on the presence of mold in the home.  This is a very common finding.  If she is concerned, mold remediation should suffice. ----------------------------------------------------- Pertinent History/Diagnostics:  Asthma/COPD: moderate-severe, comorbid GERD, OSA on CPAP complicated medical history with congestive heart failure, diabetes, chronic kidney disease, COPD, reflux and obesity   - on wixela 500 mcg, spiriva 1.53mcg, montelulast, added prednisone 10 mg daily (07/04/22). Stopped 08/15/22.  - severe restriction/obstruction spirometry (11/30/21): ratio 66, 0.80 L, 39% FEV1 (pre),   - Fasenra started 03/02/19 07/04/22: we did start her on low dose prednisone to help control asthma, she did much better but starting having heart issues, so this was stopped. We appealed her tezspire denial. We referred her to pulmonary. - Tezspire approved August 2024. Allergic Rhinitis:  - SPT environmental panel (11/06/2018): mold mix 4 Other: Congestive heart failure with pacemaker --------------------------------------------------- Today presents for follow-up. Discussed the use of  AI scribe software for clinical note transcription with the patient, who gave verbal consent to proceed.  History of Present Illness   Elaine Lee "Mel" is a 78 year old female with atrial fibrillation and asthma/COPD who presents for follow-up on her respiratory and cardiac conditions.  She experiences ongoing episodes of atrial fibrillation, which she manages by sitting through them. She has been on various medications and now has a pacemaker, which requires her to use her albuterol infrequently.  She has a history of chronic obstructive pulmonary disease and uses a CPAP machine, although she struggles with the mask staying on at night.  She continues with her daily cough, and after speaking with her it is unclear exactly how she is using her allergy medications.  She does have Wixela, but unsure if she is using it twice daily.  She does report using her Spiriva daily.  She has both nebulizer solutions that we sent last visit, but reports only using them as needed.  She experiences nasal drainage and uses over-the-counter nasal sprays, including Mucinex, but is not using either Atrovent or azelastine nasal sprays she takes Pepcid twice a day for reflux and has adjusted her sleeping arrangements to improve her breathing.  She has lost approximately forty pounds recently and is in the process of selling her house due to concerns about black mold, planning to move into an apartment. She recently received new hearing aids after issues with her previous ones.   She has still not followed up with pulmonary.  She reports that she never heard back regarding her referral.     Chart Review: Her last visit to cardiology office was 02/25/2023 where she had a pacemaker check.  All medications reviewed by clinical staff and updated in chart. No new pertinent medical or surgical history except as noted in HPI.  ROS: All others negative except as noted per HPI.   Objective:  BP 128/60 (BP  Location: Right Arm, Patient Position: Sitting, Cuff Size: Large)   Pulse 72   Temp (!) 97.2 F (36.2 C) (Temporal)   Resp 16   SpO2 96%  There is no height or weight on file to calculate BMI. Physical Exam: General Appearance:  Alert, cooperative, no distress, appears stated age  Head:  Normocephalic, without obvious abnormality, atraumatic  Eyes:  Conjunctiva clear, EOM's intact  Ears EACs normal bilaterally and normal TMs bilaterally  Nose: Nares normal, normal mucosa and no visible anterior polyps  Throat: Lips, tongue normal; teeth and gums normal, normal posterior oropharynx  Neck: Supple, symmetrical  Lungs:   clear to auscultation bilaterally, Respirations unlabored, no coughing  Heart:  regular rate and rhythm and no murmur, Appears well perfused  Extremities: No edema  Skin: Skin color, texture, turgor normal and no rashes or lesions on visualized portions of skin  Neurologic: No gross deficits   Labs:  Lab Orders  No laboratory test(s) ordered today    Spirometry:  Tracings reviewed. Her effort: Good reproducible efforts. FVC: 1.61L FEV1: 1.19L, 64% predicted FEV1/FVC ratio: 0.74 Interpretation: Spirometry consistent with possible restrictive disease.  Please see scanned spirometry results for details.  Assessment/Plan   Asthma/COPD-not at goal Spirometry shows moderate restriction. Persistent despite current treatment with Tezspire and Wixela and budesonide. No improvement noted with switch from [place] to [place]. Possible contributing factors include heart failure. Needs pulmonary input. Follows with cardiology already. Recent pacemaker placed. Spirometry looks better today. Controller meds:  Continue montelukast 10 mg once a day to prevent cough or wheeze Continue Wixela 500-1 puff twice a day to prevent cough or wheeze Continue Spiriva 1.25 mcg 2 puffs once a day to prevent cough and wheeze Continue Budesonide 0.5 mg (1 vial) in nebulizer machine twice  daily Continue Ensifentrine 1 vial twice a day via nebulizer.  Follow-up with Pulmonary-will refer you to a lung doctor who will see you for your breathing problems.   Continue Tezspire per protocol.  Rescue meds: AS NEEDED Continue levalbuterol 2 puffs or 1 vial via nebulizer once every 4 hours as needed for cough or wheeze You may use albuterol 2 puffs 5 to 15 minutes before activity to decrease cough or wheeze  Sick Plan:  Start Albuterol or Duonebs (1 vial) via nebulizer every 4 to 6 hours while awake for 2-3 days Schedule follow-up  Congestive Heart Failure: and A. Fib-now with pacemaker. - please continue to follow-up with Cardiology and follow your weights  Allergic rhinitis-not at goal Continue allergen avoidance measures to mold as listed below Continue azelastine nasal spray 2 sprays in each nostril twice a day as needed for runny nose Consider saline nasal rinses as needed for nasal symptoms. Use this before any medicated nasal sprays for best result   Reflux-at goal Continue dietary and lifestyle modifications as listed below Continue omeprazole 40 mg once daily  Obstructive sleep apnea-not at goal Continue to follow with pulmonary and highly recommend restarting CPAP as prescribed  Talk to your pulmonologist about nasal CPAP  Follow up: 6 months, sooner if needed. I will look into what happened with your lung doctor referral.   Thank you so much for letting me partake in your care today.   Don't hesitate to reach out if you have any additional concerns!  Other: Tezspire given in clinic today.  Tonny Bollman, MD  [facility] [place]  [place]

## 2023-02-28 NOTE — Addendum Note (Signed)
 Addended by: Lynnae Sandhoff, Hilda Lias E on: 02/28/2023 05:21 PM   Modules accepted: Orders

## 2023-03-04 ENCOUNTER — Other Ambulatory Visit: Payer: Self-pay

## 2023-03-04 MED ORDER — ENSIFENTRINE 3 MG/2.5ML IN SUSP: 1.0000 | Freq: Two times a day (BID) | RESPIRATORY_TRACT | 5 refills | Status: AC

## 2023-03-04 MED ORDER — SPIRIVA RESPIMAT 2.5 MCG/ACT IN AERS: 1.0000 | INHALATION_SPRAY | Freq: Every day | RESPIRATORY_TRACT | 5 refills | Status: AC

## 2023-03-04 MED ORDER — LEVALBUTEROL HCL 1.25 MG/3ML IN NEBU: 1.2500 mg | INHALATION_SOLUTION | RESPIRATORY_TRACT | 5 refills | Status: AC | PRN

## 2023-03-04 MED ORDER — OMEPRAZOLE 40 MG PO CPDR: 40.0000 mg | DELAYED_RELEASE_CAPSULE | Freq: Every day | ORAL | 5 refills | Status: AC

## 2023-03-04 MED ORDER — LEVALBUTEROL TARTRATE 45 MCG/ACT IN AERO: 2.0000 | INHALATION_SPRAY | RESPIRATORY_TRACT | 3 refills | Status: AC | PRN

## 2023-03-04 MED ORDER — MONTELUKAST SODIUM 10 MG PO TABS: 10.0000 mg | ORAL_TABLET | Freq: Every day | ORAL | 5 refills | Status: AC

## 2023-03-04 MED ORDER — AZELASTINE HCL 0.1 % NA SOLN: 2.0000 | Freq: Two times a day (BID) | NASAL | 5 refills | Status: AC | PRN

## 2023-03-04 MED ORDER — FLUTICASONE-SALMETEROL 500-50 MCG/ACT IN AEPB: 1.0000 | INHALATION_SPRAY | Freq: Two times a day (BID) | RESPIRATORY_TRACT | 3 refills | Status: AC

## 2023-03-26 ENCOUNTER — Ambulatory Visit

## 2023-03-26 DIAGNOSIS — J4550 Severe persistent asthma, uncomplicated: Secondary | ICD-10-CM | POA: Diagnosis not present

## 2023-04-23 ENCOUNTER — Ambulatory Visit

## 2023-05-08 ENCOUNTER — Ambulatory Visit

## 2023-05-08 DIAGNOSIS — J4550 Severe persistent asthma, uncomplicated: Secondary | ICD-10-CM

## 2023-06-05 ENCOUNTER — Ambulatory Visit

## 2023-08-28 ENCOUNTER — Ambulatory Visit: Payer: 59 | Admitting: Internal Medicine

## 2023-11-27 ENCOUNTER — Telehealth: Payer: Self-pay | Admitting: Internal Medicine

## 2023-11-27 NOTE — Telephone Encounter (Signed)
 Left voicemail to give the office a call back to schedule Tezspire reapproval appointment.

## 2024-02-04 ENCOUNTER — Other Ambulatory Visit: Payer: Self-pay | Admitting: Internal Medicine
